# Patient Record
Sex: Female | Born: 1935 | Race: White | Hispanic: No | Smoking: Never smoker
Health system: Southern US, Community
[De-identification: ages and names within clinical notes are randomized; demographics above are authoritative.]

## PROBLEM LIST (undated history)

## (undated) DIAGNOSIS — M810 Age-related osteoporosis without current pathological fracture: Secondary | ICD-10-CM

## (undated) DIAGNOSIS — J45909 Unspecified asthma, uncomplicated: Secondary | ICD-10-CM

## (undated) DIAGNOSIS — D509 Iron deficiency anemia, unspecified: Secondary | ICD-10-CM

## (undated) DIAGNOSIS — I10 Essential (primary) hypertension: Secondary | ICD-10-CM

## (undated) DIAGNOSIS — I480 Paroxysmal atrial fibrillation: Secondary | ICD-10-CM

## (undated) DIAGNOSIS — K219 Gastro-esophageal reflux disease without esophagitis: Secondary | ICD-10-CM

## (undated) HISTORY — DX: Gastro-esophageal reflux disease without esophagitis: K21.9

## (undated) HISTORY — PX: OTHER SURGICAL HISTORY: SHX169

## (undated) HISTORY — DX: Iron deficiency anemia, unspecified: D50.9

## (undated) HISTORY — DX: Paroxysmal atrial fibrillation: I48.0

## (undated) HISTORY — DX: Essential (primary) hypertension: I10

## (undated) HISTORY — DX: Age-related osteoporosis without current pathological fracture: M81.0

---

## 2005-04-24 ENCOUNTER — Ambulatory Visit: Payer: Self-pay | Admitting: Internal Medicine

## 2005-05-03 ENCOUNTER — Ambulatory Visit: Payer: Self-pay | Admitting: Internal Medicine

## 2005-05-05 ENCOUNTER — Ambulatory Visit: Payer: Self-pay | Admitting: Unknown Physician Specialty

## 2005-06-26 ENCOUNTER — Ambulatory Visit: Payer: Self-pay | Admitting: Internal Medicine

## 2005-08-21 ENCOUNTER — Ambulatory Visit: Payer: Self-pay | Admitting: Neurosurgery

## 2005-11-23 ENCOUNTER — Ambulatory Visit: Payer: Self-pay | Admitting: Cardiology

## 2006-02-26 ENCOUNTER — Ambulatory Visit: Payer: Self-pay | Admitting: Ophthalmology

## 2006-03-05 ENCOUNTER — Ambulatory Visit: Payer: Self-pay | Admitting: Ophthalmology

## 2006-09-05 ENCOUNTER — Ambulatory Visit: Payer: Self-pay | Admitting: Internal Medicine

## 2007-11-27 ENCOUNTER — Ambulatory Visit: Payer: Self-pay | Admitting: Internal Medicine

## 2008-05-04 ENCOUNTER — Ambulatory Visit: Payer: Self-pay | Admitting: Internal Medicine

## 2008-12-25 ENCOUNTER — Ambulatory Visit: Payer: Self-pay | Admitting: Ophthalmology

## 2009-01-04 ENCOUNTER — Ambulatory Visit: Payer: Self-pay | Admitting: Ophthalmology

## 2009-03-09 ENCOUNTER — Ambulatory Visit: Payer: Self-pay | Admitting: Internal Medicine

## 2009-05-20 ENCOUNTER — Ambulatory Visit: Payer: Self-pay | Admitting: Unknown Physician Specialty

## 2010-03-01 ENCOUNTER — Encounter: Payer: Self-pay | Admitting: Internal Medicine

## 2010-03-18 ENCOUNTER — Encounter: Payer: Self-pay | Admitting: Internal Medicine

## 2010-04-07 ENCOUNTER — Ambulatory Visit: Payer: Self-pay | Admitting: Internal Medicine

## 2010-04-18 ENCOUNTER — Encounter: Payer: Self-pay | Admitting: Internal Medicine

## 2010-05-19 ENCOUNTER — Encounter: Payer: Self-pay | Admitting: Internal Medicine

## 2010-05-30 ENCOUNTER — Ambulatory Visit: Payer: Self-pay | Admitting: Internal Medicine

## 2010-06-18 ENCOUNTER — Encounter: Payer: Self-pay | Admitting: Internal Medicine

## 2010-07-19 ENCOUNTER — Encounter: Payer: Self-pay | Admitting: Internal Medicine

## 2010-08-18 ENCOUNTER — Encounter: Payer: Self-pay | Admitting: Internal Medicine

## 2010-09-18 ENCOUNTER — Encounter: Payer: Self-pay | Admitting: Internal Medicine

## 2010-10-19 ENCOUNTER — Encounter: Payer: Self-pay | Admitting: Internal Medicine

## 2011-07-19 ENCOUNTER — Ambulatory Visit: Payer: Self-pay | Admitting: Internal Medicine

## 2012-07-24 ENCOUNTER — Ambulatory Visit: Payer: Self-pay | Admitting: Internal Medicine

## 2013-07-30 ENCOUNTER — Ambulatory Visit: Payer: Self-pay | Admitting: Internal Medicine

## 2013-11-16 ENCOUNTER — Inpatient Hospital Stay: Payer: Self-pay | Admitting: Orthopedic Surgery

## 2013-11-16 LAB — URINALYSIS, COMPLETE
BILIRUBIN, UR: NEGATIVE
BLOOD: NEGATIVE
Glucose,UR: NEGATIVE mg/dL (ref 0–75)
Ketone: NEGATIVE
Nitrite: NEGATIVE
PH: 6 (ref 4.5–8.0)
PROTEIN: NEGATIVE
RBC,UR: 1 /HPF (ref 0–5)
Specific Gravity: 1.012 (ref 1.003–1.030)

## 2013-11-16 LAB — BASIC METABOLIC PANEL
ANION GAP: 4 — AB (ref 7–16)
BUN: 16 mg/dL (ref 7–18)
CHLORIDE: 104 mmol/L (ref 98–107)
CO2: 27 mmol/L (ref 21–32)
CREATININE: 0.7 mg/dL (ref 0.60–1.30)
Calcium, Total: 9.2 mg/dL (ref 8.5–10.1)
Glucose: 96 mg/dL (ref 65–99)
Osmolality: 271 (ref 275–301)
POTASSIUM: 4.2 mmol/L (ref 3.5–5.1)
Sodium: 135 mmol/L — ABNORMAL LOW (ref 136–145)

## 2013-11-16 LAB — PROTIME-INR
INR: 1.1
Prothrombin Time: 13.9 secs (ref 11.5–14.7)

## 2013-11-16 LAB — CBC
HCT: 31.9 % — AB (ref 35.0–47.0)
HGB: 10.9 g/dL — AB (ref 12.0–16.0)
MCH: 32.1 pg (ref 26.0–34.0)
MCHC: 34.1 g/dL (ref 32.0–36.0)
MCV: 94 fL (ref 80–100)
Platelet: 254 10*3/uL (ref 150–440)
RBC: 3.39 10*6/uL — ABNORMAL LOW (ref 3.80–5.20)
RDW: 14.4 % (ref 11.5–14.5)
WBC: 7.9 10*3/uL (ref 3.6–11.0)

## 2013-11-16 LAB — IRON AND TIBC
IRON BIND. CAP.(TOTAL): 401 ug/dL (ref 250–450)
Iron Saturation: 29 %
Iron: 115 ug/dL (ref 50–170)
UNBOUND IRON-BIND. CAP.: 286 ug/dL

## 2013-11-16 LAB — APTT: Activated PTT: 27 secs (ref 23.6–35.9)

## 2013-11-17 LAB — CBC WITH DIFFERENTIAL/PLATELET
Basophil #: 0 10*3/uL (ref 0.0–0.1)
Basophil %: 0.2 %
Eosinophil #: 0 10*3/uL (ref 0.0–0.7)
Eosinophil %: 0 %
HCT: 28.3 % — ABNORMAL LOW (ref 35.0–47.0)
HGB: 9.7 g/dL — ABNORMAL LOW (ref 12.0–16.0)
Lymphocyte #: 0.6 10*3/uL — ABNORMAL LOW (ref 1.0–3.6)
Lymphocyte %: 7.7 %
MCH: 32.3 pg (ref 26.0–34.0)
MCHC: 34.4 g/dL (ref 32.0–36.0)
MCV: 94 fL (ref 80–100)
Monocyte #: 0.7 x10 3/mm (ref 0.2–0.9)
Monocyte %: 8.6 %
NEUTROS ABS: 6.5 10*3/uL (ref 1.4–6.5)
Neutrophil %: 83.5 %
PLATELETS: 231 10*3/uL (ref 150–440)
RBC: 3.01 10*6/uL — ABNORMAL LOW (ref 3.80–5.20)
RDW: 14.8 % — AB (ref 11.5–14.5)
WBC: 7.8 10*3/uL (ref 3.6–11.0)

## 2013-11-17 LAB — BASIC METABOLIC PANEL
Anion Gap: 6 — ABNORMAL LOW (ref 7–16)
BUN: 12 mg/dL (ref 7–18)
CHLORIDE: 104 mmol/L (ref 98–107)
Calcium, Total: 8.5 mg/dL (ref 8.5–10.1)
Co2: 27 mmol/L (ref 21–32)
Creatinine: 0.78 mg/dL (ref 0.60–1.30)
EGFR (African American): >60
GLUCOSE: 138 mg/dL — AB (ref 65–99)
OSMOLALITY: 276 (ref 275–301)
POTASSIUM: 3.9 mmol/L (ref 3.5–5.1)
Sodium: 137 mmol/L (ref 136–145)

## 2013-11-17 LAB — URINE CULTURE

## 2013-11-19 ENCOUNTER — Encounter: Payer: Self-pay | Admitting: Internal Medicine

## 2013-12-17 ENCOUNTER — Encounter: Payer: Self-pay | Admitting: Internal Medicine

## 2014-02-10 ENCOUNTER — Encounter: Payer: Self-pay | Admitting: Orthopedic Surgery

## 2014-02-16 ENCOUNTER — Encounter: Payer: Self-pay | Admitting: Orthopedic Surgery

## 2014-02-25 ENCOUNTER — Ambulatory Visit: Payer: Self-pay | Admitting: Orthopedic Surgery

## 2014-03-18 ENCOUNTER — Encounter: Payer: Self-pay | Admitting: Orthopedic Surgery

## 2014-04-18 ENCOUNTER — Encounter: Payer: Self-pay | Admitting: Orthopedic Surgery

## 2014-05-19 ENCOUNTER — Encounter: Payer: Self-pay | Admitting: Orthopedic Surgery

## 2014-09-30 ENCOUNTER — Ambulatory Visit: Payer: Self-pay | Admitting: Family Medicine

## 2015-01-09 NOTE — Discharge Summary (Signed)
PATIENT NAME:  Joan Ingram, Joan Ingram MR#:  [id number] DATE OF BIRTH:  [birth year]  DATE OF ADMISSION:  11/16/2013 DATE OF DISCHARGE:  11/19/2013  ADMITTING DIAGNOSIS: Left ankle fracture-dislocation.  HISTORY OF PRESENT ILLNESS: Joan Ingram is an 81 year old female who slipped and fell walking under a back deck. She slipped on the ice and sustained an injury to her left lower extremity. She had deformity and pain and swelling and was brought to the [facility] Emergency Department where she was diagnosed with a trimalleolar ankle fracture-dislocation. She was admitted to the orthopedic surgery service for further evaluation and management.   PAST MEDICAL HISTORY: Asthma.   ALLERGIES: INCLUDE ASPIRIN, SULFA, CODEINE, BENADRYL, MACROBID, DOXYCYCLINE, LEVAQUIN, PENICILLIN AND CIPRO.   HOME MEDICATIONS: Include albuterol 1 spray p.r.n. as needed for wheezing and vitamin D3 1 tablet daily.   HOSPITAL COURSE: The patient was admitted on 11/16/2013. She was seen by the hospitalist service and cleared for surgery. The patient was then brought to the operating room and underwent an uncomplicated open reduction and internal fixation of her left ankle fracture including open reduction and internal fixation of the medial and lateral malleoli.  Postoperatively, the patient was brought to the orthopedic floor. She remained on postoperative antibiotics for 24 hours. She had a Foley catheter, which was removed on postoperative day #2.  She was started on Lovenox for DVT prophylaxis. She had physical and occupational therapy called for postoperative day #1.  Postoperative labs were checked and remained stable. The patient was doing so well that the hospitalist service signed off after postoperative day #1. The patient continued to improve very well clinically. There were no acute postoperative complications. She remained in the AO splint which remained clean, dry and intact. She has baseline neuropathy, but was  neurovascularly intact on the operative leg throughout her hospitalization. She made excellent progress with physical therapy. Her pain was well controlled throughout the hospitalization. Given her clinical improvement, she was prepared for discharge on postoperative day #3.  DISCHARGE INSTRUCTIONS: The patient will be discharged to [facility] with instructions to remain nonweightbearing on the left lower extremity. She will elevate and ice her lower extremity whenever possible. The patient will remain on Lovenox for deep vein thrombosis prophylaxis. She will follow up with me in 7 to 10 days for wound check and staple removal. She will be converted from a splint to a cast at that visit.   DISCHARGE MEDICATIONS:  Include home albuterol and vitamin D3 as mentioned above. Norco 5/325 mg tablets to take 1 to 2 tabs every 4 to 6 hours p.r.n. for pain, a prescription for this was left on the chart.  Lovenox 30 mg subcutaneous b.i.d. and calcium/vitamin D 500mg / 200 international units 1 tab b.i.d. with meals.   ____________________________ Timoteo Gaul, MD klk:ce D: 11/19/2013 15:14:22 ET T: 11/19/2013 15:45:10 ET JOB#: [id number]  cc: Timoteo Gaul, MD, <Dictator> Timoteo Gaul MD ELECTRONICALLY SIGNED 11/20/2013 22:54

## 2015-01-09 NOTE — Consult Note (Signed)
Brief Consult Note: Diagnosis: ANKLE FRACTURE / ASTHMA.   Patient was seen by consultant.   Consult note dictated.   Recommend to proceed with surgery or procedure.   Orders entered.   Discussed with Attending MD.   Comments: 80 YO FEMALE CLEARED FOR SURGERY SEE NOTE.  Electronic Signatures: James Ivanoff, Roselie Awkward (MD)  (Signed 01-Mar-15 12:58)  Authored: Brief Consult Note   Last Updated: 01-Mar-15 12:58 by James Ivanoff, Roselie Awkward (MD)

## 2015-01-09 NOTE — H&P (Signed)
Subjective/Chief Complaint Left ankle fracture dislocation   History of Present Illness 81 y/o female slipped and fell coming out of her house onto the back porch.  She fell backwards and injured her left ankle and left middle finger.  She is currently alert and able to provide an accurate history.  Her daughter and son in law are in the room.  She is currently comfortable.  She denies any numbness or tingling in the left leg.  She denies any other injuries or LOC.   Past Med/Surgical Hx:  Asthma:   ALLERGIES:  Aspirin: Unknown  Sulfa: Unknown  Codeine: Unknown  Benadryl: Unknown  Macrobid: Unknown  Doxycycline: Unknown  Levaquin: Unknown  Penicillin: Unknown  Cipro: Unknown  HOME MEDICATIONS: Medication Instructions Status  albuterol 1 spray(s) inhaled prn, As Needed - for Shortness of Breath, for Wheezing  Active  Vitamin D3 1 cap(s) orally once a day Active   Family and Social History:  Family History Non-Contributory   Social History negative tobacco   Place of Living Home   Review of Systems:  Subjective/Chief Complaint Left ankle pain   Physical Exam:  GEN no acute distress   HEENT PERRL, hearing intact to voice, moist oral mucosa, Oropharynx clear, good dentition   NECK supple  No masses   RESP normal resp effort  clear BS  no use of accessory muscles   CARD regular rate   ABD denies tenderness  soft  normal BS  no Adominal Mass   LYMPH negative neck   EXTR Left ankle skin is intact.  + swelling, but leg compartments are soft and compressible.  She has intact sensation to light touch and pedal pules are palpable.  She can flex and extend toes.  There is an extension deformity at the ankle.   SKIN normal to palpation   NEURO motor/sensory function intact   PSYCH A+O to time, place, person   Lab Results: Routine Chem:  01-Mar-15 10:22   Glucose, Serum 96  BUN 16  Creatinine (comp) 0.70  Sodium, Serum  135  Potassium, Serum 4.2  Chloride, Serum  104  CO2, Serum 27  Calcium (Total), Serum 9.2  Anion Gap  4  Osmolality (calc) 271  eGFR (African American) >60  eGFR (Non-African American) >60 (eGFR values <49m/min/1.73 m2 may be an indication of chronic kidney disease (CKD). Calculated eGFR is useful in patients with stable renal function. The eGFR calculation will not be reliable in acutely ill patients when serum creatinine is changing rapidly. It is not useful in  patients on dialysis. The eGFR calculation may not be applicable to patients at the low and high extremes of body sizes, pregnant women, and vegetarians.)  Routine Coag:  01-Mar-15 10:22   Activated PTT (APTT) 27.0 (A HCT value >55% may artifactually increase the APTT. In one study, the increase was an average of 19%. Reference: "Effect on Routine and Special Coagulation Testing Values of Citrate Anticoagulant Adjustment in Patients with High HCT Values." American Journal of Clinical Pathology 2006;126:400-405.)  Prothrombin 13.9  INR 1.1 (INR reference interval applies to patients on anticoagulant therapy. A single INR therapeutic range for coumarins is not optimal for all indications; however, the suggested range for most indications is 2.0 - 3.0. Exceptions to the INR Reference Range may include: Prosthetic heart valves, acute myocardial infarction, prevention of myocardial infarction, and combinations of aspirin and anticoagulant. The need for a higher or lower target INR must be assessed individually. Reference: The Pharmacology and Management of  the Vitamin K  antagonists: the seventh ACCP Conference on Antithrombotic and Thrombolytic Therapy. OHYWV.3710 Sept:126 (3suppl): [id number]. A HCT value >55% may artifactually increase the PT.  In one study,  the increase was an average of 25%. Reference:  "Effect on Routine and Special Coagulation Testing Values of Citrate Anticoagulant Adjustment in Patients with High HCT Values." American Journal of Clinical  Pathology 2006;126:400-405.)  Routine Hem:  01-Mar-15 10:22   WBC (CBC) 7.9  RBC (CBC)  3.39  Hemoglobin (CBC)  10.9  Hematocrit (CBC)  31.9  Platelet Count (CBC) 254 (Result(s) reported on 16 Nov 2013 at 10:43AM.)  MCV 94  MCH 32.1  MCHC 34.1  RDW 14.4   Radiology Results: XRay:    01-Mar-15 10:16, Ankle Left Complete  Ankle Left Complete  REASON FOR EXAM:    pain following fall  COMMENTS:       PROCEDURE: DXR - DXR ANKLE LEFT COMPLETE  - Nov 16 2013 10:16AM     CLINICAL DATA:  Left ankle pain post fall    EXAM:  LEFT ANKLE COMPLETE - 3+ VIEW    COMPARISON:  None.    FINDINGS:  Three views of the left ankle submitted. There is ankle fracture  dislocation with anterior displacement of distal tibia and fibula.  There is displaced fracture of distal fibula lateral malleolus. Mild  displaced fracture of distal tibia medial malleolus. Mild displaced  fracture of distal tibia posterior malleolus. Small plantar spur of  calcaneus.     IMPRESSION:  Trimalleolar fracture with ankle dislocation.      Electronically Signed    By: Lahoma Crocker M.D.    On: 11/16/2013 10:30         Verified By: Ephraim Hamburger, M.D.,    01-Mar-15 10:16, Finger-Middle (3rd Digit) Left Hand  Finger-Middle (3rd Digit) Left Hand  REASON FOR EXAM:    pain following fall  COMMENTS:       PROCEDURE: DXR - DXR FINGER MID 3RD DIGIT LT HAND  - Nov 16 2013 10:16AM     CLINICAL DATA:  Post fall, now with middle finger pain    EXAM:  LEFT MIDDLE FINGER 2+V    COMPARISON:  None.    FINDINGS:  No fracture or dislocation. There is mild degenerative change of the  PIP joint with joint space loss, subchondral sclerosis minimal  osteophytosis. Regional soft tissues appear normal. No radiopaque  foreign body.     IMPRESSION:  1. No acutefindings.  2. Mild degenerative change of the PIP joint of the middle digit.      Electronically Signed    By: Sandi Mariscal M.D.    On: 11/16/2013 10:28          Verified By: Aileen Fass, M.D.,  [place]:    01-Mar-15 10:16, Ankle Left Complete  PACS Image    01-Mar-15 10:16, Finger-Middle (3rd Digit) Left Hand  PACS Image    Assessment/Admission Diagnosis Left ankle trimalleolar fracture dislocation   Plan I have explained the injury to the patient.  I am recommending ORIF for her fracture.  But more accutely she need a closed reduction and splinting while we await surgery.  I have reviewed the risks and benefits of surgery with the patient.  The risks and benefits of surgical intervention were discussed in detail with the patient. The patient expressed understanding of the risks and benefits and agreed with plans for surgery.  The risks include, but are not limited to: infection,  bleeding requiring transfusion, nerve and blood vessel injury (especially the superficial peroneal nerve leading to dorsal foot numbness), refracture, malunion, nonunion, persistent ankle pain and instability, post-traumatic arthritis, painful hardware, hardware failure and the need for more surgery including.  Medical risks include DVT, and PE, MI, pneumonia, stroke, respiratory failure and death.  Surgical site signed as per "right site surgery" protocol. Patient is NPO.  She is awaiting medical clearance for surgery.  Plan for surgery later today.  I have answered all the patient's questions regarding the surgery, its risks and the post-operative course.   Electronic Signatures: Thornton Park (MD)  (Signed 01-Mar-15 12:03)  Authored: CHIEF COMPLAINT and HISTORY, PAST MEDICAL/SURGIAL HISTORY, ALLERGIES, HOME MEDICATIONS, FAMILY AND SOCIAL HISTORY, REVIEW OF SYSTEMS, PHYSICAL EXAM, LABS, Radiology, ASSESSMENT AND PLAN   Last Updated: 01-Mar-15 12:03 by Thornton Park (MD)

## 2015-01-09 NOTE — Op Note (Signed)
PATIENT NAME:  Joan Ingram, Joan Ingram MR#:  [id number] DATE OF BIRTH:  [birth year]  DATE OF PROCEDURE:  11/16/2013  PREOPERATIVE DIAGNOSIS:  Fracture-dislocation of left trimalleolar ankle fracture.   POSTOPERATIVE DIAGNOSIS:  Fracture-dislocation of left trimalleolar ankle fracture.  OPERATION:  Open reduction and internal fixation of medial and lateral malleolar fractures with repair of the anterior and inferior tibiofibular ligament avulsion fracture from the anterior aspect of the fibula.   ANESTHESIA:  General.   SURGEON:  Thornton Park, M.D.   ESTIMATED BLOOD LOSS:  25 mL.   TOURNIQUET TIME:  128 minutes.   IMPLANTS:  Synthes seven hole one-third tubular plate, Synthes 4-0 cannulated screws x 3 for medial malleolar fixation.   INDICATION FOR THE PROCEDURE:  The patient is an 81 year old female who sustained a fall after slipping on the ice at home.  She was brought to the [facility] Emergency Department where she was diagnosed with an ankle fracture-dislocation.  I performed a closed reduction and splinting in the Emergency Room.  Given the instability of the ankle, I recommended open reduction internal fixation.  I reviewed the risks and benefits of surgery with the patient.  She understood these risks and wished to proceed.   PROCEDURE NOTE:  The patient was brought to the Operating Room after I had marked the left ankle with the word "yes" according to the hospital's right site protocol.  She was placed supine on the operative table.  All bony prominences were adequately padded.  She underwent general anesthesia.  She was then prepped and draped in a sterile fashion.  A timeout was performed to verify the patient's name, date of birth, medical record number, correct site of surgery, and correct procedure to be performed.  It was also used to verify the patient had received antibiotics and that all appropriate instruments, implants, and radiographic studies were available in the room.   Once all in attendance, were in agreement, the case began.   Initial FluoroScan images were taken which allowed for incisional planning.  The lateral portion of the ankle was addressed first.  A longitudinal incision was made in line with the fibula.  The subcutaneous tissues were dissected using Metzenbaum scissor and pick-up.  Care was taken to avoid injury to the superficial peroneal nerve.  The fracture was then identified.  The fracture edges were cleared of overlying soft tissue along the lateral cortex with a periosteal elevator.  The fracture was then manually reduced.  A Synthes seven hole one-third tubular plate was then contoured to the lateral malleolus and placed alongside the distal fibula.  Its position was confirmed on AP and lateral FluoroScan images.  Prior to plate placement, however a single lag screw was placed across the fracture.  This was a Synthes 3.5 mm bicortical screw.  The plate was used in a neutralization fashion.  Three screws proximal and three screws distal to the fracture were placed.  The proximal screws were 3.5 mm bicortical screws x 3.  The distal screws were two fully threaded screws in a single locking screw.  Once the plate was in position again FluoroScan images were taken in both the AP and lateral planes to confirm adequate fracture reduction and plate placement.  The attention was then turned to a small anterior avulsion fracture off the fibula with a portion of the anterior tibial fibular ligament attached to it.  A small drill hole was placed into the fibula.  This allowed for passage of a 0 Vicryl suture through  the avulsion fragment and through the drill hole in the fibula to allow for anatomic reduction in this avulsion fracture fragment.  0 Vicryl was tied down for reduction of the fracture fragment.   The attention was then turned to the medial side of the ankle.  A 3 cm incision was made at the center in the midline of the medial malleolus.  Soft tissues  again were carefully dissected using a Metzenbaum scissor and pick-up.  The medial malleolar fracture was found to be significantly comminuted.   The fracture was reduced and held in position with a dental pick.  Three K wires placed in parallel were advanced through the distal medial malleolar fragment across the fracture site and into the distal tibia.  These were then measured with a depth gauge and overdrilled.  Three 4-0 cannulated screws were then advanced into position by hand.  These were long threaded screws.  This allowed for a near anatomic reduction of the fracture.  The wound was copiously irrigated.  The subcutaneous tissues were closed with 2-0 Vicryl and the skin approximated with staples.  The attention was then turned to closure on the lateral side.   The wound was copiously irrigated again with GU impregnated saline.  The deep fascia was closed with 0 Vicryl, the subcutaneous tissue was closed with 2-0 Vicryl and the skin approximated with staples.  The posterior malleolar fragment was less than 20% of the articular surface of the tibia.  Therefore, the decision was made not to fix this fracture.  There was also no evidence of instability on stress testing and with ankle movement under fluoro in the Operating Room to suggest that it needed fixation.  Medial stress testing also did not show any instability of the syndesmosis.   The patient was then placed in an AO split.  She was awoken and brought to the PACU in stable condition.  I was scrubbed and present for the entire case and all sharp and instrument counts were correct at the conclusion of the case.  I spoke with the patient's daughter and her son-in-law in the postoperative waiting room to let them know the case had gone without complication.  The patient was stable in the recovery room.     ____________________________ Timoteo Gaul, MD klk:ea D: 11/21/2013 00:05:01 ET T: 11/21/2013 00:32:35 ET JOB#: [id number]  cc: Timoteo Gaul, MD, <Dictator> Timoteo Gaul MD ELECTRONICALLY SIGNED 11/24/2013 13:12
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
## 2015-08-21 IMAGING — CR DG ANKLE COMPLETE 3+V*L*
1 series · 3 of 3 positions shown · non-contrast
Comparison: Prior film same day

CLINICAL DATA: Postreduction

EXAM:
LEFT ANKLE COMPLETE - 3+ VIEW

[Series 1: ap · 0.17mm/px · 3 of 3 slices shown]
[im 1/3]
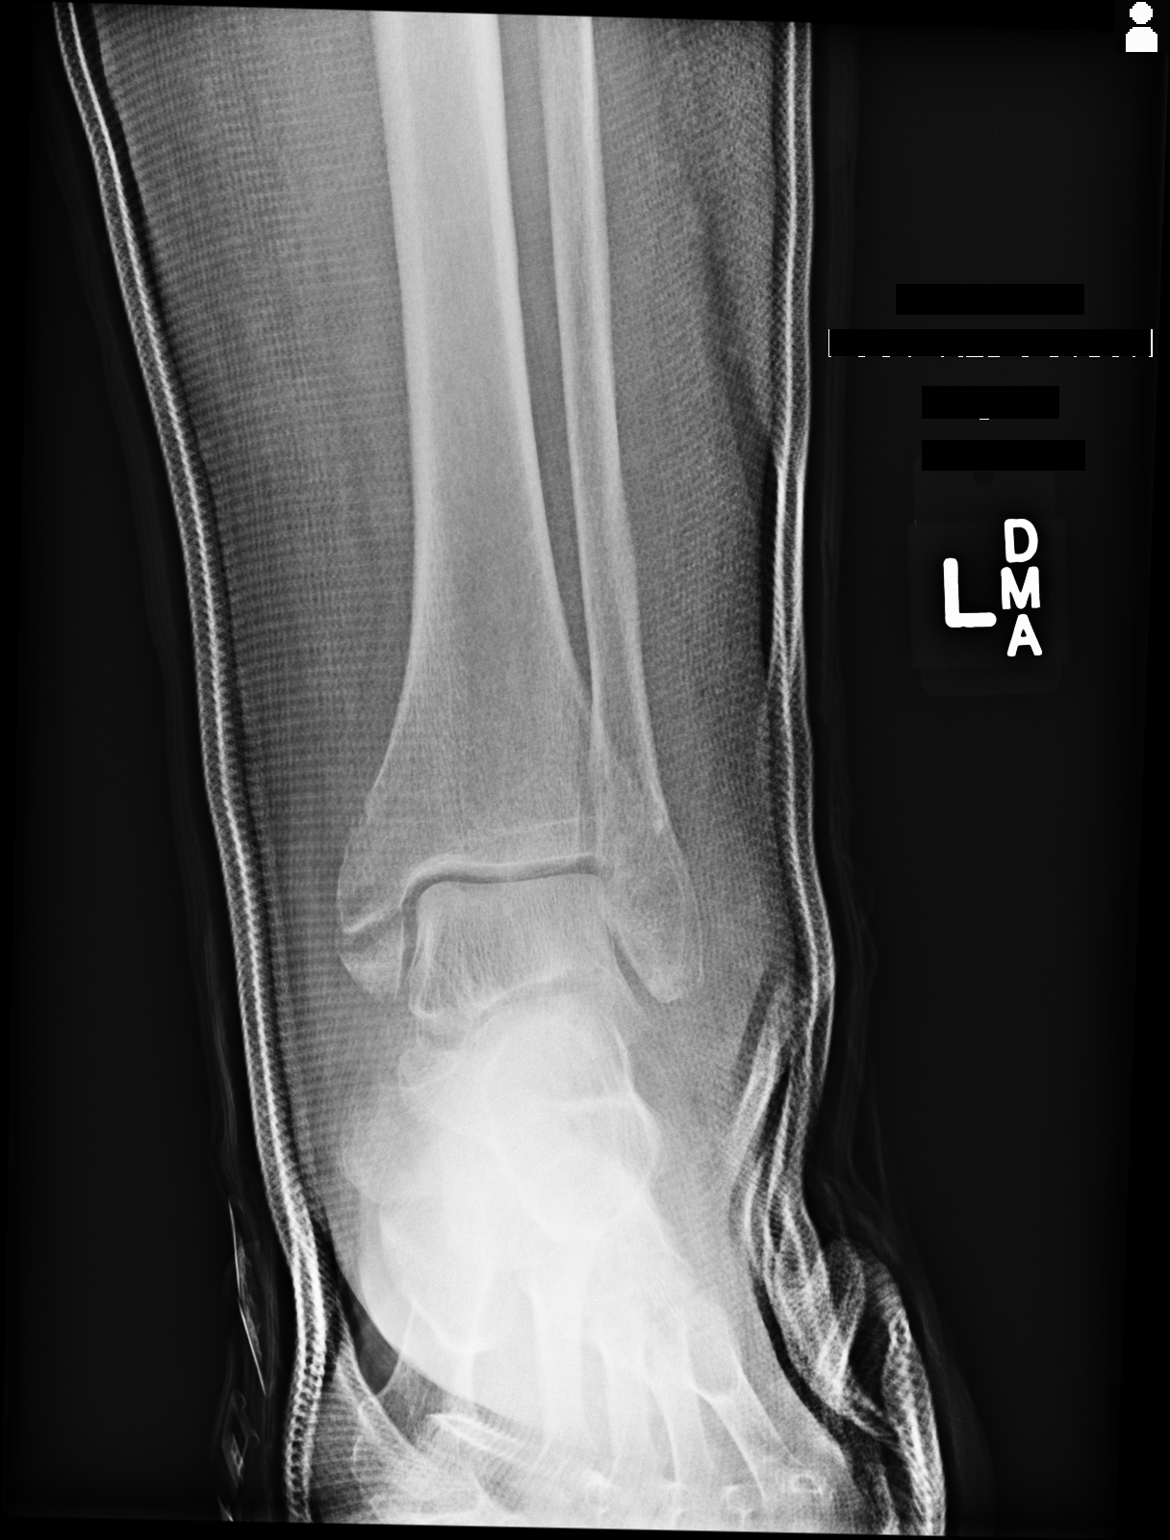
[im 2/3]
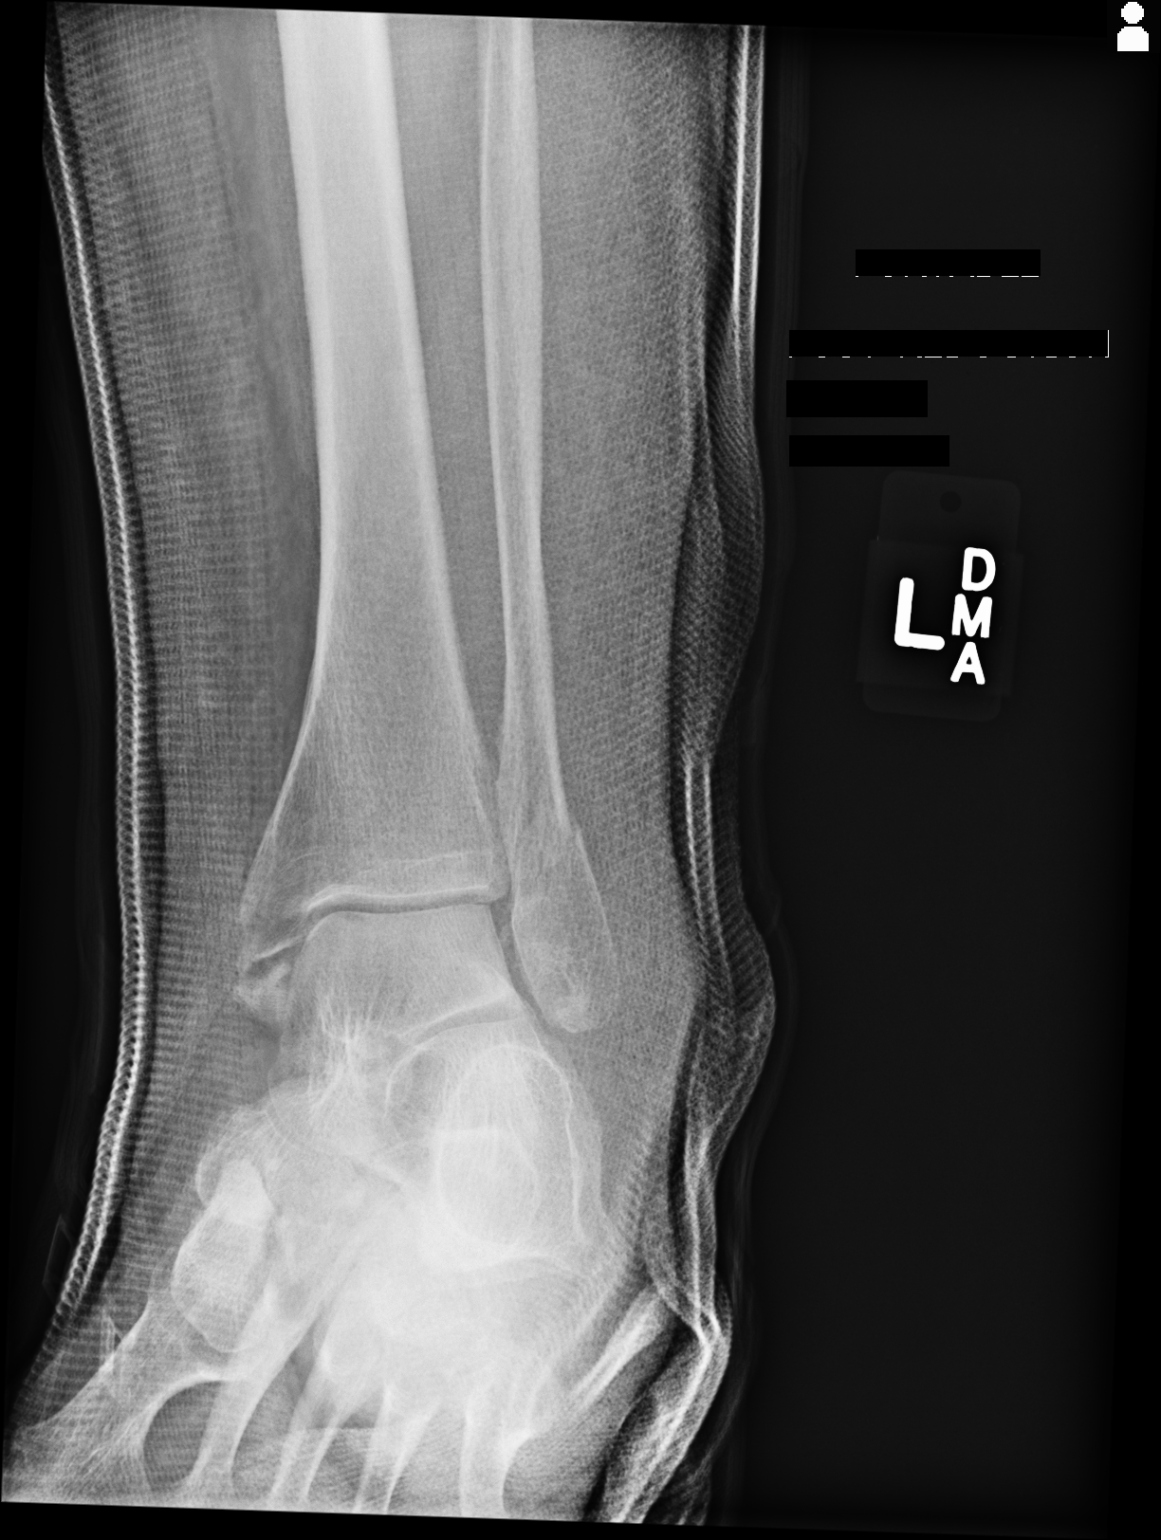
[im 3/3]
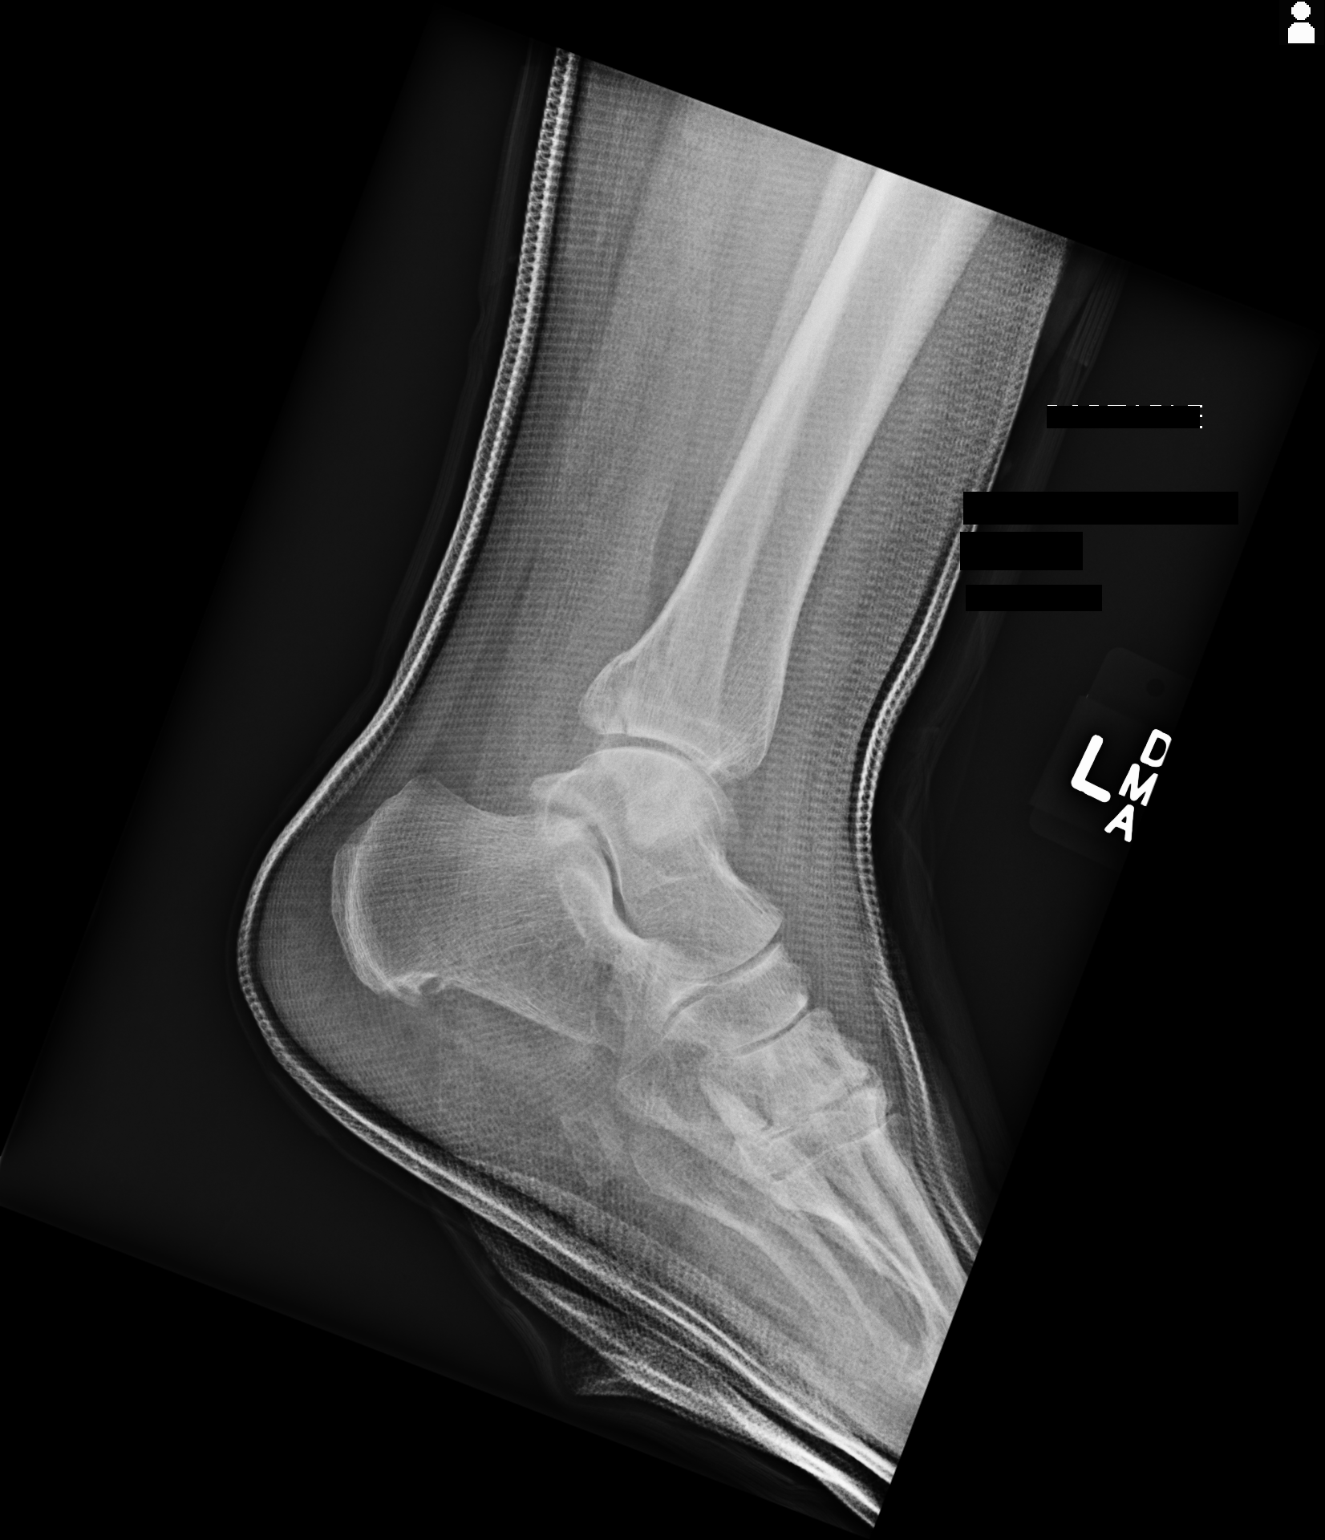

[3 of 3 positions shown; findings below may reference images not displayed]

FINDINGS: Three views of the left ankle submitted. Again noted fractures in
distal tibia and fibula. Postreduction there is significant
improvement in alignment. Ankle mortise is restored. Casting
material artifact are noted.
IMPRESSION: Again noted fractures in distal tibia and fibula. Postreduction
there is significant improvement in alignment. Ankle mortise is
restored. Casting material artifacts are noted.

## 2015-08-31 IMAGING — CR LEFT MIDDLE FINGER 2+V
1 series · 3 of 3 positions shown · non-contrast
Comparison: None.

CLINICAL DATA: Post fall, now with middle finger pain

EXAM:
LEFT MIDDLE FINGER 2+V

[Series 1: pa · 0.17mm/px · 3 of 3 slices shown]
[im 1/3]
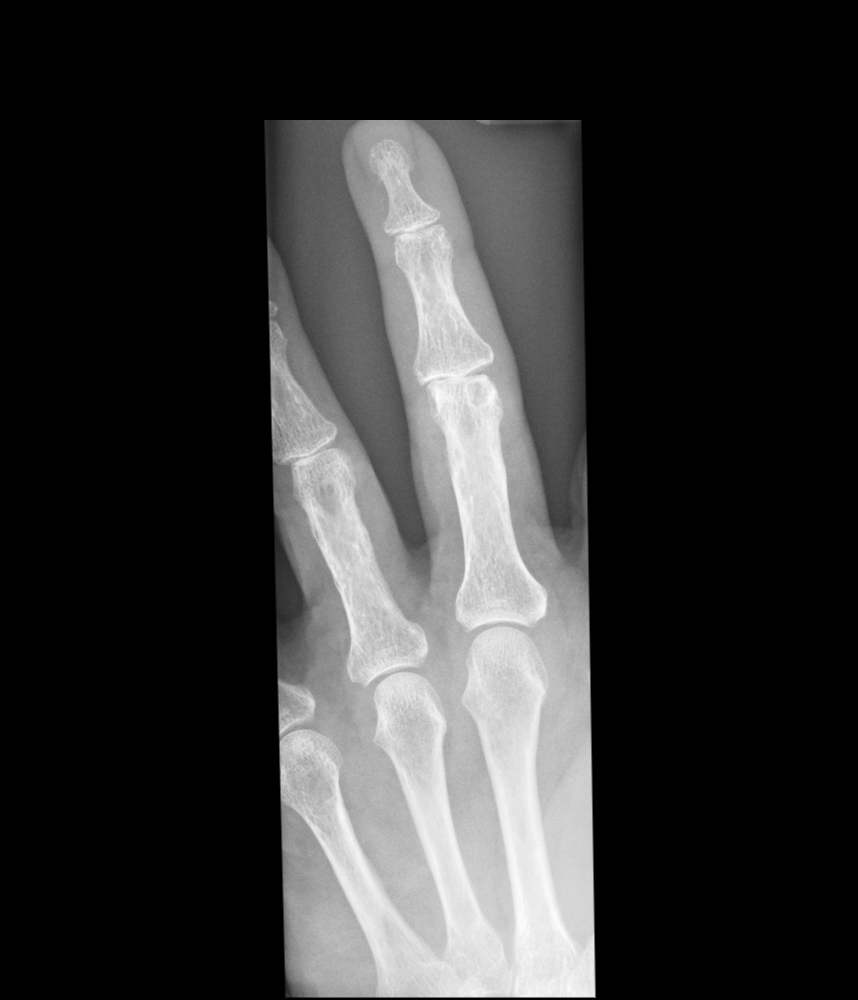
[im 2/3]
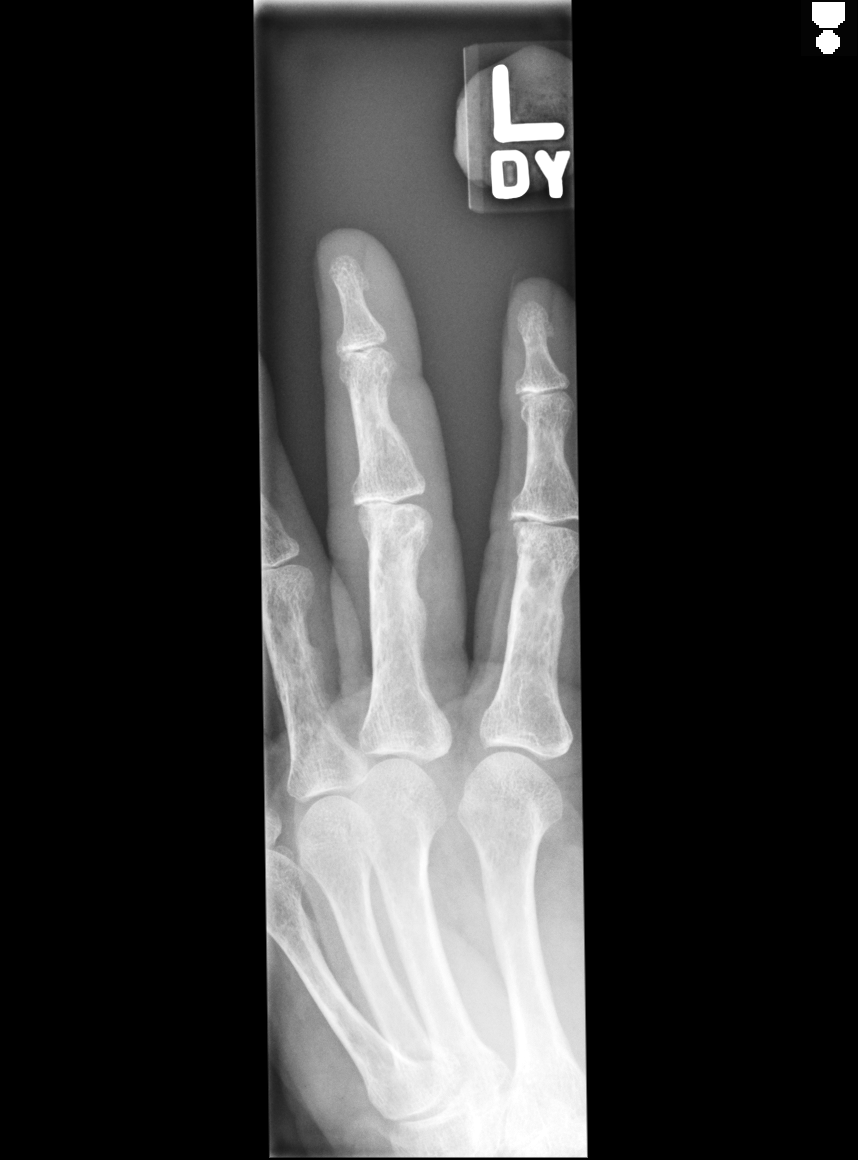
[im 3/3]
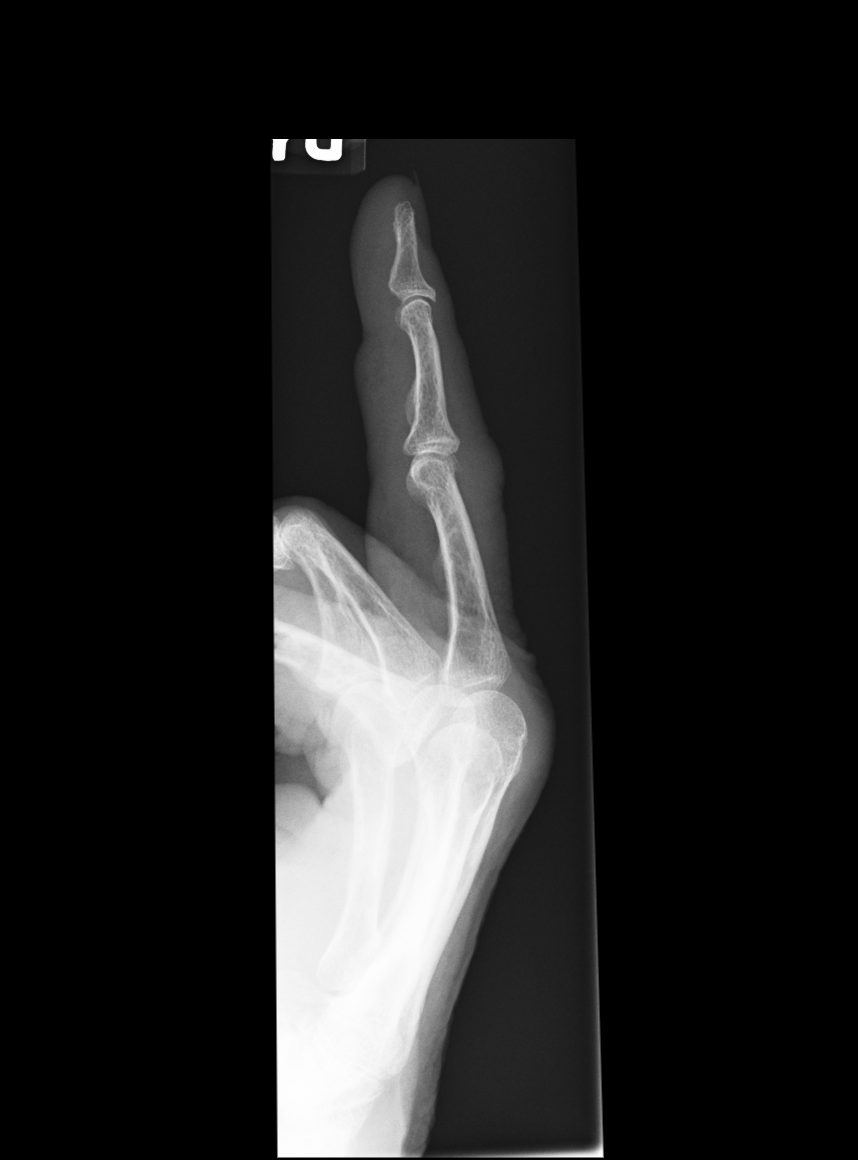

[3 of 3 positions shown; findings below may reference images not displayed]

FINDINGS: No fracture or dislocation. There is mild degenerative change of the
PIP joint with joint space loss, subchondral sclerosis minimal
osteophytosis. Regional soft tissues appear normal. No radiopaque
foreign body.
IMPRESSION: 1. No acute findings.
2. Mild degenerative change of the PIP joint of the middle digit.

## 2015-09-23 DIAGNOSIS — Z961 Presence of intraocular lens: Secondary | ICD-10-CM | POA: Diagnosis not present

## 2015-11-30 IMAGING — US US EXTREM LOW VENOUS*L*
1 series · 13 of 24 positions shown · non-contrast
Comparison: None.

CLINICAL DATA: Left lower extremity swelling and pain.



[Series 1: us extrem low venous*left* · 0.07mm/px · 13 of 31 slices shown]
[im 1/31]
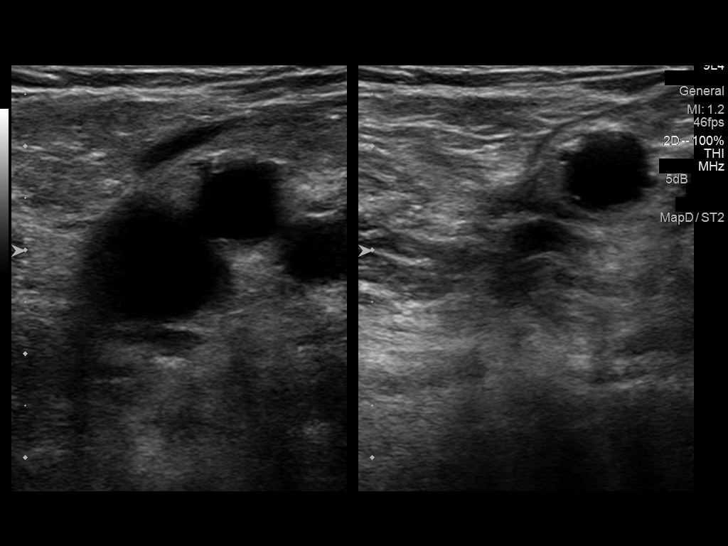
[im 3/31]
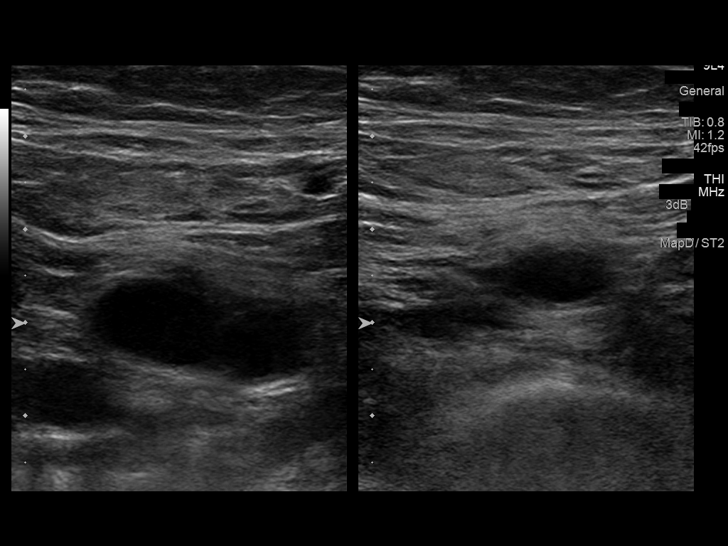
[im 6/31]
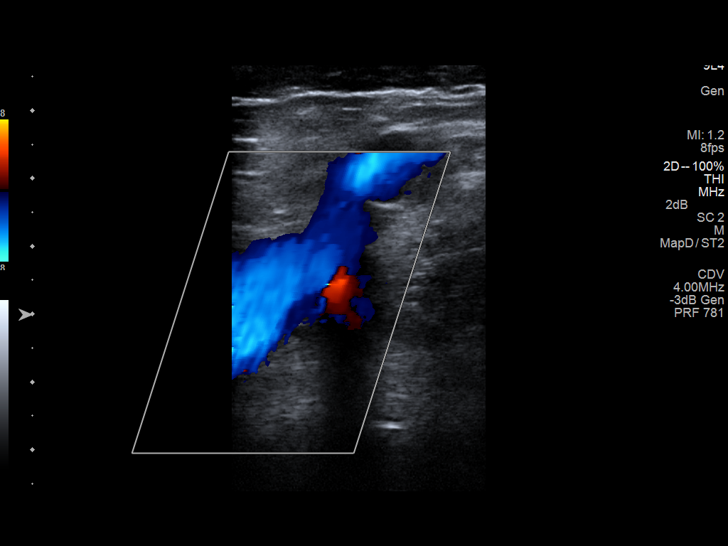
[im 8/31]
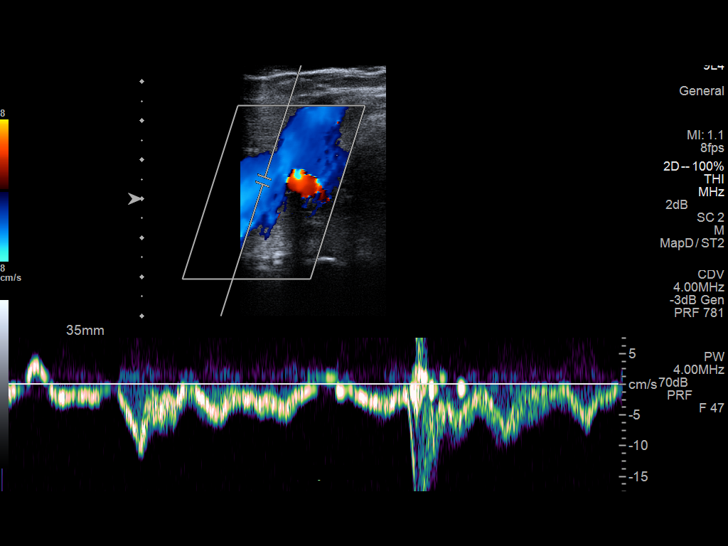
[im 11/31]
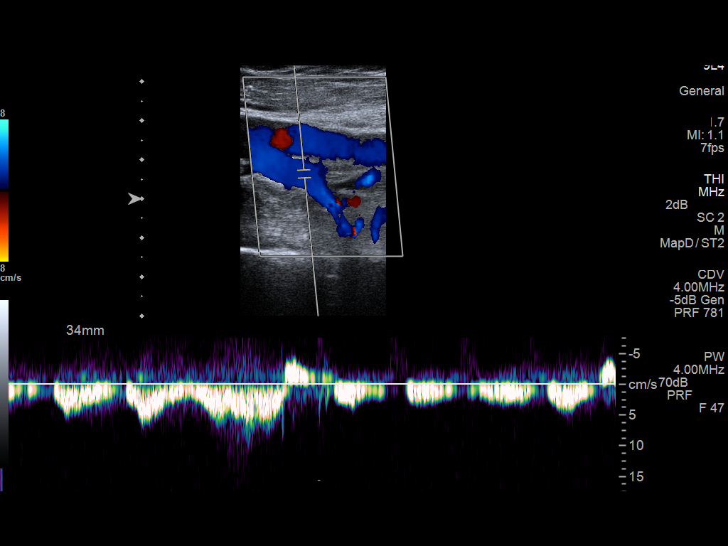
[im 14/31]
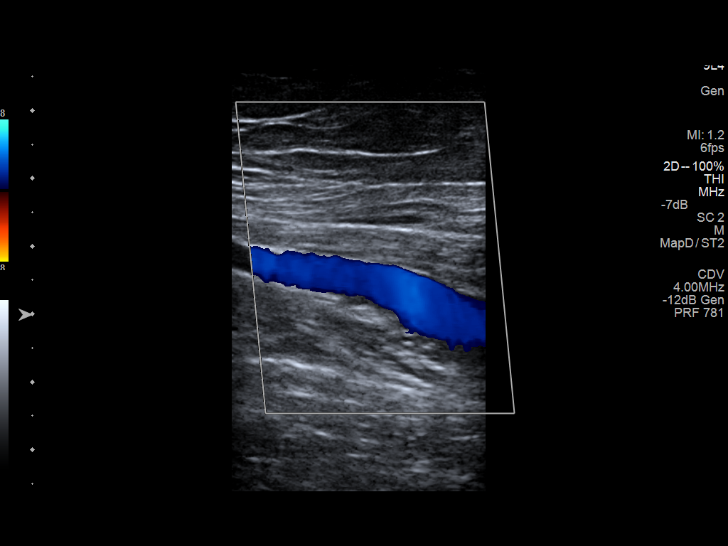
[im 16/31]
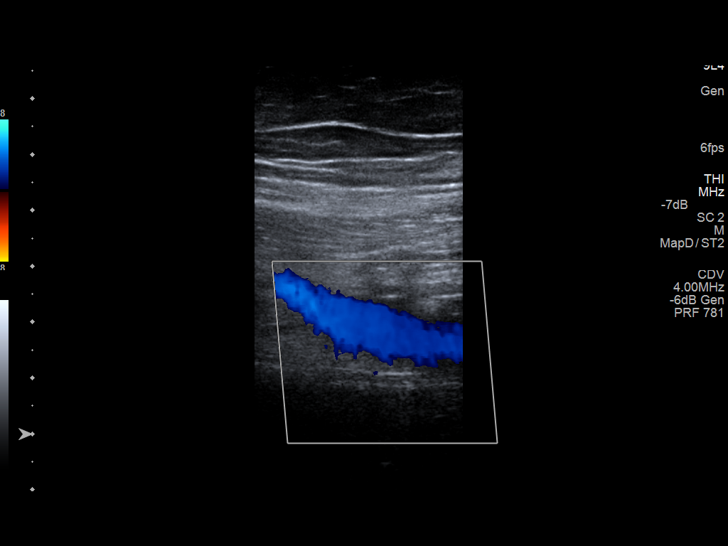
[im 17/31]
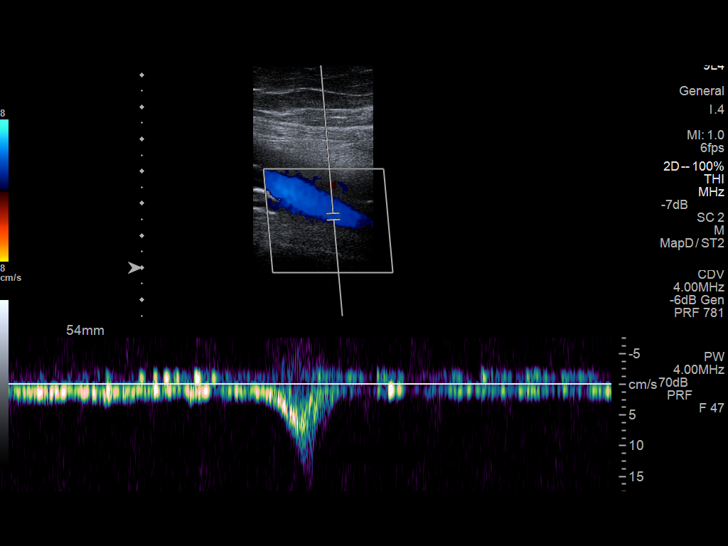
[im 20/31]
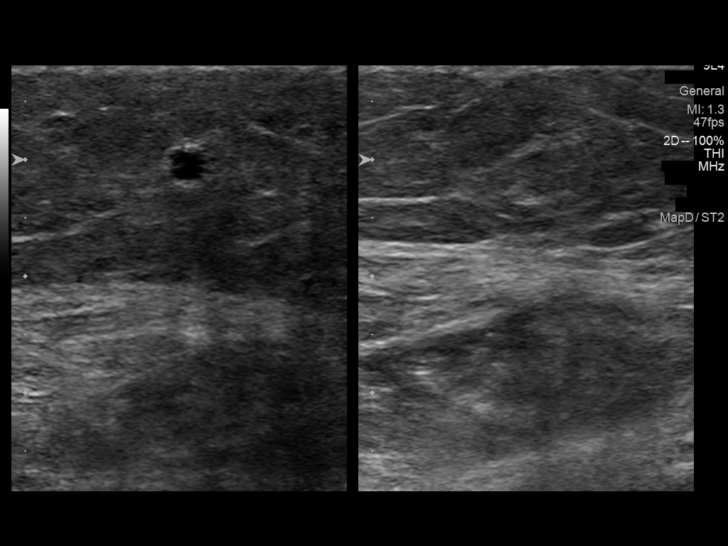
[im 23/31]
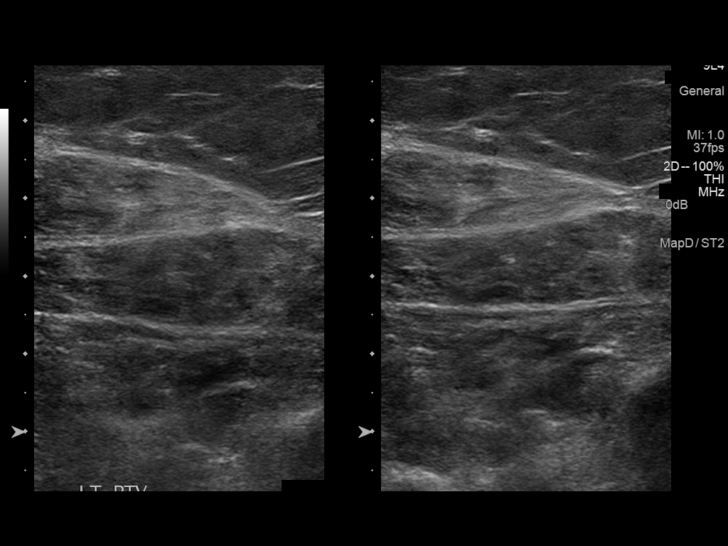
[im 25/31]
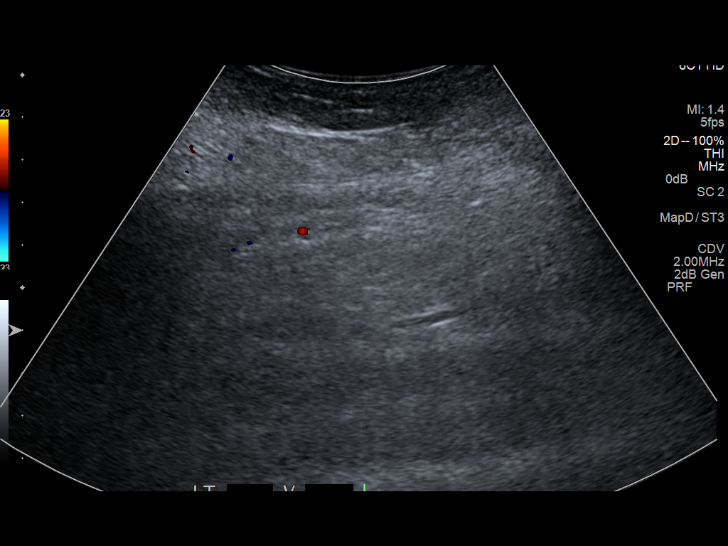
[im 28/31]
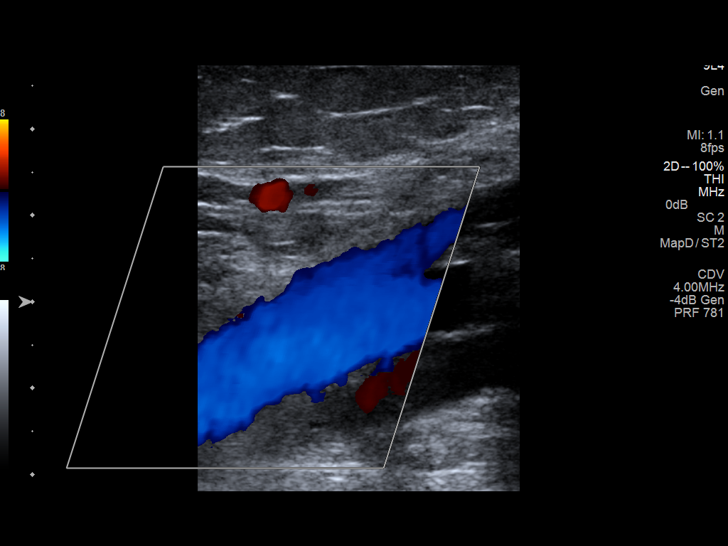
[im 31/31]
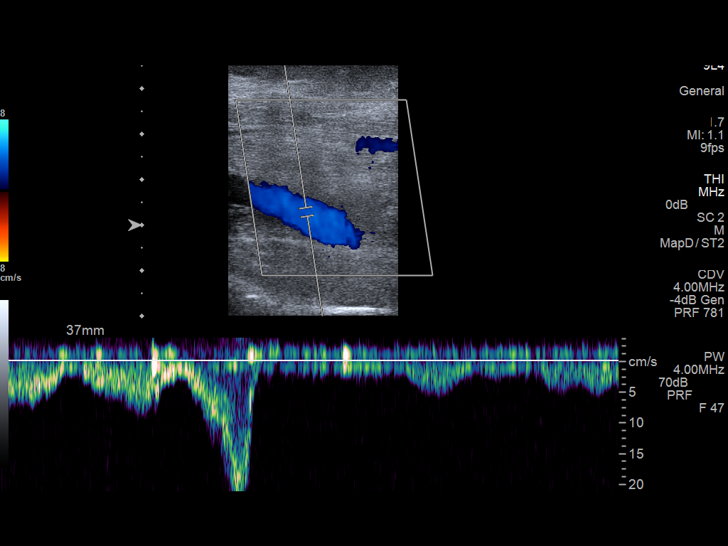

[13 of 24 positions shown; findings below may reference images not displayed]

FINDINGS: Common Femoral Vein: No evidence of thrombus. Normal
compressibility, respiratory phasicity and response to augmentation.

Saphenofemoral Junction: No evidence of thrombus. Normal
compressibility and flow on color Doppler imaging.

Profunda Femoral Vein: No evidence of thrombus. Normal
compressibility and flow on color Doppler imaging.

Femoral Vein: No evidence of thrombus. Normal compressibility,
respiratory phasicity and response to augmentation.

Popliteal Vein: No evidence of thrombus. Normal compressibility,
respiratory phasicity and response to augmentation.

Calf Veins: No evidence of thrombus. Normal compressibility and flow
on color Doppler imaging.

Superficial Great Saphenous Vein: No evidence of thrombus. Normal
compressibility and flow on color Doppler imaging.

Venous Reflux:  None.

Other Findings:  None.
IMPRESSION: No evidence of deep venous thrombosis.

## 2016-01-25 DIAGNOSIS — R000 Tachycardia, unspecified: Secondary | ICD-10-CM | POA: Diagnosis not present

## 2016-02-08 DIAGNOSIS — L821 Other seborrheic keratosis: Secondary | ICD-10-CM | POA: Diagnosis not present

## 2016-02-08 DIAGNOSIS — Z85828 Personal history of other malignant neoplasm of skin: Secondary | ICD-10-CM | POA: Diagnosis not present

## 2016-02-08 DIAGNOSIS — Z08 Encounter for follow-up examination after completed treatment for malignant neoplasm: Secondary | ICD-10-CM | POA: Diagnosis not present

## 2016-03-01 DIAGNOSIS — N390 Urinary tract infection, site not specified: Secondary | ICD-10-CM | POA: Diagnosis not present

## 2016-03-01 DIAGNOSIS — R350 Frequency of micturition: Secondary | ICD-10-CM | POA: Diagnosis not present

## 2016-03-01 DIAGNOSIS — R5383 Other fatigue: Secondary | ICD-10-CM | POA: Diagnosis not present

## 2016-05-23 DIAGNOSIS — Z13220 Encounter for screening for lipoid disorders: Secondary | ICD-10-CM | POA: Diagnosis not present

## 2016-05-23 DIAGNOSIS — N3020 Other chronic cystitis without hematuria: Secondary | ICD-10-CM | POA: Diagnosis not present

## 2016-05-23 DIAGNOSIS — Z131 Encounter for screening for diabetes mellitus: Secondary | ICD-10-CM | POA: Diagnosis not present

## 2016-06-06 DIAGNOSIS — Z0000 Encounter for general adult medical examination without abnormal findings: Secondary | ICD-10-CM | POA: Diagnosis not present

## 2016-06-06 DIAGNOSIS — Z23 Encounter for immunization: Secondary | ICD-10-CM | POA: Diagnosis not present

## 2016-09-28 DIAGNOSIS — Z961 Presence of intraocular lens: Secondary | ICD-10-CM | POA: Diagnosis not present

## 2016-12-05 DIAGNOSIS — D649 Anemia, unspecified: Secondary | ICD-10-CM | POA: Diagnosis not present

## 2016-12-05 DIAGNOSIS — R350 Frequency of micturition: Secondary | ICD-10-CM | POA: Diagnosis not present

## 2016-12-05 DIAGNOSIS — E7800 Pure hypercholesterolemia, unspecified: Secondary | ICD-10-CM | POA: Diagnosis not present

## 2016-12-05 DIAGNOSIS — R8290 Unspecified abnormal findings in urine: Secondary | ICD-10-CM | POA: Diagnosis not present

## 2016-12-05 DIAGNOSIS — Z131 Encounter for screening for diabetes mellitus: Secondary | ICD-10-CM | POA: Diagnosis not present

## 2016-12-12 DIAGNOSIS — I10 Essential (primary) hypertension: Secondary | ICD-10-CM | POA: Diagnosis not present

## 2016-12-12 DIAGNOSIS — Z131 Encounter for screening for diabetes mellitus: Secondary | ICD-10-CM | POA: Diagnosis not present

## 2016-12-12 DIAGNOSIS — J4540 Moderate persistent asthma, uncomplicated: Secondary | ICD-10-CM | POA: Diagnosis not present

## 2016-12-12 DIAGNOSIS — D649 Anemia, unspecified: Secondary | ICD-10-CM | POA: Diagnosis not present

## 2017-01-24 ENCOUNTER — Emergency Department
Admission: EM | Admit: 2017-01-24 | Discharge: 2017-01-24 | Disposition: A | Discharge status: Home / Self Care | Payer: PPO | Attending: Emergency Medicine | Admitting: Emergency Medicine

## 2017-01-24 ENCOUNTER — Encounter: Payer: Self-pay | Admitting: *Deleted

## 2017-01-24 DIAGNOSIS — R221 Localized swelling, mass and lump, neck: Secondary | ICD-10-CM | POA: Diagnosis not present

## 2017-01-24 DIAGNOSIS — J45909 Unspecified asthma, uncomplicated: Secondary | ICD-10-CM | POA: Diagnosis not present

## 2017-01-24 DIAGNOSIS — T783XXA Angioneurotic edema, initial encounter: Secondary | ICD-10-CM | POA: Diagnosis not present

## 2017-01-24 HISTORY — DX: Unspecified asthma, uncomplicated: J45.909

## 2017-01-24 MED ORDER — DIPHENHYDRAMINE HCL 25 MG PO CAPS: 25.0000 mg | ORAL_CAPSULE | ORAL | 2 refills | Status: DC | PRN

## 2017-01-24 MED ORDER — METHYLPREDNISOLONE 4 MG PO TBPK: ORAL_TABLET | ORAL | 0 refills | Status: DC

## 2017-01-24 MED ORDER — SODIUM CHLORIDE 0.9 % IV SOLN
Freq: Once | INTRAVENOUS | Status: AC
  Administered 2017-01-24: 11:00:00 via INTRAVENOUS

## 2017-01-24 MED ORDER — ONDANSETRON HCL 4 MG/2ML IJ SOLN
4.0000 mg | Freq: Once | INTRAMUSCULAR | Status: AC
  Administered 2017-01-24: 4 mg via INTRAVENOUS
  Filled 2017-01-24: qty 2

## 2017-01-24 MED ORDER — FAMOTIDINE IN NACL 20-0.9 MG/50ML-% IV SOLN
20.0000 mg | Freq: Once | INTRAVENOUS | Status: AC
  Administered 2017-01-24: 20 mg via INTRAVENOUS
  Filled 2017-01-24: qty 50

## 2017-01-24 MED ORDER — DIPHENHYDRAMINE HCL 50 MG/ML IJ SOLN
25.0000 mg | Freq: Once | INTRAMUSCULAR | Status: AC
  Administered 2017-01-24: 25 mg via INTRAVENOUS
  Filled 2017-01-24: qty 1

## 2017-01-24 MED ORDER — ONDANSETRON 4 MG PO TBDP: 4.0000 mg | ORAL_TABLET | Freq: Three times a day (TID) | ORAL | 0 refills | Status: DC | PRN

## 2017-01-24 MED ORDER — METHYLPREDNISOLONE SODIUM SUCC 125 MG IJ SOLR
125.0000 mg | Freq: Once | INTRAMUSCULAR | Status: AC
  Administered 2017-01-24: 125 mg via INTRAVENOUS
  Filled 2017-01-24: qty 2

## 2017-01-24 NOTE — ED Provider Notes (Signed)
[facility] Emergency Department Provider Note       Time seen: ----------------------------------------- 11:09 AM on 01/24/2017 -----------------------------------------     I have reviewed the triage vital signs and the nursing notes.   HISTORY   Chief Complaint Allergic Reaction    HPI Joan Ingram is a 83 y.o. female who presents to the ED for neck swelling. Patient states at 3 AM she noticed some swelling of her tongue which has subsequently resolved but then she noted swelling of her neck anteriorly. She has not had swelling like that in her neck for. She does have history of tongue and lip swelling and has multiple allergies in the past she states she has been diagnosed with angioedema in the past. She has not taken anything prior to arrival, states swelling has decreased. She can swallow and breathe normally. She denies recent illness or other complaints.   Past Medical History:  Diagnosis Date  . Asthma     There are no active problems to display for this patient.   History reviewed. No pertinent surgical history.  Allergies Aspirin; Benadryl [diphenhydramine hcl]; Ciprofloxacin; Codeine; Doxycycline; Levaquin [levofloxacin]; Macrobid [nitrofurantoin macrocrystal]; Morphine and related; and Penicillins  Social History Social History  Substance Use Topics  . Smoking status: Never Smoker  . Smokeless tobacco: Never Used  . Alcohol use No    Review of Systems Constitutional: Negative for fever. Eyes: Negative for vision changes ENT:  Negative for congestion, sore throat. Positive for neck swelling Cardiovascular: Negative for chest pain. Respiratory: Negative for shortness of breath. Gastrointestinal: Negative for abdominal pain, vomiting and diarrhea. Genitourinary: Negative for dysuria. Musculoskeletal: Negative for back pain. Skin: Positive for edema and erythema in the neck anteriorly Neurological: Negative for headaches,  focal weakness or numbness.  All systems negative/normal/unremarkable except as stated in the HPI  ____________________________________________   PHYSICAL EXAM:  VITAL SIGNS: ED Triage Vitals [01/24/17 1028]  Enc Vitals Group     BP (!) 161/72     Pulse Rate 90     Resp 15     Temp 98.2 F (36.8 C)     Temp Source Oral     SpO2 99 %     Weight 160 lb (72.6 kg)     Height 5\' 3"  (1.6 m)     Head Circumference      Peak Flow      Pain Score      Pain Loc      Pain Edu?      Excl. in [place]?     Constitutional: Alert and oriented. Well appearing and in no distress. Eyes: Conjunctivae are normal. PERRL. Normal extraocular movements. ENT   Head: Normocephalic and atraumatic.   Nose: No congestion/rhinnorhea.   Mouth/Throat: Mucous membranes are moist.   Neck: No stridor. Swelling from the submental area to above the thyroid cartilage. Boggy edema is noted Cardiovascular: Normal rate, regular rhythm. No murmurs, rubs, or gallops. Respiratory: Normal respiratory effort without tachypnea nor retractions. Breath sounds are clear and equal bilaterally. No wheezes/rales/rhonchi. Gastrointestinal: Soft and nontender. Normal bowel sounds Musculoskeletal: Nontender with normal range of motion in extremities. No lower extremity tenderness nor edema. Neurologic:  Normal speech and language. No gross focal neurologic deficits are appreciated.  Skin: Edema, erythema and swelling is noted to the neck anteriorly. Psychiatric: Mood and affect are normal. Speech and behavior are normal.  ____________________________________________  ED COURSE:  Pertinent labs & imaging results that were available during my care of the  patient were reviewed by me and considered in my medical decision making (see chart for details). Patient presents for allergic reaction versus angioedema, patient receive IV steroids and antihistamines and we will observe. Clinical Course as of Jan 25 1348  Wed Jan 24, 2017  1225 Patient appears somewhat improved at this time. No significant worsening  [JW]    Clinical Course User Index [JW] Earleen Newport, MD   Procedures  ____________________________________________  FINAL ASSESSMENT AND PLAN  Angioedema  Plan: Patient's labs and imaging were dictated above. Patient had presented for Anterior neck swelling that it has improved in the ER with treatment. She'll be discharged with Benadryl and steroids and advised to have close outpatient follow-up.   Earleen Newport, MD   Note: This note was generated in part or whole with voice recognition software. Voice recognition is usually quite accurate but there are transcription errors that can and very often do occur. I apologize for any typographical errors that were not detected and corrected.     Earleen Newport, MD 01/24/17 [phone]

## 2017-01-24 NOTE — ED Triage Notes (Signed)
States at 3 am she noticed some swelling to her tongue, pts tongue appears beefy, swelling noted to neck area, pt denies any new medicine or new foods, pt did not take anything before arrival, states swelling as decreased , speaking in full clear sentences, resp even and unlabored, maintaining secretions

## 2017-02-14 DIAGNOSIS — L821 Other seborrheic keratosis: Secondary | ICD-10-CM | POA: Diagnosis not present

## 2017-02-14 DIAGNOSIS — Z85828 Personal history of other malignant neoplasm of skin: Secondary | ICD-10-CM | POA: Diagnosis not present

## 2017-02-14 DIAGNOSIS — Z08 Encounter for follow-up examination after completed treatment for malignant neoplasm: Secondary | ICD-10-CM | POA: Diagnosis not present

## 2017-06-05 DIAGNOSIS — Z131 Encounter for screening for diabetes mellitus: Secondary | ICD-10-CM | POA: Diagnosis not present

## 2017-06-05 DIAGNOSIS — D649 Anemia, unspecified: Secondary | ICD-10-CM | POA: Diagnosis not present

## 2017-06-12 DIAGNOSIS — R350 Frequency of micturition: Secondary | ICD-10-CM | POA: Diagnosis not present

## 2017-06-12 DIAGNOSIS — D649 Anemia, unspecified: Secondary | ICD-10-CM | POA: Diagnosis not present

## 2017-06-12 DIAGNOSIS — Z0000 Encounter for general adult medical examination without abnormal findings: Secondary | ICD-10-CM | POA: Diagnosis not present

## 2017-06-12 DIAGNOSIS — Z23 Encounter for immunization: Secondary | ICD-10-CM | POA: Diagnosis not present

## 2017-10-04 DIAGNOSIS — H1859 Other hereditary corneal dystrophies: Secondary | ICD-10-CM | POA: Diagnosis not present

## 2017-12-04 DIAGNOSIS — D649 Anemia, unspecified: Secondary | ICD-10-CM | POA: Diagnosis not present

## 2017-12-04 DIAGNOSIS — R350 Frequency of micturition: Secondary | ICD-10-CM | POA: Diagnosis not present

## 2017-12-11 DIAGNOSIS — I10 Essential (primary) hypertension: Secondary | ICD-10-CM | POA: Diagnosis not present

## 2017-12-11 DIAGNOSIS — J4520 Mild intermittent asthma, uncomplicated: Secondary | ICD-10-CM | POA: Diagnosis not present

## 2017-12-11 DIAGNOSIS — D649 Anemia, unspecified: Secondary | ICD-10-CM | POA: Diagnosis not present

## 2017-12-11 DIAGNOSIS — N3946 Mixed incontinence: Secondary | ICD-10-CM | POA: Diagnosis not present

## 2017-12-11 DIAGNOSIS — R350 Frequency of micturition: Secondary | ICD-10-CM | POA: Diagnosis not present

## 2018-02-13 DIAGNOSIS — L821 Other seborrheic keratosis: Secondary | ICD-10-CM | POA: Diagnosis not present

## 2018-02-13 DIAGNOSIS — Z85828 Personal history of other malignant neoplasm of skin: Secondary | ICD-10-CM | POA: Diagnosis not present

## 2018-02-13 DIAGNOSIS — Z08 Encounter for follow-up examination after completed treatment for malignant neoplasm: Secondary | ICD-10-CM | POA: Diagnosis not present

## 2018-05-10 DIAGNOSIS — J3089 Other allergic rhinitis: Secondary | ICD-10-CM | POA: Diagnosis not present

## 2018-05-10 DIAGNOSIS — J4520 Mild intermittent asthma, uncomplicated: Secondary | ICD-10-CM | POA: Diagnosis not present

## 2018-05-10 DIAGNOSIS — T783XXA Angioneurotic edema, initial encounter: Secondary | ICD-10-CM | POA: Diagnosis not present

## 2018-06-25 DIAGNOSIS — D649 Anemia, unspecified: Secondary | ICD-10-CM | POA: Diagnosis not present

## 2018-06-25 DIAGNOSIS — N3946 Mixed incontinence: Secondary | ICD-10-CM | POA: Diagnosis not present

## 2018-06-25 DIAGNOSIS — R350 Frequency of micturition: Secondary | ICD-10-CM | POA: Diagnosis not present

## 2018-07-02 DIAGNOSIS — Z0000 Encounter for general adult medical examination without abnormal findings: Secondary | ICD-10-CM | POA: Diagnosis not present

## 2018-07-02 DIAGNOSIS — Z23 Encounter for immunization: Secondary | ICD-10-CM | POA: Diagnosis not present

## 2018-10-17 DIAGNOSIS — H1859 Other hereditary corneal dystrophies: Secondary | ICD-10-CM | POA: Diagnosis not present

## 2019-01-28 DIAGNOSIS — J3089 Other allergic rhinitis: Secondary | ICD-10-CM | POA: Diagnosis not present

## 2019-01-28 DIAGNOSIS — T783XXD Angioneurotic edema, subsequent encounter: Secondary | ICD-10-CM | POA: Diagnosis not present

## 2019-01-28 DIAGNOSIS — J4520 Mild intermittent asthma, uncomplicated: Secondary | ICD-10-CM | POA: Diagnosis not present

## 2019-02-25 DIAGNOSIS — J4520 Mild intermittent asthma, uncomplicated: Secondary | ICD-10-CM | POA: Diagnosis not present

## 2019-02-25 DIAGNOSIS — T783XXD Angioneurotic edema, subsequent encounter: Secondary | ICD-10-CM | POA: Diagnosis not present

## 2019-02-25 DIAGNOSIS — J3089 Other allergic rhinitis: Secondary | ICD-10-CM | POA: Diagnosis not present

## 2019-02-27 DIAGNOSIS — T783XXA Angioneurotic edema, initial encounter: Secondary | ICD-10-CM | POA: Diagnosis not present

## 2019-06-25 DIAGNOSIS — Z961 Presence of intraocular lens: Secondary | ICD-10-CM | POA: Diagnosis not present

## 2019-07-08 DIAGNOSIS — H26491 Other secondary cataract, right eye: Secondary | ICD-10-CM | POA: Diagnosis not present

## 2019-07-23 DIAGNOSIS — R7981 Abnormal blood-gas level: Secondary | ICD-10-CM | POA: Diagnosis not present

## 2019-07-23 DIAGNOSIS — D649 Anemia, unspecified: Secondary | ICD-10-CM | POA: Diagnosis not present

## 2019-07-29 DIAGNOSIS — Z0000 Encounter for general adult medical examination without abnormal findings: Secondary | ICD-10-CM | POA: Diagnosis not present

## 2019-11-25 DIAGNOSIS — Z961 Presence of intraocular lens: Secondary | ICD-10-CM | POA: Diagnosis not present

## 2020-01-21 DIAGNOSIS — D649 Anemia, unspecified: Secondary | ICD-10-CM | POA: Diagnosis not present

## 2020-01-21 DIAGNOSIS — E871 Hypo-osmolality and hyponatremia: Secondary | ICD-10-CM | POA: Diagnosis not present

## 2020-01-26 DIAGNOSIS — E871 Hypo-osmolality and hyponatremia: Secondary | ICD-10-CM | POA: Diagnosis not present

## 2020-01-26 DIAGNOSIS — J4540 Moderate persistent asthma, uncomplicated: Secondary | ICD-10-CM | POA: Diagnosis not present

## 2020-01-26 DIAGNOSIS — D649 Anemia, unspecified: Secondary | ICD-10-CM | POA: Diagnosis not present

## 2020-01-26 DIAGNOSIS — R2681 Unsteadiness on feet: Secondary | ICD-10-CM | POA: Diagnosis not present

## 2020-01-27 DIAGNOSIS — T783XXD Angioneurotic edema, subsequent encounter: Secondary | ICD-10-CM | POA: Diagnosis not present

## 2020-01-27 DIAGNOSIS — J3089 Other allergic rhinitis: Secondary | ICD-10-CM | POA: Diagnosis not present

## 2020-01-27 DIAGNOSIS — J4520 Mild intermittent asthma, uncomplicated: Secondary | ICD-10-CM | POA: Diagnosis not present

## 2020-02-20 DIAGNOSIS — I509 Heart failure, unspecified: Secondary | ICD-10-CM | POA: Diagnosis not present

## 2020-02-24 DIAGNOSIS — I499 Cardiac arrhythmia, unspecified: Secondary | ICD-10-CM | POA: Diagnosis not present

## 2020-02-24 DIAGNOSIS — I509 Heart failure, unspecified: Secondary | ICD-10-CM | POA: Diagnosis not present

## 2020-02-24 DIAGNOSIS — Z79899 Other long term (current) drug therapy: Secondary | ICD-10-CM | POA: Diagnosis not present

## 2020-02-24 DIAGNOSIS — I4891 Unspecified atrial fibrillation: Secondary | ICD-10-CM | POA: Diagnosis not present

## 2020-02-26 DIAGNOSIS — M722 Plantar fascial fibromatosis: Secondary | ICD-10-CM | POA: Insufficient documentation

## 2020-02-26 DIAGNOSIS — R600 Localized edema: Secondary | ICD-10-CM | POA: Diagnosis not present

## 2020-02-26 DIAGNOSIS — R0602 Shortness of breath: Secondary | ICD-10-CM | POA: Diagnosis not present

## 2020-02-26 DIAGNOSIS — I10 Essential (primary) hypertension: Secondary | ICD-10-CM | POA: Diagnosis not present

## 2020-02-26 DIAGNOSIS — I4891 Unspecified atrial fibrillation: Secondary | ICD-10-CM | POA: Diagnosis not present

## 2020-02-26 DIAGNOSIS — I480 Paroxysmal atrial fibrillation: Secondary | ICD-10-CM | POA: Insufficient documentation

## 2020-02-26 DIAGNOSIS — S82843A Displaced bimalleolar fracture of unspecified lower leg, initial encounter for closed fracture: Secondary | ICD-10-CM | POA: Insufficient documentation

## 2020-03-11 DIAGNOSIS — I4891 Unspecified atrial fibrillation: Secondary | ICD-10-CM | POA: Diagnosis not present

## 2020-03-29 DIAGNOSIS — I4891 Unspecified atrial fibrillation: Secondary | ICD-10-CM | POA: Diagnosis not present

## 2020-03-29 DIAGNOSIS — R0602 Shortness of breath: Secondary | ICD-10-CM | POA: Diagnosis not present

## 2020-03-29 DIAGNOSIS — I10 Essential (primary) hypertension: Secondary | ICD-10-CM | POA: Diagnosis not present

## 2020-04-01 DIAGNOSIS — I10 Essential (primary) hypertension: Secondary | ICD-10-CM | POA: Diagnosis not present

## 2020-04-01 DIAGNOSIS — I4891 Unspecified atrial fibrillation: Secondary | ICD-10-CM | POA: Diagnosis not present

## 2020-04-01 DIAGNOSIS — R600 Localized edema: Secondary | ICD-10-CM | POA: Diagnosis not present

## 2020-04-15 DIAGNOSIS — I4891 Unspecified atrial fibrillation: Secondary | ICD-10-CM | POA: Diagnosis not present

## 2020-04-19 ENCOUNTER — Other Ambulatory Visit
Admission: RE | Admit: 2020-04-19 | Discharge: 2020-04-19 | Disposition: A | Discharge status: Home / Self Care | Payer: PPO | Source: Ambulatory Visit | Attending: Cardiology | Admitting: Cardiology

## 2020-04-19 ENCOUNTER — Other Ambulatory Visit: Payer: Self-pay

## 2020-04-19 DIAGNOSIS — Z20822 Contact with and (suspected) exposure to covid-19: Secondary | ICD-10-CM | POA: Diagnosis not present

## 2020-04-19 DIAGNOSIS — Z01812 Encounter for preprocedural laboratory examination: Secondary | ICD-10-CM | POA: Diagnosis not present

## 2020-04-20 ENCOUNTER — Other Ambulatory Visit: Payer: Self-pay | Admitting: Cardiology

## 2020-04-20 LAB — SARS CORONAVIRUS 2 (TAT 6-24 HRS): SARS Coronavirus 2: NEGATIVE

## 2020-04-21 ENCOUNTER — Encounter: Payer: Self-pay | Admitting: Cardiology

## 2020-04-21 ENCOUNTER — Other Ambulatory Visit: Payer: Self-pay

## 2020-04-21 ENCOUNTER — Ambulatory Visit: Payer: PPO | Admitting: Registered Nurse

## 2020-04-21 ENCOUNTER — Encounter
Admission: RE | Disposition: A | Discharge status: Home / Self Care | Payer: Self-pay | Source: Ambulatory Visit | Attending: Cardiology

## 2020-04-21 ENCOUNTER — Ambulatory Visit
Admission: RE | Admit: 2020-04-21 | Discharge: 2020-04-21 | Disposition: A | Discharge status: Home / Self Care | Payer: PPO | Source: Ambulatory Visit | Attending: Cardiology | Admitting: Cardiology

## 2020-04-21 DIAGNOSIS — Z7951 Long term (current) use of inhaled steroids: Secondary | ICD-10-CM | POA: Diagnosis not present

## 2020-04-21 DIAGNOSIS — J449 Chronic obstructive pulmonary disease, unspecified: Secondary | ICD-10-CM | POA: Insufficient documentation

## 2020-04-21 DIAGNOSIS — I4891 Unspecified atrial fibrillation: Secondary | ICD-10-CM | POA: Diagnosis not present

## 2020-04-21 DIAGNOSIS — I480 Paroxysmal atrial fibrillation: Secondary | ICD-10-CM | POA: Diagnosis not present

## 2020-04-21 DIAGNOSIS — M810 Age-related osteoporosis without current pathological fracture: Secondary | ICD-10-CM | POA: Diagnosis not present

## 2020-04-21 DIAGNOSIS — Z7901 Long term (current) use of anticoagulants: Secondary | ICD-10-CM | POA: Insufficient documentation

## 2020-04-21 DIAGNOSIS — K219 Gastro-esophageal reflux disease without esophagitis: Secondary | ICD-10-CM | POA: Diagnosis not present

## 2020-04-21 DIAGNOSIS — R600 Localized edema: Secondary | ICD-10-CM | POA: Insufficient documentation

## 2020-04-21 DIAGNOSIS — I10 Essential (primary) hypertension: Secondary | ICD-10-CM | POA: Insufficient documentation

## 2020-04-21 DIAGNOSIS — Z79899 Other long term (current) drug therapy: Secondary | ICD-10-CM | POA: Diagnosis not present

## 2020-04-21 HISTORY — PX: CARDIOVERSION: SHX1299

## 2020-04-21 SURGERY — CARDIOVERSION
Anesthesia: General

## 2020-04-21 MED ORDER — SODIUM CHLORIDE 0.9 % IV SOLN: INTRAVENOUS | Status: DC

## 2020-04-21 MED ORDER — PROPOFOL 10 MG/ML IV BOLUS
INTRAVENOUS | Status: DC | PRN
  Administered 2020-04-21: 40 mg via INTRAVENOUS
  Administered 2020-04-21: 10 mg via INTRAVENOUS

## 2020-04-21 MED ORDER — PHENYLEPHRINE HCL (PRESSORS) 10 MG/ML IV SOLN
INTRAVENOUS | Status: AC
  Filled 2020-04-21: qty 1

## 2020-04-21 MED ORDER — PROPOFOL 10 MG/ML IV BOLUS
INTRAVENOUS | Status: AC
Start: 2020-04-21 — End: ?
  Filled 2020-04-21: qty 20

## 2020-04-21 NOTE — Anesthesia Preprocedure Evaluation (Signed)
Anesthesia Evaluation  Patient identified by MRN, date of birth, ID band Patient awake    Reviewed: Allergy & Precautions, H&P , NPO status , Patient's Chart, lab work & pertinent test results, reviewed documented beta blocker date and time   History of Anesthesia Complications Negative for: history of anesthetic complications  Airway Mallampati: II  TM Distance: >3 FB Neck ROM: full    Dental  (+) Dental Advidsory Given   Pulmonary shortness of breath (from the a fib), asthma , neg sleep apnea, neg COPD, neg recent URI,    Pulmonary exam normal breath sounds clear to auscultation       Cardiovascular Exercise Tolerance: Good (-) hypertension(-) angina(-) Past MI and (-) Cardiac Stents + dysrhythmias Atrial Fibrillation (-) Valvular Problems/Murmurs Rhythm:irregular Rate:Abnormal     Neuro/Psych negative neurological ROS  negative psych ROS   GI/Hepatic negative GI ROS, Neg liver ROS,   Endo/Other  negative endocrine ROS  Renal/GU negative Renal ROS  negative genitourinary   Musculoskeletal   Abdominal   Peds  Hematology negative hematology ROS (+)   Anesthesia Other Findings Past Medical History: No date: Asthma   Reproductive/Obstetrics negative OB ROS                             Anesthesia Physical Anesthesia Plan  ASA: II  Anesthesia Plan: General   Post-op Pain Management:    Induction: Intravenous  PONV Risk Score and Plan: 3 and Propofol infusion and TIVA  Airway Management Planned: Natural Airway and Nasal Cannula  Additional Equipment:   Intra-op Plan:   Post-operative Plan:   Informed Consent: I have reviewed the patients History and Physical, chart, labs and discussed the procedure including the risks, benefits and alternatives for the proposed anesthesia with the patient or authorized representative who has indicated his/her understanding and acceptance.      Dental Advisory Given  Plan Discussed with: Anesthesiologist, CRNA and Surgeon  Anesthesia Plan Comments:         Anesthesia Quick Evaluation

## 2020-04-21 NOTE — Discharge Instructions (Signed)
General Anesthesia, Adult, Care After This sheet gives you information about how to care for yourself after your procedure. Your health care provider may also give you more specific instructions. If you have problems or questions, contact your health care provider. What can I expect after the procedure? After the procedure, the following side effects are common: Pain or discomfort at the IV site. Nausea. Vomiting. Sore throat. Trouble concentrating. Feeling cold or chills. Weak or tired. Sleepiness and fatigue. Soreness and body aches. These side effects can affect parts of the body that were not involved in surgery. Follow these instructions at home:  For at least 24 hours after the procedure: Have a responsible adult stay with you. It is important to have someone help care for you until you are awake and alert. Rest as needed. Do not: Participate in activities in which you could fall or become injured. Drive. Use heavy machinery. Drink alcohol. Take sleeping pills or medicines that cause drowsiness. Make important decisions or sign legal documents. Take care of children on your own. Eating and drinking Follow any instructions from your health care provider about eating or drinking restrictions. When you feel hungry, start by eating small amounts of foods that are soft and easy to digest (bland), such as toast. Gradually return to your regular diet. Drink enough fluid to keep your urine pale yellow. If you vomit, rehydrate by drinking water, juice, or clear broth. General instructions If you have sleep apnea, surgery and certain medicines can increase your risk for breathing problems. Follow instructions from your health care provider about wearing your sleep device: Anytime you are sleeping, including during daytime naps. While taking prescription pain medicines, sleeping medicines, or medicines that make you drowsy. Return to your normal activities as told by your health care  provider. Ask your health care provider what activities are safe for you. Take over-the-counter and prescription medicines only as told by your health care provider. If you smoke, do not smoke without supervision. Keep all follow-up visits as told by your health care provider. This is important. Contact a health care provider if: You have nausea or vomiting that does not get better with medicine. You cannot eat or drink without vomiting. You have pain that does not get better with medicine. You are unable to pass urine. You develop a skin rash. You have a fever. You have redness around your IV site that gets worse. Get help right away if: You have difficulty breathing. You have chest pain. You have blood in your urine or stool, or you vomit blood. Summary After the procedure, it is common to have a sore throat or nausea. It is also common to feel tired. Have a responsible adult stay with you for the first 24 hours after general anesthesia. It is important to have someone help care for you until you are awake and alert. When you feel hungry, start by eating small amounts of foods that are soft and easy to digest (bland), such as toast. Gradually return to your regular diet. Drink enough fluid to keep your urine pale yellow. Return to your normal activities as told by your health care provider. Ask your health care provider what activities are safe for you. This information is not intended to replace advice given to you by your health care provider. Make sure you discuss any questions you have with your health care provider. Document Revised: 09/07/2017 Document Reviewed: 04/20/2017 Elsevier Patient Education  Section.  Hospital doctor cardioversion is the  delivery of a jolt of electricity to restore a normal rhythm to the heart. A rhythm that is too fast or is not regular keeps the heart from pumping well. In this procedure, sticky patches or metal paddles are  placed on the chest to deliver electricity to the heart from a device. This procedure may be done in an emergency if:  There is low or no blood pressure as a result of the heart rhythm.  Normal rhythm must be restored as fast as possible to protect the brain and heart from further damage.  It may save a life. This may also be a scheduled procedure for irregular or fast heart rhythms that are not immediately life-threatening. Tell a health care provider about:  Any allergies you have.  All medicines you are taking, including vitamins, herbs, eye drops, creams, and over-the-counter medicines.  Any problems you or family members have had with anesthetic medicines.  Any blood disorders you have.  Any surgeries you have had.  Any medical conditions you have.  Whether you are pregnant or may be pregnant. What are the risks? Generally, this is a safe procedure. However, problems may occur, including:  Allergic reactions to medicines.  A blood clot that breaks free and travels to other parts of your body.  The possible return of an abnormal heart rhythm within hours or days after the procedure.  Your heart stopping (cardiac arrest). This is rare. What happens before the procedure? Medicines  Your health care provider may have you start taking: ? Blood-thinning medicines (anticoagulants) so your blood does not clot as easily. ? Medicines to help stabilize your heart rate and rhythm.  Ask your health care provider about: ? Changing or stopping your regular medicines. This is especially important if you are taking diabetes medicines or blood thinners. ? Taking medicines such as aspirin and ibuprofen. These medicines can thin your blood. Do not take these medicines unless your health care provider tells you to take them. ? Taking over-the-counter medicines, vitamins, herbs, and supplements. General instructions  Follow instructions from your health care provider about eating or  drinking restrictions.  Plan to have someone take you home from the hospital or clinic.  If you will be going home right after the procedure, plan to have someone with you for 24 hours.  Ask your health care provider what steps will be taken to help prevent infection. These may include washing your skin with a germ-killing soap. What happens during the procedure?   An IV will be inserted into one of your veins.  Sticky patches (electrodes) or metal paddles may be placed on your chest.  You will be given a medicine to help you relax (sedative).  An electrical shock will be delivered. The procedure may vary among health care providers and hospitals. What can I expect after the procedure?  Your blood pressure, heart rate, breathing rate, and blood oxygen level will be monitored until you leave the hospital or clinic.  Your heart rhythm will be watched to make sure it does not change.  You may have some redness on the skin where the shocks were given. Follow these instructions at home:  Do not drive for 24 hours if you were given a sedative during your procedure.  Take over-the-counter and prescription medicines only as told by your health care provider.  Ask your health care provider how to check your pulse. Check it often.  Rest for 48 hours after the procedure or as told by your health  care provider.  Avoid or limit your caffeine use as told by your health care provider.  Keep all follow-up visits as told by your health care provider. This is important. Contact a health care provider if:  You feel like your heart is beating too quickly or your pulse is not regular.  You have a serious muscle cramp that does not go away. Get help right away if:  You have discomfort in your chest.  You are dizzy or you feel faint.  You have trouble breathing or you are short of breath.  Your speech is slurred.  You have trouble moving an arm or leg on one side of your body.  Your  fingers or toes turn cold or blue. Summary  Electrical cardioversion is the delivery of a jolt of electricity to restore a normal rhythm to the heart.  This procedure may be done right away in an emergency or may be a scheduled procedure if the condition is not an emergency.  Generally, this is a safe procedure.  After the procedure, check your pulse often as told by your health care provider. This information is not intended to replace advice given to you by your health care provider. Make sure you discuss any questions you have with your health care provider. Document Revised: 04/07/2019 Document Reviewed: 04/07/2019 Elsevier Patient Education  [place].

## 2020-04-21 NOTE — Procedures (Signed)
After informed consent, time out protocol and adequate sedation, pt received a Biphasic, synchronized 120J shock. Pt converted to nsr. N0 immediate complications.

## 2020-04-21 NOTE — H&P (Signed)
Chief Complaint: Chief Complaint  Patient presents with  . Atrial Fibrillation  . Shortness of Breath  Date of Service: 04/01/2020 Date of Birth: [birth year] PCP: Barnabas Lister, MD  History of Present Illness: Joan Ingram is a 86 y.o.female patient with a past medical history significant for hypertension and new onset atrial fibrillation who presents to review Holter monitor and echocardiogram results. She was recently evaluated by her PCP due to shortness of breath, weight gain, and lower leg swelling. She was started on Lasix 40mg  daily with mild improvement in symptoms. She was also noted to be in new onset atrial fibrillation and was started on Eliquis. Since beginning Eliquis, she admits to intermittent episodes of dizziness as well as diarrhea. She has been compliant with Lasix 40mg  daily but continues to experience bilateral lower extremity swelling as well as mild exertional dyspnea. She denies chest pain, chest pressure, palpitations, or heart racing sensation. She denies orthopnea or PND. She denies syncopal/presyncopal episodes.   We discussed the results of the echocardiogram and Holter monitor at today's visit. Echocardiogram revealed normal RV and LV systolic function with an EF estimated greater than 55% with moderate TR and mild MR - no significant change from echo in 2010. Holter monitor revealed sustained atrial fibrillation with occurrence of RVR; average rate was 86bpm.   Past Medical and Surgical History  Past Medical History Past Medical History:  Diagnosis Date  . Allergic rhinitis  . Anemia, unspecified  . Asthma  . Cervicalgia  . Chronic airway obstruction, not elsewhere classified , unspecified (CMS-HCC)  . Dermatophytosis of nail  . Esophageal reflux  . Essential hypertension, benign  . Exostosis of unspecified site  . Hallux valgus (acquired)  . Headache(784.0)  . History of colon polyps  Hyperplastic, colonoscopy August 2000  . Impaired glucose  tolerance  . Iron deficiency anemia, unspecified  . Osteoporosis  . Senile osteoporosis   Past Surgical History She has a past surgical history that includes Cholecystectomy; Colonoscopy 2010; and Hysterectomy (1965).   Medications and Allergies  Current Medications  Current Outpatient Medications  Medication Sig Dispense Refill  . albuterol 90 mcg/actuation inhaler Inhale 2 inhalations into the lungs every 6 (six) hours as needed for Wheezing  . diltiazem (CARDIZEM CD) 120 MG XR capsule Take 1 capsule (120 mg total) by mouth once daily 30 capsule 11  . fluticasone propionate (FLOVENT HFA) 110 mcg/actuation inhaler Inhale 2 inhalations into the lungs 2 (two) times daily  . FUROsemide (LASIX) 40 MG tablet Take 1 tablet (40 mg total) by mouth once daily 30 tablet 0  . potassium chloride (K-TAB) 20 mEq TbER ER tablet Take 1 tablet (20 mEq total) by mouth 2 (two) times daily 60 tablet 0  . rivaroxaban (XARELTO) 20 mg tablet Take 1 tablet (20 mg total) by mouth once daily 30 tablet 11   No current facility-administered medications for this visit.   Allergies: Sulfa (sulfonamide antibiotics), Aciphex [rabeprazole], Allegra [fexofenadine], Aspirin, Ciprofloxacin, Claritin [loratadine], Hismanal [astemizole], Levaquin [levofloxacin], Macrobid [nitrofurantoin monohyd/m-cryst], Omeprazole, Penicillin g potassium, Prevacid [lansoprazole], Protonix [pantoprazole], and Seldane [terfenadine]  Social and Family History  Social History reports that she has never smoked. She has never used smokeless tobacco. She reports current alcohol use.  Family History Family History  Problem Relation Age of Onset  . Atrial fibrillation (Abnormal heart rhythm sometimes requiring treatment with blood thinners) Mother  . Colon polyps Mother  . Atrial fibrillation (Abnormal heart rhythm sometimes requiring treatment with blood thinners) Father  . Colon  cancer Maternal Aunt  . Colon cancer Other   Review of  Systems   Review of Systems: The patient denies chest pain, shortness of breath, orthopnea, paroxysmal nocturnal dyspnea, pedal edema, palpitations, heart racing, fatigue, dizziness, lightheadedness, presyncope, syncope, leg pain, leg cramping. Review of 12 Systems is negative except as described in HPI.   Physical Examination   Vitals:BP 122/70  Pulse 81  Ht 154.9 cm (5\' 1" )  Wt 77.4 kg (170 lb 9.6 oz)  SpO2 98%  BMI 32.23 kg/m  Ht:154.9 cm (5\' 1" ) Wt:77.4 kg (170 lb 9.6 oz) HER:DEYC surface area is 1.83 meters squared. Body mass index is 32.23 kg/m.  General: Well developed, well nourished. In no acute distress HEENT: Pupils equally reactive to light and accomodation  Neck: Supple without thyromegaly, or goiter. Carotid pulses 2+. No carotid bruits present.  Pulmonary: Clear to auscultation bilaterally; no wheezes, rales, rhonchi Cardiovascular: Irregularly irregular, rate controlled. No gallops, murmurs or rubs Gastrointestinal: Soft nontender, nondistended, with normal bowel sounds Extremities: No cyanosis or clubbing. 1-2+ pitting edema in bilateral lower extremities  Peripheral Pulses: 2+ in upper extremities, 2+ in lower extremities  Neurology: Alert and oriented X3 Pysch: Good affect. Responds appropriately  Assessment and Plan   86 y.o. female with  1. New onset atrial fibrillation (CMS-HCC)  -Holter revealed sustained atrial fibrillation with occurrence of RVR; will begin diltiazem 120mg  daily  -With side effects of dizziness and diarrhea on Eliquis, will transition to Xarelto 20mg  daily  -Discussed cardioversion, however, patient wishes to hold off at this time and readdress in 4 weeks  2. Essential hypertension, benign  -Currently controlled on Lasix; diltiazem initiated at today's visit  3. Shortness of breath  -No significant abnormalities on echo; will continue to diureses with Lasix -Diltiazem 120mg  started at today's visit; encouraged cardioversion and will  proceed.  4. Lower extremity edema  -Continue Lasix 40mg  daily  -Encouraged use of compression stockings  -Stressed the importance of a low sodium diet     No orders of the defined types were placed in this encounter.  Return in about 4 weeks (around 04/29/2020).   Joan Ingram Joan Wisler MD   Pt seen and examined. NO change from above.

## 2020-04-21 NOTE — Transfer of Care (Signed)
Immediate Anesthesia Transfer of Care Note  Patient: Forde Radon  Procedure(s) Performed: CARDIOVERSION (N/A )  Patient Location: [facility]  Anesthesia Type:General  Level of Consciousness: awake, alert  and oriented  Airway & Oxygen Therapy: Patient Spontanous Breathing and Patient connected to nasal cannula oxygen  Post-op Assessment: Report given to RN and Post -op Vital signs reviewed and stable  Post vital signs: Reviewed and stable  Last Vitals:  Vitals Value Taken Time  BP 124/66 04/21/20 0739  Temp    Pulse 79 04/21/20 0740  Resp 19 04/21/20 0740  SpO2 99 % 04/21/20 0740  Vitals shown include unvalidated device data.  Last Pain:  Vitals:   04/21/20 0649  PainSc: 0-No pain         Complications: No complications documented.

## 2020-04-22 ENCOUNTER — Encounter: Payer: Self-pay | Admitting: Cardiology

## 2020-04-22 NOTE — Anesthesia Postprocedure Evaluation (Signed)
Anesthesia Post Note  Patient: Joan Ingram  Procedure(s) Performed: CARDIOVERSION (N/A )  Patient location during evaluation: [facility] Anesthesia Type: General Level of consciousness: awake and alert Pain management: pain level controlled Vital Signs Assessment: post-procedure vital signs reviewed and stable Respiratory status: spontaneous breathing, nonlabored ventilation, respiratory function stable and patient connected to nasal cannula oxygen Cardiovascular status: blood pressure returned to baseline and stable Postop Assessment: no apparent nausea or vomiting Anesthetic complications: no   No complications documented.   Last Vitals:  Vitals:   04/21/20 0815 04/21/20 0825  BP: 119/77 126/83  Pulse: 82 85  Resp: 16   SpO2: 95% 98%    Last Pain:  Vitals:   04/21/20 0825  PainSc: 0-No pain                 Martha Clan

## 2020-04-29 DIAGNOSIS — I10 Essential (primary) hypertension: Secondary | ICD-10-CM | POA: Diagnosis not present

## 2020-04-29 DIAGNOSIS — I4891 Unspecified atrial fibrillation: Secondary | ICD-10-CM | POA: Diagnosis not present

## 2020-05-06 DIAGNOSIS — I4891 Unspecified atrial fibrillation: Secondary | ICD-10-CM | POA: Diagnosis not present

## 2020-05-06 DIAGNOSIS — K449 Diaphragmatic hernia without obstruction or gangrene: Secondary | ICD-10-CM | POA: Diagnosis not present

## 2020-05-06 DIAGNOSIS — I517 Cardiomegaly: Secondary | ICD-10-CM | POA: Diagnosis not present

## 2020-05-06 DIAGNOSIS — J90 Pleural effusion, not elsewhere classified: Secondary | ICD-10-CM | POA: Diagnosis not present

## 2020-05-06 DIAGNOSIS — R0602 Shortness of breath: Secondary | ICD-10-CM | POA: Diagnosis not present

## 2020-05-06 DIAGNOSIS — J189 Pneumonia, unspecified organism: Secondary | ICD-10-CM | POA: Diagnosis not present

## 2020-05-13 DIAGNOSIS — I10 Essential (primary) hypertension: Secondary | ICD-10-CM | POA: Diagnosis not present

## 2020-05-13 DIAGNOSIS — R600 Localized edema: Secondary | ICD-10-CM | POA: Diagnosis not present

## 2020-05-13 DIAGNOSIS — I4891 Unspecified atrial fibrillation: Secondary | ICD-10-CM | POA: Diagnosis not present

## 2020-05-21 DIAGNOSIS — L821 Other seborrheic keratosis: Secondary | ICD-10-CM | POA: Diagnosis not present

## 2020-08-26 DIAGNOSIS — R0600 Dyspnea, unspecified: Secondary | ICD-10-CM | POA: Diagnosis not present

## 2020-08-26 DIAGNOSIS — R079 Chest pain, unspecified: Secondary | ICD-10-CM | POA: Diagnosis not present

## 2020-08-26 DIAGNOSIS — I4891 Unspecified atrial fibrillation: Secondary | ICD-10-CM | POA: Diagnosis not present

## 2020-08-26 DIAGNOSIS — I10 Essential (primary) hypertension: Secondary | ICD-10-CM | POA: Diagnosis not present

## 2020-09-29 DIAGNOSIS — Z961 Presence of intraocular lens: Secondary | ICD-10-CM | POA: Diagnosis not present

## 2020-10-14 DIAGNOSIS — R079 Chest pain, unspecified: Secondary | ICD-10-CM | POA: Diagnosis not present

## 2020-10-14 DIAGNOSIS — R0600 Dyspnea, unspecified: Secondary | ICD-10-CM | POA: Diagnosis not present

## 2020-10-28 DIAGNOSIS — J4520 Mild intermittent asthma, uncomplicated: Secondary | ICD-10-CM | POA: Diagnosis not present

## 2020-10-28 DIAGNOSIS — I480 Paroxysmal atrial fibrillation: Secondary | ICD-10-CM | POA: Diagnosis not present

## 2020-10-28 DIAGNOSIS — Z23 Encounter for immunization: Secondary | ICD-10-CM | POA: Diagnosis not present

## 2020-10-28 DIAGNOSIS — R0602 Shortness of breath: Secondary | ICD-10-CM | POA: Diagnosis not present

## 2020-10-28 DIAGNOSIS — I10 Essential (primary) hypertension: Secondary | ICD-10-CM | POA: Diagnosis not present

## 2021-02-01 DIAGNOSIS — I4891 Unspecified atrial fibrillation: Secondary | ICD-10-CM | POA: Diagnosis not present

## 2021-02-01 DIAGNOSIS — R0602 Shortness of breath: Secondary | ICD-10-CM | POA: Diagnosis not present

## 2021-02-01 DIAGNOSIS — I480 Paroxysmal atrial fibrillation: Secondary | ICD-10-CM | POA: Diagnosis not present

## 2021-02-01 DIAGNOSIS — I10 Essential (primary) hypertension: Secondary | ICD-10-CM | POA: Diagnosis not present

## 2021-02-01 DIAGNOSIS — J4521 Mild intermittent asthma with (acute) exacerbation: Secondary | ICD-10-CM | POA: Diagnosis not present

## 2021-02-08 DIAGNOSIS — D649 Anemia, unspecified: Secondary | ICD-10-CM | POA: Diagnosis not present

## 2021-02-08 DIAGNOSIS — J4540 Moderate persistent asthma, uncomplicated: Secondary | ICD-10-CM | POA: Diagnosis not present

## 2021-03-04 DIAGNOSIS — D2271 Melanocytic nevi of right lower limb, including hip: Secondary | ICD-10-CM | POA: Diagnosis not present

## 2021-03-04 DIAGNOSIS — L570 Actinic keratosis: Secondary | ICD-10-CM | POA: Diagnosis not present

## 2021-03-04 DIAGNOSIS — D2261 Melanocytic nevi of right upper limb, including shoulder: Secondary | ICD-10-CM | POA: Diagnosis not present

## 2021-03-04 DIAGNOSIS — X32XXXA Exposure to sunlight, initial encounter: Secondary | ICD-10-CM | POA: Diagnosis not present

## 2021-03-04 DIAGNOSIS — D2262 Melanocytic nevi of left upper limb, including shoulder: Secondary | ICD-10-CM | POA: Diagnosis not present

## 2021-03-04 DIAGNOSIS — L853 Xerosis cutis: Secondary | ICD-10-CM | POA: Diagnosis not present

## 2021-03-04 DIAGNOSIS — Z85828 Personal history of other malignant neoplasm of skin: Secondary | ICD-10-CM | POA: Diagnosis not present

## 2021-03-04 DIAGNOSIS — D225 Melanocytic nevi of trunk: Secondary | ICD-10-CM | POA: Diagnosis not present

## 2021-05-10 DIAGNOSIS — Z20822 Contact with and (suspected) exposure to covid-19: Secondary | ICD-10-CM | POA: Diagnosis not present

## 2021-05-10 DIAGNOSIS — J4540 Moderate persistent asthma, uncomplicated: Secondary | ICD-10-CM | POA: Diagnosis not present

## 2021-05-10 DIAGNOSIS — Z01818 Encounter for other preprocedural examination: Secondary | ICD-10-CM | POA: Diagnosis not present

## 2021-05-12 DIAGNOSIS — I480 Paroxysmal atrial fibrillation: Secondary | ICD-10-CM | POA: Diagnosis not present

## 2021-05-12 DIAGNOSIS — I10 Essential (primary) hypertension: Secondary | ICD-10-CM | POA: Diagnosis not present

## 2021-08-02 DIAGNOSIS — Z0000 Encounter for general adult medical examination without abnormal findings: Secondary | ICD-10-CM | POA: Diagnosis not present

## 2021-08-02 DIAGNOSIS — I10 Essential (primary) hypertension: Secondary | ICD-10-CM | POA: Diagnosis not present

## 2021-08-02 DIAGNOSIS — I509 Heart failure, unspecified: Secondary | ICD-10-CM | POA: Diagnosis not present

## 2021-08-02 DIAGNOSIS — R739 Hyperglycemia, unspecified: Secondary | ICD-10-CM | POA: Diagnosis not present

## 2021-08-02 DIAGNOSIS — Z780 Asymptomatic menopausal state: Secondary | ICD-10-CM | POA: Diagnosis not present

## 2021-08-02 DIAGNOSIS — I480 Paroxysmal atrial fibrillation: Secondary | ICD-10-CM | POA: Diagnosis not present

## 2021-08-02 DIAGNOSIS — R601 Generalized edema: Secondary | ICD-10-CM | POA: Diagnosis not present

## 2021-08-02 DIAGNOSIS — I5032 Chronic diastolic (congestive) heart failure: Secondary | ICD-10-CM | POA: Diagnosis not present

## 2021-08-04 DIAGNOSIS — I10 Essential (primary) hypertension: Secondary | ICD-10-CM | POA: Diagnosis not present

## 2021-08-04 DIAGNOSIS — I5032 Chronic diastolic (congestive) heart failure: Secondary | ICD-10-CM | POA: Insufficient documentation

## 2021-08-04 DIAGNOSIS — I480 Paroxysmal atrial fibrillation: Secondary | ICD-10-CM | POA: Diagnosis not present

## 2021-08-18 DIAGNOSIS — I10 Essential (primary) hypertension: Secondary | ICD-10-CM | POA: Diagnosis not present

## 2021-08-18 DIAGNOSIS — I480 Paroxysmal atrial fibrillation: Secondary | ICD-10-CM | POA: Diagnosis not present

## 2021-08-23 DIAGNOSIS — I480 Paroxysmal atrial fibrillation: Secondary | ICD-10-CM | POA: Diagnosis not present

## 2021-08-23 DIAGNOSIS — D509 Iron deficiency anemia, unspecified: Secondary | ICD-10-CM | POA: Diagnosis not present

## 2021-08-30 DIAGNOSIS — D509 Iron deficiency anemia, unspecified: Secondary | ICD-10-CM | POA: Diagnosis not present

## 2021-09-15 ENCOUNTER — Encounter: Payer: Self-pay | Admitting: *Deleted

## 2021-09-15 DIAGNOSIS — D509 Iron deficiency anemia, unspecified: Secondary | ICD-10-CM | POA: Insufficient documentation

## 2021-09-15 NOTE — Progress Notes (Signed)
[place]  [phone] [phone]  ID: Joan Ingram OB: 26-Sep-1929  MR#: [id number]  XTG#:[id number]  Patient Care Team: Gladstone Lighter, MD as PCP - General (Internal Medicine) Ottie Glazier, MD as Consulting Physician (Pulmonary Disease) Ubaldo Glassing Javier Docker, MD as Consulting Physician (Cardiology) Marval Regal, NP as Nurse Practitioner (Gastroenterology) Lloyd Huger, MD as Consulting Physician (Hematology and Oncology)  CHIEF COMPLAINT: Iron deficiency anemia.  INTERVAL HISTORY: Patient is an 87 year old female with a longstanding history of iron deficiency anemia cannot tolerate oral iron supplementation.  Given her advanced age, she has refused any further colonoscopies.  She currently feels well and is asymptomatic.  She does not complain of any weakness or fatigue.  She has no neurologic complaints.  She denies any recent fevers or illnesses.  She has a good appetite and denies weight loss.  She has no chest pain, shortness of breath, cough, or hemoptysis.  She denies any nausea, vomiting, constipation, or diarrhea.  She has no melena or hematochezia.  She has no urinary complaints.  Patient offers no specific complaints today.  REVIEW OF SYSTEMS:   Review of Systems  Constitutional: Negative.  Negative for fever, malaise/fatigue and weight loss.  Respiratory: Negative.  Negative for cough, hemoptysis and shortness of breath.   Cardiovascular: Negative.  Negative for chest pain and leg swelling.  Gastrointestinal: Negative.  Negative for abdominal pain, blood in stool and melena.  Genitourinary: Negative.  Negative for hematuria.  Musculoskeletal: Negative.  Negative for back pain.  Skin: Negative.   Neurological: Negative.  Negative for dizziness, seizures, weakness and headaches.  Psychiatric/Behavioral: Negative.  The patient is not nervous/anxious.    As per HPI. Otherwise, a complete review of systems is  negative.  PAST MEDICAL HISTORY: Past Medical History:  Diagnosis Date   Asthma    Esophageal reflux    Essential hypertension    IDA (iron deficiency anemia)    Paroxysmal atrial fibrillation (HCC)    Senile osteoporosis     PAST SURGICAL HISTORY: Past Surgical History:  Procedure Laterality Date   CARDIOVERSION N/A 04/21/2020   Procedure: CARDIOVERSION;  Surgeon: Teodoro Spray, MD;  Location: [facility] ORS;  Service: Cardiovascular;  Laterality: N/A;   colonoscopy      FAMILY HISTORY: History reviewed. No pertinent family history.  ADVANCED DIRECTIVES (Y/N):  N  HEALTH MAINTENANCE: Social History   Tobacco Use   Smoking status: Never   Smokeless tobacco: Never  Substance Use Topics   Alcohol use: No     Colonoscopy:  PAP:  Bone density:  Lipid panel:  Allergies  Allergen Reactions   Other Anaphylaxis   Aspirin    Astemizole Other (See Comments)   Benadryl [Diphenhydramine Hcl]    Ciprofloxacin    Codeine    Doxycycline    Fexofenadine Other (See Comments)   Lansoprazole Diarrhea   Levofloxacin Swelling   Loratadine Other (See Comments)   Macrobid [Nitrofurantoin Macrocrystal]    Morphine And Related    Omeprazole Diarrhea   Oxycodone    Pantoprazole Diarrhea   Penicillins    Rabeprazole Diarrhea   Terfenadine Swelling    Current Outpatient Medications  Medication Sig Dispense Refill   ADVAIR HFA 115-21 MCG/ACT inhaler Inhale 2 puffs into the lungs 2 (two) times daily.     albuterol (VENTOLIN HFA) 108 (90 Base) MCG/ACT inhaler Inhale 2 puffs into the lungs every 6 (six) hours as needed for wheezing or shortness of breath.     furosemide (  LASIX) 40 MG tablet Take 40 mg by mouth.     rivaroxaban (XARELTO) 20 MG TABS tablet Take 20 mg by mouth daily with supper.     diltiazem (CARDIZEM CD) 120 MG 24 hr capsule Take 120 mg by mouth daily. (Patient not taking: Reported on 09/20/2021)     No current facility-administered medications for this visit.     OBJECTIVE: Vitals:   09/20/21 1327  BP: 134/73  Pulse: 83  Resp: 16  Temp: (!) 97.2 F (36.2 C)  SpO2: 100%     Body mass index is 28.97 kg/m.    ECOG FS:0 - Asymptomatic  General: Well-developed, well-nourished, no acute distress. Eyes: Pink conjunctiva, anicteric sclera. HEENT: Normocephalic, moist mucous membranes. Lungs: No audible wheezing or coughing. Heart: Regular rate and rhythm. Abdomen: Soft, nontender, no obvious distention. Musculoskeletal: No edema, cyanosis, or clubbing. Neuro: Alert, answering all questions appropriately. Cranial nerves grossly intact. Skin: No rashes or petechiae noted. Psych: Normal affect. Lymphatics: No cervical, calvicular, axillary or inguinal LAD.   LAB RESULTS:  Lab Results  Component Value Date   NA 137 11/17/2013   K 3.9 11/17/2013   CL 104 11/17/2013   CO2 27 11/17/2013   GLUCOSE 138 (H) 11/17/2013   BUN 12 11/17/2013   CREATININE 0.78 11/17/2013   CALCIUM 8.5 11/17/2013   GFRNONAA >60 11/17/2013   GFRAA >60 11/17/2013    Lab Results  Component Value Date   WBC 7.8 11/17/2013   NEUTROABS 6.5 11/17/2013   HGB 9.7 (L) 11/17/2013   HCT 28.3 (L) 11/17/2013   MCV 94 11/17/2013   PLT 231 11/17/2013     STUDIES: No results found.  ASSESSMENT: Iron deficiency anemia.  PLAN:    Iron deficiency anemia: Patient's most recent hemoglobin is 8.1 with a ferritin of 7, percent saturation of 6%, and a total iron of 28.  She cannot tolerate oral iron supplementation.  Patient has declined any further colonoscopies.  Proceed with 5 doses of 200 mg IV Venofer over the next 2 to 3 weeks.  Patient will then return to clinic in 3 months with repeat laboratory work, further evaluation, and continuation of treatment if needed.  I spent a total of 45 minutes reviewing chart data, face-to-face evaluation with the patient, counseling and coordination of care as detailed above.   Patient expressed understanding and was in agreement  with this plan. She also understands that She can call clinic at any time with any questions, concerns, or complaints.     Lloyd Huger, MD   09/20/2021 7:39 PM

## 2021-09-20 ENCOUNTER — Other Ambulatory Visit: Payer: Self-pay

## 2021-09-20 ENCOUNTER — Inpatient Hospital Stay: Payer: PPO

## 2021-09-20 ENCOUNTER — Inpatient Hospital Stay: Payer: PPO | Attending: Oncology | Admitting: Oncology

## 2021-09-20 ENCOUNTER — Encounter: Payer: Self-pay | Admitting: Oncology

## 2021-09-20 DIAGNOSIS — I480 Paroxysmal atrial fibrillation: Secondary | ICD-10-CM | POA: Insufficient documentation

## 2021-09-20 DIAGNOSIS — Z79899 Other long term (current) drug therapy: Secondary | ICD-10-CM | POA: Insufficient documentation

## 2021-09-20 DIAGNOSIS — D509 Iron deficiency anemia, unspecified: Secondary | ICD-10-CM | POA: Diagnosis not present

## 2021-09-20 DIAGNOSIS — M810 Age-related osteoporosis without current pathological fracture: Secondary | ICD-10-CM | POA: Diagnosis not present

## 2021-09-22 ENCOUNTER — Other Ambulatory Visit: Payer: Self-pay

## 2021-09-22 ENCOUNTER — Inpatient Hospital Stay: Payer: PPO

## 2021-09-22 VITALS — BP 135/71 | HR 90 | Temp 98.1°F | Resp 18

## 2021-09-22 DIAGNOSIS — D509 Iron deficiency anemia, unspecified: Secondary | ICD-10-CM

## 2021-09-22 MED ORDER — IRON SUCROSE 20 MG/ML IV SOLN
200.0000 mg | Freq: Once | INTRAVENOUS | Status: AC
  Administered 2021-09-22: 200 mg via INTRAVENOUS
  Filled 2021-09-22: qty 10

## 2021-09-22 MED ORDER — SODIUM CHLORIDE 0.9 % IV SOLN: 200.0000 mg | Freq: Once | INTRAVENOUS | Status: DC

## 2021-09-22 MED ORDER — SODIUM CHLORIDE 0.9 % IV SOLN
Freq: Once | INTRAVENOUS | Status: AC
  Filled 2021-09-22: qty 250

## 2021-09-22 NOTE — Patient Instructions (Signed)
[facility] CANCER CTR AT [place]  Discharge Instructions: Thank you for choosing [facility] to provide your oncology and hematology care.  If you have a lab appointment with the [facility], please go directly to the [facility] and check in at the registration area.  Wear comfortable clothing and clothing appropriate for easy access to any Portacath or PICC line.   We strive to give you quality time with your provider. You may need to reschedule your appointment if you arrive late (15 or more minutes).  Arriving late affects you and other patients whose appointments are after yours.  Also, if you miss three or more appointments without notifying the office, you may be dismissed from the clinic at the providers discretion.      For prescription refill requests, have your pharmacy contact our office and allow 72 hours for refills to be completed.    Today you received the following chemotherapy and/or immunotherapy agents venofer       To help prevent nausea and vomiting after your treatment, we encourage you to take your nausea medication as directed.  BELOW ARE SYMPTOMS THAT SHOULD BE REPORTED IMMEDIATELY: *FEVER GREATER THAN 100.4 F (38 C) OR HIGHER *CHILLS OR SWEATING *NAUSEA AND VOMITING THAT IS NOT CONTROLLED WITH YOUR NAUSEA MEDICATION *UNUSUAL SHORTNESS OF BREATH *UNUSUAL BRUISING OR BLEEDING *URINARY PROBLEMS (pain or burning when urinating, or frequent urination) *BOWEL PROBLEMS (unusual diarrhea, constipation, pain near the anus) TENDERNESS IN MOUTH AND THROAT WITH OR WITHOUT PRESENCE OF ULCERS (sore throat, sores in mouth, or a toothache) UNUSUAL RASH, SWELLING OR PAIN  UNUSUAL VAGINAL DISCHARGE OR ITCHING   Items with * indicate a potential emergency and should be followed up as soon as possible or go to the Emergency Department if any problems should occur.  Please show the CHEMOTHERAPY ALERT CARD or IMMUNOTHERAPY ALERT CARD at check-in to  the Emergency Department and triage nurse.  Should you have questions after your visit or need to cancel or reschedule your appointment, please contact [facility] CANCER [place] AT [place]  [phone] and follow the prompts.  Office hours are 8:00 a.m. to 4:30 p.m. Monday - Friday. Please note that voicemails left after 4:00 p.m. may not be returned until the following business day.  We are closed weekends and major holidays. You have access to a nurse at all times for urgent questions. Please call the main number to the clinic [phone] and follow the prompts.  For any non-urgent questions, you may also contact your provider using MyChart. We now offer e-Visits for anyone 40 and older to request care online for non-urgent symptoms. For details visit [email or web address].   Also download the MyChart app! Go to the app store, search "MyChart", open the app, select [facility], and log in with your MyChart username and password.  Due to Covid, a mask is required upon entering the hospital/clinic. If you do not have a mask, one will be given to you upon arrival. For doctor visits, patients may have 1 support person aged 100 or older with them. For treatment visits, patients cannot have anyone with them due to current Covid guidelines and our immunocompromised population.   Iron Sucrose Injection What is this medication? IRON SUCROSE (EYE ern SOO krose) treats low levels of iron (iron deficiency anemia) in people with kidney disease. Iron is a mineral that plays an important role in making red blood cells, which carry oxygen from your lungs to the rest of your body. This medicine  may be used for other purposes; ask your health care provider or pharmacist if you have questions. COMMON BRAND NAME(S): Venofer What should I tell my care team before I take this medication? They need to know if you have any of these conditions: Anemia not caused by low iron levels Heart disease High levels of  iron in the blood Kidney disease Liver disease An unusual or allergic reaction to iron, other medications, foods, dyes, or preservatives Pregnant or trying to get pregnant Breast-feeding How should I use this medication? This medication is for infusion into a vein. It is given in a hospital or clinic setting. Talk to your care team about the use of this medication in children. While this medication may be prescribed for children as young as 2 years for selected conditions, precautions do apply. Overdosage: If you think you have taken too much of this medicine contact a poison control center or emergency room at once. NOTE: This medicine is only for you. Do not share this medicine with others. What if I miss a dose? It is important not to miss your dose. Call your care team if you are unable to keep an appointment. What may interact with this medication? Do not take this medication with any of the following: Deferoxamine Dimercaprol Other iron products This medication may also interact with the following: Chloramphenicol Deferasirox This list may not describe all possible interactions. Give your health care provider a list of all the medicines, herbs, non-prescription drugs, or dietary supplements you use. Also tell them if you smoke, drink alcohol, or use illegal drugs. Some items may interact with your medicine. What should I watch for while using this medication? Visit your care team regularly. Tell your care team if your symptoms do not start to get better or if they get worse. You may need blood work done while you are taking this medication. You may need to follow a special diet. Talk to your care team. Foods that contain iron include: whole grains/cereals, dried fruits, beans, or peas, leafy green vegetables, and organ meats (liver, kidney). What side effects may I notice from receiving this medication? Side effects that you should report to your care team as soon as  possible: Allergic reactions--skin rash, itching, hives, swelling of the face, lips, tongue, or throat Low blood pressure--dizziness, feeling faint or lightheaded, blurry vision Shortness of breath Side effects that usually do not require medical attention (report to your care team if they continue or are bothersome): Flushing Headache Joint pain Muscle pain Nausea Pain, redness, or irritation at injection site This list may not describe all possible side effects. Call your doctor for medical advice about side effects. You may report side effects to FDA at 1-800-FDA-1088. Where should I keep my medication? This medication is given in a hospital or clinic and will not be stored at home. NOTE: This sheet is a summary. It may not cover all possible information. If you have questions about this medicine, talk to your doctor, pharmacist, or health care provider.  2022 Elsevier/Gold Standard (2021-01-28 00:00:00)

## 2021-09-27 ENCOUNTER — Other Ambulatory Visit: Payer: Self-pay

## 2021-09-27 ENCOUNTER — Encounter: Payer: Self-pay | Admitting: Oncology

## 2021-09-27 ENCOUNTER — Inpatient Hospital Stay: Payer: PPO

## 2021-09-27 VITALS — BP 136/81 | HR 88 | Temp 98.7°F | Resp 18

## 2021-09-27 DIAGNOSIS — D509 Iron deficiency anemia, unspecified: Secondary | ICD-10-CM

## 2021-09-27 MED ORDER — SODIUM CHLORIDE 0.9 % IV SOLN
Freq: Once | INTRAVENOUS | Status: AC
  Filled 2021-09-27: qty 250

## 2021-09-27 MED ORDER — IRON SUCROSE 20 MG/ML IV SOLN
200.0000 mg | Freq: Once | INTRAVENOUS | Status: AC
  Administered 2021-09-27: 200 mg via INTRAVENOUS
  Filled 2021-09-27: qty 10

## 2021-09-27 MED ORDER — SODIUM CHLORIDE 0.9 % IV SOLN: 200.0000 mg | Freq: Once | INTRAVENOUS | Status: DC

## 2021-09-27 NOTE — Patient Instructions (Addendum)
Apply warm compresses to right antecubital space several times a day for 15 minutes for the next couple of days and elevate as tolerated.

## 2021-09-27 NOTE — Progress Notes (Signed)
Patient received 2nd dose of IV venofer today. IV infiltrated at the end of infusion. Discoloration and bruising noted to the right antecubital space. Patient never complained of pain or discomfort during infusion. IV discontinued and warm compress applied. Patient given instructions to apply warm compresses at home. Patient denied pain when discharged.

## 2021-09-27 NOTE — Progress Notes (Signed)
After further consideration and discussion with the charge nurse, it is possible that the iv had not infiltrated but instead there may have been bleeding around the catheter under the skin, as the appearance of the discoloration was more of a bluish tint. Will try to reach patient via telephone to check on her status.

## 2021-09-28 ENCOUNTER — Encounter: Payer: Self-pay | Admitting: Oncology

## 2021-09-29 ENCOUNTER — Other Ambulatory Visit: Payer: Self-pay

## 2021-09-29 ENCOUNTER — Inpatient Hospital Stay: Payer: PPO

## 2021-09-29 VITALS — BP 142/84 | HR 99 | Temp 97.2°F | Resp 18

## 2021-09-29 DIAGNOSIS — D509 Iron deficiency anemia, unspecified: Secondary | ICD-10-CM

## 2021-09-29 MED ORDER — IRON SUCROSE 20 MG/ML IV SOLN
200.0000 mg | Freq: Once | INTRAVENOUS | Status: AC
  Administered 2021-09-29: 200 mg via INTRAVENOUS
  Filled 2021-09-29: qty 10

## 2021-09-29 MED ORDER — SODIUM CHLORIDE 0.9 % IV SOLN: 200.0000 mg | Freq: Once | INTRAVENOUS | Status: DC

## 2021-09-29 MED ORDER — SODIUM CHLORIDE 0.9 % IV SOLN
Freq: Once | INTRAVENOUS | Status: AC
  Filled 2021-09-29: qty 250

## 2021-09-29 NOTE — Patient Instructions (Signed)
[facility] AT [place]  Discharge Instructions: ?Thank you for choosing [facility] to provide your oncology and hematology care.  ?If you have a lab appointment with the Cancer Center, please go directly to the Cancer Center and check in at the registration area. ? ?Wear comfortable clothing and clothing appropriate for easy access to any Portacath or PICC line.  ? ?We strive to give you quality time with your provider. You may need to reschedule your appointment if you arrive late (15 or more minutes).  Arriving late affects you and other patients whose appointments are after yours.  Also, if you miss three or more appointments without notifying the office, you may be dismissed from the clinic at the provider?s discretion.    ?  ?For prescription refill requests, have your pharmacy contact our office and allow 72 hours for refills to be completed.   ? ?Today you received the following chemotherapy and/or immunotherapy agents VENOFER    ?  ?To help prevent nausea and vomiting after your treatment, we encourage you to take your nausea medication as directed. ? ?BELOW ARE SYMPTOMS THAT SHOULD BE REPORTED IMMEDIATELY: ?*FEVER GREATER THAN 100.4 F (38 ?C) OR HIGHER ?*CHILLS OR SWEATING ?*NAUSEA AND VOMITING THAT IS NOT CONTROLLED WITH YOUR NAUSEA MEDICATION ?*UNUSUAL SHORTNESS OF BREATH ?*UNUSUAL BRUISING OR BLEEDING ?*URINARY PROBLEMS (pain or burning when urinating, or frequent urination) ?*BOWEL PROBLEMS (unusual diarrhea, constipation, pain near the anus) ?TENDERNESS IN MOUTH AND THROAT WITH OR WITHOUT PRESENCE OF ULCERS (sore throat, sores in mouth, or a toothache) ?UNUSUAL RASH, SWELLING OR PAIN  ?UNUSUAL VAGINAL DISCHARGE OR ITCHING  ? ?Items with * indicate a potential emergency and should be followed up as soon as possible or go to the Emergency Department if any problems should occur. ? ?Please show the CHEMOTHERAPY ALERT CARD or IMMUNOTHERAPY ALERT CARD at check-in to the  Emergency Department and triage nurse. ? ?Should you have questions after your visit or need to cancel or reschedule your appointment, please contact [facility] AT [place]  [phone] and follow the prompts.  Office hours are 8:00 a.m. to 4:30 p.m. Monday - Friday. Please note that voicemails left after 4:00 p.m. may not be returned until the following business day.  We are closed weekends and major holidays. You have access to a nurse at all times for urgent questions. Please call the main number to the clinic [phone] and follow the prompts. ? ?For any non-urgent questions, you may also contact your provider using MyChart. We now offer e-Visits for anyone 18 and older to request care online for non-urgent symptoms. For details visit [email or web address]. ?  ?Also download the MyChart app! Go to the app store, search "MyChart", open the app, select , and log in with your MyChart username and password. ? ?Due to Covid, a mask is required upon entering the hospital/clinic. If you do not have a mask, one will be given to you upon arrival. For doctor visits, patients may have 1 support person aged 18 or older with them. For treatment visits, patients cannot have anyone with them due to current Covid guidelines and our immunocompromised population.  ? ?Iron Sucrose Injection ?What is this medication? ?IRON SUCROSE (EYE ern SOO krose) treats low levels of iron (iron deficiency anemia) in people with kidney disease. Iron is a mineral that plays an important role in making red blood cells, which carry oxygen from your lungs to the rest of your body. ?This medicine may   be used for other purposes; ask your health care provider or pharmacist if you have questions. ?COMMON BRAND NAME(S): Venofer ?What should I tell my care team before I take this medication? ?They need to know if you have any of these conditions: ?Anemia not caused by low iron levels ?Heart disease ?High levels of  iron in the blood ?Kidney disease ?Liver disease ?An unusual or allergic reaction to iron, other medications, foods, dyes, or preservatives ?Pregnant or trying to get pregnant ?Breast-feeding ?How should I use this medication? ?This medication is for infusion into a vein. It is given in a hospital or clinic setting. ?Talk to your care team about the use of this medication in children. While this medication may be prescribed for children as young as 2 years for selected conditions, precautions do apply. ?Overdosage: If you think you have taken too much of this medicine contact a poison control center or emergency room at once. ?NOTE: This medicine is only for you. Do not share this medicine with others. ?What if I miss a dose? ?It is important not to miss your dose. Call your care team if you are unable to keep an appointment. ?What may interact with this medication? ?Do not take this medication with any of the following: ?Deferoxamine ?Dimercaprol ?Other iron products ?This medication may also interact with the following: ?Chloramphenicol ?Deferasirox ?This list may not describe all possible interactions. Give your health care provider a list of all the medicines, herbs, non-prescription drugs, or dietary supplements you use. Also tell them if you smoke, drink alcohol, or use illegal drugs. Some items may interact with your medicine. ?What should I watch for while using this medication? ?Visit your care team regularly. Tell your care team if your symptoms do not start to get better or if they get worse. You may need blood work done while you are taking this medication. ?You may need to follow a special diet. Talk to your care team. Foods that contain iron include: whole grains/cereals, dried fruits, beans, or peas, leafy green vegetables, and organ meats (liver, kidney). ?What side effects may I notice from receiving this medication? ?Side effects that you should report to your care team as soon as  possible: ?Allergic reactions--skin rash, itching, hives, swelling of the face, lips, tongue, or throat ?Low blood pressure--dizziness, feeling faint or lightheaded, blurry vision ?Shortness of breath ?Side effects that usually do not require medical attention (report to your care team if they continue or are bothersome): ?Flushing ?Headache ?Joint pain ?Muscle pain ?Nausea ?Pain, redness, or irritation at injection site ?This list may not describe all possible side effects. Call your doctor for medical advice about side effects. You may report side effects to FDA at 1-800-FDA-1088. ?Where should I keep my medication? ?This medication is given in a hospital or clinic and will not be stored at home. ?NOTE: This sheet is a summary. It may not cover all possible information. If you have questions about this medicine, talk to your doctor, pharmacist, or health care provider. ?? 2022 Elsevier/Gold Standard (2021-01-28 00:00:00) ? ?

## 2021-09-29 NOTE — Progress Notes (Signed)
Rt antecubital IV site form 09/27/20  assessed, No bruising, redness or pain noted. Pt states that there was no problem.

## 2021-10-04 ENCOUNTER — Inpatient Hospital Stay: Payer: PPO

## 2021-10-04 ENCOUNTER — Other Ambulatory Visit: Payer: Self-pay

## 2021-10-04 VITALS — BP 119/69 | HR 72 | Temp 97.0°F | Resp 18

## 2021-10-04 DIAGNOSIS — D509 Iron deficiency anemia, unspecified: Secondary | ICD-10-CM | POA: Diagnosis not present

## 2021-10-04 MED ORDER — SODIUM CHLORIDE 0.9 % IV SOLN
Freq: Once | INTRAVENOUS | Status: AC
  Filled 2021-10-04: qty 250

## 2021-10-04 MED ORDER — SODIUM CHLORIDE 0.9 % IV SOLN: 200.0000 mg | Freq: Once | INTRAVENOUS | Status: DC

## 2021-10-04 MED ORDER — IRON SUCROSE 20 MG/ML IV SOLN
200.0000 mg | Freq: Once | INTRAVENOUS | Status: AC
  Administered 2021-10-04: 200 mg via INTRAVENOUS
  Filled 2021-10-04: qty 10

## 2021-10-06 ENCOUNTER — Inpatient Hospital Stay: Payer: PPO

## 2021-10-06 ENCOUNTER — Other Ambulatory Visit: Payer: Self-pay

## 2021-10-06 VITALS — BP 121/69 | HR 74 | Temp 97.5°F | Resp 18

## 2021-10-06 DIAGNOSIS — D509 Iron deficiency anemia, unspecified: Secondary | ICD-10-CM

## 2021-10-06 MED ORDER — SODIUM CHLORIDE 0.9 % IV SOLN: 200.0000 mg | Freq: Once | INTRAVENOUS | Status: DC

## 2021-10-06 MED ORDER — IRON SUCROSE 20 MG/ML IV SOLN
200.0000 mg | Freq: Once | INTRAVENOUS | Status: AC
  Administered 2021-10-06: 200 mg via INTRAVENOUS
  Filled 2021-10-06: qty 10

## 2021-10-06 MED ORDER — SODIUM CHLORIDE 0.9 % IV SOLN
Freq: Once | INTRAVENOUS | Status: AC
  Filled 2021-10-06: qty 250

## 2021-12-14 ENCOUNTER — Other Ambulatory Visit: Payer: Self-pay | Admitting: *Deleted

## 2021-12-14 DIAGNOSIS — D509 Iron deficiency anemia, unspecified: Secondary | ICD-10-CM

## 2021-12-18 NOTE — Progress Notes (Signed)
?[place]  ?[phone] [phone] ? ?ID: Forde Radon OB: [birth year]  MR#: [id number]  WGY#:[id number] ? ?Patient Care Team: ?Gladstone Lighter, MD as PCP - General (Internal Medicine) ?Ottie Glazier, MD as Consulting Physician (Pulmonary Disease) ?Teodoro Spray, MD as Consulting Physician (Cardiology) ?Marval Regal, NP as Nurse Practitioner (Gastroenterology) ?Lloyd Huger, MD as Consulting Physician (Hematology and Oncology) ? ?CHIEF COMPLAINT: Iron deficiency anemia. ? ?INTERVAL HISTORY: Patient returns to clinic today for repeat laboratory work and further evaluation.  She continues to feel well and remains asymptomatic.  She denies any weakness or fatigue.  She has no neurologic complaints.  She denies any recent fevers or illnesses.  She has a good appetite and denies weight loss.  She has no chest pain, shortness of breath, cough, or hemoptysis.  She denies any nausea, vomiting, constipation, or diarrhea.  She has no melena or hematochezia.  She has no urinary complaints.  Patient offers no specific complaints today. ? ?REVIEW OF SYSTEMS:   ?Review of Systems  ?Constitutional: Negative.  Negative for fever, malaise/fatigue and weight loss.  ?Respiratory: Negative.  Negative for cough, hemoptysis and shortness of breath.   ?Cardiovascular: Negative.  Negative for chest pain and leg swelling.  ?Gastrointestinal: Negative.  Negative for abdominal pain, blood in stool and melena.  ?Genitourinary: Negative.  Negative for hematuria.  ?Musculoskeletal: Negative.  Negative for back pain.  ?Skin: Negative.   ?Neurological: Negative.  Negative for dizziness, seizures, weakness and headaches.  ?Psychiatric/Behavioral: Negative.  The patient is not nervous/anxious.   ? ?As per HPI. Otherwise, a complete review of systems is negative. ? ?PAST MEDICAL HISTORY: ?Past Medical History:  ?Diagnosis Date  ? Asthma   ? Esophageal reflux   ? Essential hypertension    ? IDA (iron deficiency anemia)   ? Paroxysmal atrial fibrillation (HCC)   ? Senile osteoporosis   ? ? ?PAST SURGICAL HISTORY: ?Past Surgical History:  ?Procedure Laterality Date  ? CARDIOVERSION N/A 04/21/2020  ? Procedure: CARDIOVERSION;  Surgeon: Teodoro Spray, MD;  Location: [facility] ORS;  Service: Cardiovascular;  Laterality: N/A;  ? colonoscopy    ? ? ?FAMILY HISTORY: ?No family history on file. ? ?ADVANCED DIRECTIVES (Y/N):  N ? ?HEALTH MAINTENANCE: ?Social History  ? ?Tobacco Use  ? Smoking status: Never  ? Smokeless tobacco: Never  ?Substance Use Topics  ? Alcohol use: No  ? ? ? Colonoscopy: ? PAP: ? Bone density: ? Lipid panel: ? ?Allergies  ?Allergen Reactions  ? Other Anaphylaxis  ? Aspirin   ? Astemizole Other (See Comments)  ? Benadryl [Diphenhydramine Hcl]   ? Ciprofloxacin   ? Codeine   ? Doxycycline   ? Fexofenadine Other (See Comments)  ? Lansoprazole Diarrhea  ? Levofloxacin Swelling  ? Loratadine Other (See Comments)  ? Macrobid [Nitrofurantoin Macrocrystal]   ? Morphine And Related   ? Omeprazole Diarrhea  ? Oxycodone   ? Pantoprazole Diarrhea  ? Penicillins   ? Rabeprazole Diarrhea  ? Terfenadine Swelling  ? ? ?Current Outpatient Medications  ?Medication Sig Dispense Refill  ? ADVAIR HFA 115-21 MCG/ACT inhaler Inhale 2 puffs into the lungs 2 (two) times daily.    ? furosemide (LASIX) 40 MG tablet Take 40 mg by mouth.    ? rivaroxaban (XARELTO) 20 MG TABS tablet Take 20 mg by mouth daily with supper.    ? albuterol (VENTOLIN HFA) 108 (90 Base) MCG/ACT inhaler Inhale 2 puffs into the lungs every 6 (six) hours as  needed for wheezing or shortness of breath. (Patient not taking: Reported on 09/22/2021)    ? diltiazem (CARDIZEM CD) 120 MG 24 hr capsule Take 120 mg by mouth daily. (Patient not taking: Reported on 09/20/2021)    ? ?No current facility-administered medications for this visit.  ? ? ?OBJECTIVE: ?Vitals:  ? 12/22/21 1406  ?BP: 134/83  ?Pulse: 76  ?Resp: 16  ?Temp: 97.9 ?F (36.6 ?C)  ?SpO2: 97%   ?   Body mass index is 29.25 kg/m?Mar[place] Kitchen    ECOG FS:0 - Asymptomatic ? ?General: Well-developed, well-nourished, no acute distress. ?Eyes: Pink conjunctiva, anicteric sclera. ?HEENT: Normocephalic, moist mucous membranes. ?Lungs: No audible wheezing or coughing. ?Heart: Regular rate and rhythm. ?Abdomen: Soft, nontender, no obvious distention. ?Musculoskeletal: No edema, cyanosis, or clubbing. ?Neuro: Alert, answering all questions appropriately. Cranial nerves grossly intact. ?Skin: No rashes or petechiae noted. ?Psych: Normal affect. ? ? ?LAB RESULTS: ? ?Lab Results  ?Component Value Date  ? NA 137 11/17/2013  ? K 3.9 11/17/2013  ? CL 104 11/17/2013  ? CO2 27 11/17/2013  ? GLUCOSE 138 (H) 11/17/2013  ? BUN 12 11/17/2013  ? CREATININE 0.78 11/17/2013  ? CALCIUM 8.5 11/17/2013  ? GFRNONAA >60 11/17/2013  ? GFRAA >60 11/17/2013  ? ? ?Lab Results  ?Component Value Date  ? WBC 5.7 12/20/2021  ? NEUTROABS 3.5 12/20/2021  ? HGB 9.9 (L) 12/20/2021  ? HCT 31.0 (L) 12/20/2021  ? MCV 94.8 12/20/2021  ? PLT 253 12/20/2021  ? ?Lab Results  ?Component Value Date  ? IRON 42 12/20/2021  ? TIBC 416 12/20/2021  ? IRONPCTSAT 10 (L) 12/20/2021  ? ?Lab Results  ?Component Value Date  ? FERRITIN 32 12/20/2021  ? ? ? ?STUDIES: ?No results found. ? ?ASSESSMENT: Iron deficiency anemia. ? ?PLAN:   ? ?Iron deficiency anemia: Patient's hemoglobin remains decreased, but significantly improved to 9.9.  Her iron stores have nearly normalized only with a decreased saturation level of 10%.  Proceed with 1 additional infusion of IV Venofer today.  Patient has declined any further colonoscopies.  Return to clinic in 3 months with repeat laboratory work, further evaluation, and continuation of treatment if needed. ? ?I spent a total of 30 minutes reviewing chart data, face-to-face evaluation with the patient, counseling and coordination of care as detailed above. ? ? ?Patient expressed understanding and was in agreement with this plan. She also  understands that She can call clinic at any time with any questions, concerns, or complaints.  ? ? ? ?Lloyd Huger, MD   12/26/2021 10:14 AM ? ? ? ? ?

## 2021-12-20 ENCOUNTER — Inpatient Hospital Stay: Payer: PPO | Attending: Oncology

## 2021-12-20 DIAGNOSIS — D509 Iron deficiency anemia, unspecified: Secondary | ICD-10-CM | POA: Insufficient documentation

## 2021-12-20 LAB — CBC WITH DIFFERENTIAL/PLATELET
Abs Immature Granulocytes: 0.01 10*3/uL (ref 0.00–0.07)
Basophils Absolute: 0 10*3/uL (ref 0.0–0.1)
Basophils Relative: 1 %
Eosinophils Absolute: 0.2 10*3/uL (ref 0.0–0.5)
Eosinophils Relative: 4 %
HCT: 31 % — ABNORMAL LOW (ref 36.0–46.0)
Hemoglobin: 9.9 g/dL — ABNORMAL LOW (ref 12.0–15.0)
Immature Granulocytes: 0 %
Lymphocytes Relative: 22 %
Lymphs Abs: 1.2 10*3/uL (ref 0.7–4.0)
MCH: 30.3 pg (ref 26.0–34.0)
MCHC: 31.9 g/dL (ref 30.0–36.0)
MCV: 94.8 fL (ref 80.0–100.0)
Monocytes Absolute: 0.7 10*3/uL (ref 0.1–1.0)
Monocytes Relative: 12 %
Neutro Abs: 3.5 10*3/uL (ref 1.7–7.7)
Neutrophils Relative %: 61 %
Platelets: 253 10*3/uL (ref 150–400)
RBC: 3.27 MIL/uL — ABNORMAL LOW (ref 3.87–5.11)
RDW: 16.7 % — ABNORMAL HIGH (ref 11.5–15.5)
WBC: 5.7 10*3/uL (ref 4.0–10.5)
nRBC: 0 % (ref 0.0–0.2)

## 2021-12-20 LAB — FERRITIN: Ferritin: 32 ng/mL (ref 11–307)

## 2021-12-20 LAB — IRON AND TIBC
Iron: 42 ug/dL (ref 28–170)
Saturation Ratios: 10 % — ABNORMAL LOW (ref 10.4–31.8)
TIBC: 416 ug/dL (ref 250–450)
UIBC: 374 ug/dL

## 2021-12-22 ENCOUNTER — Inpatient Hospital Stay (HOSPITAL_BASED_OUTPATIENT_CLINIC_OR_DEPARTMENT_OTHER): Payer: PPO | Admitting: Oncology

## 2021-12-22 ENCOUNTER — Inpatient Hospital Stay: Payer: PPO

## 2021-12-22 VITALS — BP 134/83 | HR 76 | Temp 97.9°F | Resp 16 | Ht 61.0 in | Wt 154.8 lb

## 2021-12-22 DIAGNOSIS — D509 Iron deficiency anemia, unspecified: Secondary | ICD-10-CM

## 2021-12-22 MED ORDER — IRON SUCROSE 20 MG/ML IV SOLN
200.0000 mg | Freq: Once | INTRAVENOUS | Status: AC
  Administered 2021-12-22: 200 mg via INTRAVENOUS
  Filled 2021-12-22: qty 10

## 2021-12-22 MED ORDER — SODIUM CHLORIDE 0.9 % IV SOLN: 200.0000 mg | Freq: Once | INTRAVENOUS | Status: DC

## 2021-12-22 MED ORDER — SODIUM CHLORIDE 0.9 % IV SOLN
INTRAVENOUS | Status: DC
  Filled 2021-12-22: qty 250

## 2021-12-22 NOTE — Patient Instructions (Signed)
[facility]CER CTR AT [place]  Discharge Instructions: ?Thank you for choosing [facility] to provide your oncology and hematology care.  ?If you have a lab appointment with the [facility], please go directly to the [facility] and check in at the registration area. ? ?Wear comfortable clothing and clothing appropriate for easy access to any Portacath or PICC line.  ? ?We strive to give you quality time with your provider. You may need to reschedule your appointment if you arrive late (15 or more minutes).  Arriving late affects you and other patients whose appointments are after yours.  Also, if you miss three or more appointments without notifying the office, you may be dismissed from the clinic at the provider?s discretion.    ?  ?For prescription refill requests, have your pharmacy contact our office and allow 72 hours for refills to be completed.   ? ?Today you received the following : Venofer ?  ?To help prevent nausea and vomiting after your treatment, we encourage you to take your nausea medication as directed. ? ?BELOW ARE SYMPTOMS THAT SHOULD BE REPORTED IMMEDIATELY: ?*FEVER GREATER THAN 100.4 F (38 ?C) OR HIGHER ?*CHILLS OR SWEATING ?*NAUSEA AND VOMITING THAT IS NOT CONTROLLED WITH YOUR NAUSEA MEDICATION ?*UNUSUAL SHORTNESS OF BREATH ?*UNUSUAL BRUISING OR BLEEDING ?*URINARY PROBLEMS (pain or burning when urinating, or frequent urination) ?*BOWEL PROBLEMS (unusual diarrhea, constipation, pain near the anus) ?TENDERNESS IN MOUTH AND THROAT WITH OR WITHOUT PRESENCE OF ULCERS (sore throat, sores in mouth, or a toothache) ?UNUSUAL RASH, SWELLING OR PAIN  ?UNUSUAL VAGINAL DISCHARGE OR ITCHING  ? ?Items with * indicate a potential emergency and should be followed up as soon as possible or go to the Emergency Department if any problems should occur. ? ?Please show the CHEMOTHERAPY ALERT CARD or IMMUNOTHERAPY ALERT CARD at check-in to the Emergency Department and triage  nurse. ? ?Should you have questions after your visit or need to cancel or reschedule your appointment, please contact [facility]CER [place] AT [place]  [phone] and follow the prompts.  Office hours are 8:00 a.m. to 4:30 p.m. Monday - Friday. Please note that voicemails left after 4:00 p.m. may not be returned until the following business day.  We are closed weekends and major holidays. You have access to a nurse at all times for urgent questions. Please call the main number to the clinic [phone] and follow the prompts. ? ?For any non-urgent questions, you may also contact your provider using MyChart. We now offer e-Visits for anyone 55 and older to request care online for non-urgent symptoms. For details visit [email or web address]. ?  ?Also download the MyChart app! Go to the app store, search "MyChart", open the app, select [facility], and log in with your MyChart username and password. ? ?Due to Covid, a mask is required upon entering the hospital/clinic. If you do not have a mask, one will be given to you upon arrival. For doctor visits, patients may have 1 support person aged 36 or older with them. For treatment visits, patients cannot have anyone with them due to current Covid guidelines and our immunocompromised population.  ?

## 2021-12-26 ENCOUNTER — Encounter: Payer: Self-pay | Admitting: Oncology

## 2022-01-12 DIAGNOSIS — I480 Paroxysmal atrial fibrillation: Secondary | ICD-10-CM | POA: Diagnosis not present

## 2022-01-12 DIAGNOSIS — I10 Essential (primary) hypertension: Secondary | ICD-10-CM | POA: Diagnosis not present

## 2022-01-12 DIAGNOSIS — I5032 Chronic diastolic (congestive) heart failure: Secondary | ICD-10-CM | POA: Diagnosis not present

## 2022-01-12 DIAGNOSIS — I272 Pulmonary hypertension, unspecified: Secondary | ICD-10-CM | POA: Insufficient documentation

## 2022-02-09 DIAGNOSIS — L821 Other seborrheic keratosis: Secondary | ICD-10-CM | POA: Diagnosis not present

## 2022-02-09 DIAGNOSIS — L570 Actinic keratosis: Secondary | ICD-10-CM | POA: Diagnosis not present

## 2022-03-14 ENCOUNTER — Inpatient Hospital Stay: Payer: PPO | Attending: Oncology

## 2022-03-14 DIAGNOSIS — D509 Iron deficiency anemia, unspecified: Secondary | ICD-10-CM | POA: Diagnosis not present

## 2022-03-14 LAB — IRON AND TIBC
Iron: 88 ug/dL (ref 28–170)
Saturation Ratios: 21 % (ref 10.4–31.8)
TIBC: 416 ug/dL (ref 250–450)
UIBC: 328 ug/dL

## 2022-03-14 LAB — CBC WITH DIFFERENTIAL/PLATELET
Abs Immature Granulocytes: 0.01 10*3/uL (ref 0.00–0.07)
Basophils Absolute: 0 10*3/uL (ref 0.0–0.1)
Basophils Relative: 1 %
Eosinophils Absolute: 0.2 10*3/uL (ref 0.0–0.5)
Eosinophils Relative: 4 %
HCT: 30.2 % — ABNORMAL LOW (ref 36.0–46.0)
Hemoglobin: 10.1 g/dL — ABNORMAL LOW (ref 12.0–15.0)
Immature Granulocytes: 0 %
Lymphocytes Relative: 26 %
Lymphs Abs: 1.2 10*3/uL (ref 0.7–4.0)
MCH: 31.5 pg (ref 26.0–34.0)
MCHC: 33.4 g/dL (ref 30.0–36.0)
MCV: 94.1 fL (ref 80.0–100.0)
Monocytes Absolute: 0.7 10*3/uL (ref 0.1–1.0)
Monocytes Relative: 14 %
Neutro Abs: 2.6 10*3/uL (ref 1.7–7.7)
Neutrophils Relative %: 55 %
Platelets: 208 10*3/uL (ref 150–400)
RBC: 3.21 MIL/uL — ABNORMAL LOW (ref 3.87–5.11)
RDW: 14.8 % (ref 11.5–15.5)
WBC: 4.7 10*3/uL (ref 4.0–10.5)
nRBC: 0 % (ref 0.0–0.2)

## 2022-03-14 LAB — FERRITIN: Ferritin: 22 ng/mL (ref 11–307)

## 2022-03-16 ENCOUNTER — Inpatient Hospital Stay: Payer: PPO | Admitting: Medical Oncology

## 2022-03-16 ENCOUNTER — Inpatient Hospital Stay: Payer: PPO

## 2022-03-16 VITALS — BP 115/64 | HR 65 | Temp 97.4°F | Resp 16

## 2022-03-16 DIAGNOSIS — K219 Gastro-esophageal reflux disease without esophagitis: Secondary | ICD-10-CM | POA: Insufficient documentation

## 2022-03-16 DIAGNOSIS — D509 Iron deficiency anemia, unspecified: Secondary | ICD-10-CM | POA: Diagnosis not present

## 2022-03-16 DIAGNOSIS — I10 Essential (primary) hypertension: Secondary | ICD-10-CM | POA: Insufficient documentation

## 2022-03-16 MED ORDER — SODIUM CHLORIDE 0.9 % IV SOLN: 200.0000 mg | Freq: Once | INTRAVENOUS | Status: DC

## 2022-03-16 MED ORDER — SODIUM CHLORIDE 0.9 % IV SOLN
Freq: Once | INTRAVENOUS | Status: AC
  Filled 2022-03-16: qty 250

## 2022-03-16 MED ORDER — IRON SUCROSE 20 MG/ML IV SOLN
200.0000 mg | Freq: Once | INTRAVENOUS | Status: AC
  Administered 2022-03-16: 200 mg via INTRAVENOUS
  Filled 2022-03-16: qty 10

## 2022-03-16 NOTE — Patient Instructions (Signed)

## 2022-03-16 NOTE — Progress Notes (Signed)
Pt returns for follow-up for iron deficiency anemia. Denies any new concerns.

## 2022-03-23 DIAGNOSIS — I480 Paroxysmal atrial fibrillation: Secondary | ICD-10-CM | POA: Diagnosis not present

## 2022-03-23 DIAGNOSIS — I5032 Chronic diastolic (congestive) heart failure: Secondary | ICD-10-CM | POA: Diagnosis not present

## 2022-03-23 DIAGNOSIS — R600 Localized edema: Secondary | ICD-10-CM | POA: Diagnosis not present

## 2022-03-23 DIAGNOSIS — I10 Essential (primary) hypertension: Secondary | ICD-10-CM | POA: Diagnosis not present

## 2022-03-24 ENCOUNTER — Ambulatory Visit: Payer: PPO

## 2022-03-30 ENCOUNTER — Inpatient Hospital Stay: Payer: PPO | Attending: Oncology

## 2022-03-30 VITALS — BP 124/66 | HR 65 | Temp 97.7°F | Resp 18

## 2022-03-30 DIAGNOSIS — D509 Iron deficiency anemia, unspecified: Secondary | ICD-10-CM | POA: Insufficient documentation

## 2022-03-30 MED ORDER — SODIUM CHLORIDE 0.9 % IV SOLN
200.0000 mg | Freq: Once | INTRAVENOUS | Status: AC
  Administered 2022-03-30: 200 mg via INTRAVENOUS
  Filled 2022-03-30: qty 10

## 2022-03-30 MED ORDER — SODIUM CHLORIDE 0.9 % IV SOLN
INTRAVENOUS | Status: DC
  Filled 2022-03-30: qty 250

## 2022-06-01 ENCOUNTER — Encounter: Payer: Self-pay | Admitting: Oncology

## 2022-06-01 NOTE — Progress Notes (Signed)
Erroneous encounter

## 2022-06-09 NOTE — Progress Notes (Unsigned)
[place]  [phone] [phone]) [phone]  ID: Joan Ingram OB: 03-28-1930  MR#: [id number]  KGY#:[id number]  Patient Care Team: Gladstone Lighter, MD as PCP - General (Internal Medicine) Ottie Glazier, MD as Consulting Physician (Pulmonary Disease) Ubaldo Glassing Javier Docker, MD as Consulting Physician (Cardiology) Marval Regal, NP as Nurse Practitioner (Gastroenterology) Lloyd Huger, MD as Consulting Physician (Hematology and Oncology)  CHIEF COMPLAINT: Iron deficiency anemia.  INTERVAL HISTORY: Patient returns to clinic today for repeat laboratory work and further evaluation.  She continues to feel well and remains asymptomatic.  She denies any weakness or fatigue.  She has no neurologic complaints.  She denies any recent fevers or illnesses.  She has a good appetite and denies weight loss.  She has no chest pain, shortness of breath, cough, or hemoptysis.  She denies any nausea, vomiting, constipation, or diarrhea.  She has no melena or hematochezia.  She has no urinary complaints.  Patient offers no specific complaints today.  REVIEW OF SYSTEMS:   Review of Systems  Constitutional: Negative.  Negative for fever, malaise/fatigue and weight loss.  Respiratory: Negative.  Negative for cough, hemoptysis and shortness of breath.   Cardiovascular: Negative.  Negative for chest pain and leg swelling.  Gastrointestinal: Negative.  Negative for abdominal pain, blood in stool and melena.  Genitourinary: Negative.  Negative for hematuria.  Musculoskeletal: Negative.  Negative for back pain.  Skin: Negative.   Neurological: Negative.  Negative for dizziness, seizures, weakness and headaches.  Psychiatric/Behavioral: Negative.  The patient is not nervous/anxious.     As per HPI. Otherwise, a complete review of systems is negative.  PAST MEDICAL HISTORY: Past Medical History:  Diagnosis Date   Asthma    Esophageal reflux    Essential hypertension     IDA (iron deficiency anemia)    Paroxysmal atrial fibrillation (HCC)    Senile osteoporosis     PAST SURGICAL HISTORY: Past Surgical History:  Procedure Laterality Date   CARDIOVERSION N/A 04/21/2020   Procedure: CARDIOVERSION;  Surgeon: Teodoro Spray, MD;  Location: [facility] ORS;  Service: Cardiovascular;  Laterality: N/A;   colonoscopy      FAMILY HISTORY: No family history on file.  ADVANCED DIRECTIVES (Y/N):  N  HEALTH MAINTENANCE: Social History   Tobacco Use   Smoking status: Never   Smokeless tobacco: Never  Substance Use Topics   Alcohol use: No     Colonoscopy:  PAP:  Bone density:  Lipid panel:  Allergies  Allergen Reactions   Other Anaphylaxis   Aspirin    Astemizole Other (See Comments)   Benadryl [Diphenhydramine Hcl]    Ciprofloxacin    Codeine    Doxycycline    Fexofenadine Other (See Comments)   Lansoprazole Diarrhea   Levofloxacin Swelling   Loratadine Other (See Comments)   Macrobid [Nitrofurantoin Macrocrystal]    Morphine And Related    Omeprazole Diarrhea   Oxycodone    Pantoprazole Diarrhea   Penicillins    Rabeprazole Diarrhea   Terfenadine Swelling    Current Outpatient Medications  Medication Sig Dispense Refill   ADVAIR HFA 115-21 MCG/ACT inhaler Inhale 2 puffs into the lungs 2 (two) times daily.     albuterol (VENTOLIN HFA) 108 (90 Base) MCG/ACT inhaler Inhale 2 puffs into the lungs every 6 (six) hours as needed for wheezing or shortness of breath. (Patient not taking: Reported on 09/22/2021)     diltiazem (CARDIZEM CD) 120 MG 24 hr capsule Take 120 mg by mouth daily. (  Patient not taking: Reported on 09/20/2021)     furosemide (LASIX) 40 MG tablet Take 40 mg by mouth.     rivaroxaban (XARELTO) 20 MG TABS tablet Take 20 mg by mouth daily with supper.     No current facility-administered medications for this visit.    OBJECTIVE: There were no vitals filed for this visit.    There is no height or weight on file to calculate  BMI.    ECOG FS:0 - Asymptomatic  General: Well-developed, well-nourished, no acute distress. Eyes: Pink conjunctiva, anicteric sclera. HEENT: Normocephalic, moist mucous membranes. Lungs: No audible wheezing or coughing. Heart: Regular rate and rhythm. Abdomen: Soft, nontender, no obvious distention. Musculoskeletal: No edema, cyanosis, or clubbing. Neuro: Alert, answering all questions appropriately. Cranial nerves grossly intact. Skin: No rashes or petechiae noted. Psych: Normal affect.   LAB RESULTS:  Lab Results  Component Value Date   NA 137 11/17/2013   K 3.9 11/17/2013   CL 104 11/17/2013   CO2 27 11/17/2013   GLUCOSE 138 (H) 11/17/2013   BUN 12 11/17/2013   CREATININE 0.78 11/17/2013   CALCIUM 8.5 11/17/2013   GFRNONAA >60 11/17/2013   GFRAA >60 11/17/2013    Lab Results  Component Value Date   WBC 4.7 03/14/2022   NEUTROABS 2.6 03/14/2022   HGB 10.1 (L) 03/14/2022   HCT 30.2 (L) 03/14/2022   MCV 94.1 03/14/2022   PLT 208 03/14/2022   Lab Results  Component Value Date   IRON 88 03/14/2022   TIBC 416 03/14/2022   IRONPCTSAT 21 03/14/2022   Lab Results  Component Value Date   FERRITIN 22 03/14/2022     STUDIES: No results found.  ASSESSMENT: Iron deficiency anemia.  PLAN:    Iron deficiency anemia: Patient's hemoglobin remains decreased, but significantly improved to 9.9.  Her iron stores have nearly normalized only with a decreased saturation level of 10%.  Proceed with 1 additional infusion of IV Venofer today.  Patient has declined any further colonoscopies.  Return to clinic in 3 months with repeat laboratory work, further evaluation, and continuation of treatment if needed.  I spent a total of 30 minutes reviewing chart data, face-to-face evaluation with the patient, counseling and coordination of care as detailed above.   Patient expressed understanding and was in agreement with this plan. She also understands that She can call clinic at any  time with any questions, concerns, or complaints.     Lloyd Huger, MD   06/09/2022 9:05 AM

## 2022-06-12 ENCOUNTER — Other Ambulatory Visit: Payer: Self-pay

## 2022-06-12 DIAGNOSIS — D509 Iron deficiency anemia, unspecified: Secondary | ICD-10-CM

## 2022-06-13 ENCOUNTER — Inpatient Hospital Stay: Payer: PPO | Attending: Oncology

## 2022-06-13 DIAGNOSIS — I10 Essential (primary) hypertension: Secondary | ICD-10-CM | POA: Insufficient documentation

## 2022-06-13 DIAGNOSIS — I480 Paroxysmal atrial fibrillation: Secondary | ICD-10-CM | POA: Insufficient documentation

## 2022-06-13 DIAGNOSIS — D509 Iron deficiency anemia, unspecified: Secondary | ICD-10-CM | POA: Insufficient documentation

## 2022-06-13 LAB — CBC WITH DIFFERENTIAL/PLATELET
Abs Immature Granulocytes: 0.01 10*3/uL (ref 0.00–0.07)
Basophils Absolute: 0 10*3/uL (ref 0.0–0.1)
Basophils Relative: 1 %
Eosinophils Absolute: 0.2 10*3/uL (ref 0.0–0.5)
Eosinophils Relative: 5 %
HCT: 31.2 % — ABNORMAL LOW (ref 36.0–46.0)
Hemoglobin: 10.2 g/dL — ABNORMAL LOW (ref 12.0–15.0)
Immature Granulocytes: 0 %
Lymphocytes Relative: 27 %
Lymphs Abs: 1.4 10*3/uL (ref 0.7–4.0)
MCH: 31.8 pg (ref 26.0–34.0)
MCHC: 32.7 g/dL (ref 30.0–36.0)
MCV: 97.2 fL (ref 80.0–100.0)
Monocytes Absolute: 0.6 10*3/uL (ref 0.1–1.0)
Monocytes Relative: 12 %
Neutro Abs: 2.8 10*3/uL (ref 1.7–7.7)
Neutrophils Relative %: 55 %
Platelets: 225 10*3/uL (ref 150–400)
RBC: 3.21 MIL/uL — ABNORMAL LOW (ref 3.87–5.11)
RDW: 14.7 % (ref 11.5–15.5)
WBC: 5.1 10*3/uL (ref 4.0–10.5)
nRBC: 0 % (ref 0.0–0.2)

## 2022-06-13 LAB — FERRITIN: Ferritin: 27 ng/mL (ref 11–307)

## 2022-06-13 LAB — IRON AND TIBC
Iron: 58 ug/dL (ref 28–170)
Saturation Ratios: 15 % (ref 10.4–31.8)
TIBC: 379 ug/dL (ref 250–450)
UIBC: 321 ug/dL

## 2022-06-14 ENCOUNTER — Other Ambulatory Visit: Payer: PPO

## 2022-06-15 ENCOUNTER — Inpatient Hospital Stay: Payer: PPO

## 2022-06-15 ENCOUNTER — Ambulatory Visit: Payer: PPO | Admitting: Oncology

## 2022-06-15 ENCOUNTER — Encounter: Payer: Self-pay | Admitting: Oncology

## 2022-06-15 ENCOUNTER — Inpatient Hospital Stay (HOSPITAL_BASED_OUTPATIENT_CLINIC_OR_DEPARTMENT_OTHER): Payer: PPO | Admitting: Nurse Practitioner

## 2022-06-15 ENCOUNTER — Ambulatory Visit: Payer: PPO

## 2022-06-15 VITALS — BP 137/90 | HR 88 | Temp 97.0°F | Wt 155.0 lb

## 2022-06-15 DIAGNOSIS — D509 Iron deficiency anemia, unspecified: Secondary | ICD-10-CM

## 2022-06-15 MED ORDER — SODIUM CHLORIDE 0.9 % IV SOLN
200.0000 mg | Freq: Once | INTRAVENOUS | Status: AC
  Administered 2022-06-15: 200 mg via INTRAVENOUS
  Filled 2022-06-15: qty 200

## 2022-06-15 MED ORDER — SODIUM CHLORIDE 0.9 % IV SOLN
INTRAVENOUS | Status: DC
  Filled 2022-06-15: qty 250

## 2022-06-15 NOTE — Patient Instructions (Signed)
[facility]  Discharge Instructions: Thank you for choosing [facility] to provide your oncology and hematology care.  If you have a lab appointment with the Cancer Center, please go directly to the Cancer Center and check in at the registration area.  Wear comfortable clothing and clothing appropriate for easy access to any Portacath or PICC line.   We strive to give you quality time with your provider. You may need to reschedule your appointment if you arrive late (15 or more minutes).  Arriving late affects you and other patients whose appointments are after yours.  Also, if you miss three or more appointments without notifying the office, you may be dismissed from the clinic at the provider's discretion.      For prescription refill requests, have your pharmacy contact our office and allow 72 hours for refills to be completed.    Today you received the following chemotherapy and/or immunotherapy agents Venofer.      To help prevent nausea and vomiting after your treatment, we encourage you to take your nausea medication as directed.  BELOW ARE SYMPTOMS THAT SHOULD BE REPORTED IMMEDIATELY: *FEVER GREATER THAN 100.4 F (38 C) OR HIGHER *CHILLS OR SWEATING *NAUSEA AND VOMITING THAT IS NOT CONTROLLED WITH YOUR NAUSEA MEDICATION *UNUSUAL SHORTNESS OF BREATH *UNUSUAL BRUISING OR BLEEDING *URINARY PROBLEMS (pain or burning when urinating, or frequent urination) *BOWEL PROBLEMS (unusual diarrhea, constipation, pain near the anus) TENDERNESS IN MOUTH AND THROAT WITH OR WITHOUT PRESENCE OF ULCERS (sore throat, sores in mouth, or a toothache) UNUSUAL RASH, SWELLING OR PAIN  UNUSUAL VAGINAL DISCHARGE OR ITCHING   Items with * indicate a potential emergency and should be followed up as soon as possible or go to the Emergency Department if any problems should occur.  Please show the CHEMOTHERAPY ALERT CARD or IMMUNOTHERAPY ALERT CARD at check-in to  the Emergency Department and triage nurse.  Should you have questions after your visit or need to cancel or reschedule your appointment, please contact [facility]  [phone] and follow the prompts.  Office hours are 8:00 a.m. to 4:30 p.m. Monday - Friday. Please note that voicemails left after 4:00 p.m. may not be returned until the following business day.  We are closed weekends and major holidays. You have access to a nurse at all times for urgent questions. Please call the main number to the clinic [phone] and follow the prompts.  For any non-urgent questions, you may also contact your provider using MyChart. We now offer e-Visits for anyone 18 and older to request care online for non-urgent symptoms. For details visit [email or web address].   Also download the MyChart app! Go to the app store, search "MyChart", open the app, select [facility], and log in with your MyChart username and password.  Masks are optional in the cancer centers. If you would like for your care team to wear a mask while they are taking care of you, please let them know. For doctor visits, patients may have with them one support person who is at least 86 years old. At this time, visitors are not allowed in the infusion area.   

## 2022-07-18 DIAGNOSIS — Z23 Encounter for immunization: Secondary | ICD-10-CM | POA: Diagnosis not present

## 2022-07-18 DIAGNOSIS — I10 Essential (primary) hypertension: Secondary | ICD-10-CM | POA: Diagnosis not present

## 2022-07-18 DIAGNOSIS — I5032 Chronic diastolic (congestive) heart failure: Secondary | ICD-10-CM | POA: Diagnosis not present

## 2022-07-18 DIAGNOSIS — I4819 Other persistent atrial fibrillation: Secondary | ICD-10-CM | POA: Diagnosis not present

## 2022-07-24 ENCOUNTER — Emergency Department
Admission: EM | Admit: 2022-07-24 | Discharge: 2022-07-24 | Disposition: A | Discharge status: Home / Self Care | Payer: PPO | Attending: Emergency Medicine | Admitting: Emergency Medicine

## 2022-07-24 ENCOUNTER — Emergency Department: Payer: PPO

## 2022-07-24 ENCOUNTER — Other Ambulatory Visit: Payer: Self-pay

## 2022-07-24 DIAGNOSIS — R000 Tachycardia, unspecified: Secondary | ICD-10-CM | POA: Diagnosis not present

## 2022-07-24 DIAGNOSIS — S2232XA Fracture of one rib, left side, initial encounter for closed fracture: Secondary | ICD-10-CM | POA: Insufficient documentation

## 2022-07-24 DIAGNOSIS — I10 Essential (primary) hypertension: Secondary | ICD-10-CM | POA: Diagnosis not present

## 2022-07-24 DIAGNOSIS — K449 Diaphragmatic hernia without obstruction or gangrene: Secondary | ICD-10-CM | POA: Diagnosis not present

## 2022-07-24 DIAGNOSIS — W1830XA Fall on same level, unspecified, initial encounter: Secondary | ICD-10-CM | POA: Insufficient documentation

## 2022-07-24 DIAGNOSIS — R609 Edema, unspecified: Secondary | ICD-10-CM | POA: Diagnosis not present

## 2022-07-24 DIAGNOSIS — R079 Chest pain, unspecified: Secondary | ICD-10-CM

## 2022-07-24 DIAGNOSIS — I509 Heart failure, unspecified: Secondary | ICD-10-CM | POA: Insufficient documentation

## 2022-07-24 DIAGNOSIS — S299XXA Unspecified injury of thorax, initial encounter: Secondary | ICD-10-CM | POA: Diagnosis present

## 2022-07-24 DIAGNOSIS — R0789 Other chest pain: Secondary | ICD-10-CM | POA: Diagnosis not present

## 2022-07-24 LAB — COMPREHENSIVE METABOLIC PANEL
ALT: 15 U/L (ref 0–44)
AST: 28 U/L (ref 15–41)
Albumin: 3.4 g/dL — ABNORMAL LOW (ref 3.5–5.0)
Alkaline Phosphatase: 41 U/L (ref 38–126)
Anion gap: 4 — ABNORMAL LOW (ref 5–15)
BUN: 17 mg/dL (ref 8–23)
CO2: 22 mmol/L (ref 22–32)
Calcium: 7.9 mg/dL — ABNORMAL LOW (ref 8.9–10.3)
Chloride: 111 mmol/L (ref 98–111)
Creatinine, Ser: 0.72 mg/dL (ref 0.44–1.00)
GFR, Estimated: >60 mL/min (ref >60)
Glucose, Bld: 96 mg/dL (ref 70–99)
Potassium: 3.4 mmol/L — ABNORMAL LOW (ref 3.5–5.1)
Sodium: 137 mmol/L (ref 135–145)
Total Bilirubin: 1 mg/dL (ref 0.3–1.2)
Total Protein: 6.1 g/dL — ABNORMAL LOW (ref 6.5–8.1)

## 2022-07-24 LAB — CBC WITH DIFFERENTIAL/PLATELET
Abs Immature Granulocytes: 0.02 10*3/uL (ref 0.00–0.07)
Basophils Absolute: 0 10*3/uL (ref 0.0–0.1)
Basophils Relative: 1 %
Eosinophils Absolute: 0.3 10*3/uL (ref 0.0–0.5)
Eosinophils Relative: 5 %
HCT: 28.3 % — ABNORMAL LOW (ref 36.0–46.0)
Hemoglobin: 9.3 g/dL — ABNORMAL LOW (ref 12.0–15.0)
Immature Granulocytes: 0 %
Lymphocytes Relative: 29 %
Lymphs Abs: 1.4 10*3/uL (ref 0.7–4.0)
MCH: 31.3 pg (ref 26.0–34.0)
MCHC: 32.9 g/dL (ref 30.0–36.0)
MCV: 95.3 fL (ref 80.0–100.0)
Monocytes Absolute: 0.6 10*3/uL (ref 0.1–1.0)
Monocytes Relative: 12 %
Neutro Abs: 2.6 10*3/uL (ref 1.7–7.7)
Neutrophils Relative %: 53 %
Platelets: 212 10*3/uL (ref 150–400)
RBC: 2.97 MIL/uL — ABNORMAL LOW (ref 3.87–5.11)
RDW: 14.6 % (ref 11.5–15.5)
WBC: 5 10*3/uL (ref 4.0–10.5)
nRBC: 0 % (ref 0.0–0.2)

## 2022-07-24 LAB — TROPONIN I (HIGH SENSITIVITY)
Troponin I (High Sensitivity): 18 ng/L — ABNORMAL HIGH (ref <18)
Troponin I (High Sensitivity): 24 ng/L — ABNORMAL HIGH (ref <18)

## 2022-07-24 LAB — BRAIN NATRIURETIC PEPTIDE: B Natriuretic Peptide: 176.4 pg/mL — ABNORMAL HIGH (ref 0.0–100.0)

## 2022-07-24 MED ORDER — ACETAMINOPHEN 500 MG PO TABS
1000.0000 mg | ORAL_TABLET | Freq: Once | ORAL | Status: AC
  Administered 2022-07-24: 1000 mg via ORAL
  Filled 2022-07-24: qty 2

## 2022-07-24 MED ORDER — HYDROCODONE-ACETAMINOPHEN 5-325 MG PO TABS: 1.0000 | ORAL_TABLET | ORAL | 0 refills | Status: AC | PRN

## 2022-07-24 MED ORDER — OXYCODONE-ACETAMINOPHEN 5-325 MG PO TABS: 1.0000 | ORAL_TABLET | ORAL | 0 refills | Status: DC | PRN

## 2022-07-24 MED ORDER — IOHEXOL 350 MG/ML SOLN
75.0000 mL | Freq: Once | INTRAVENOUS | Status: AC | PRN
  Administered 2022-07-24: 75 mL via INTRAVENOUS

## 2022-07-24 MED ORDER — ONDANSETRON 4 MG PO TBDP: 4.0000 mg | ORAL_TABLET | Freq: Three times a day (TID) | ORAL | 0 refills | Status: DC | PRN

## 2022-07-24 NOTE — ED Provider Notes (Signed)
[facility] Provider Note   Event Date/Time   First MD Initiated Contact with Patient 07/24/22 0515     (approximate) History  Chest Pain  HPI Joan Ingram is a 88 y.o. female with a stated past medical history of atrial fibrillation and CHF who presents via EMS after being awoken by left anterior chest wall pain.  Patient describes his pain is 6/10, nonradiating, and had been intermittent over the last 4 days after a fall from standing.  Patient states that she has been having intermittent pain over the left anterior chest that she attributed to a broken rib however never sought medical care.  Patient states that when she woke this morning the pain was constant.  Patient has not tried taking any medications for this pain.  Patient also complains of bilateral lower extremity edema that she states is normal for her however they are slightly increased from baseline. ROS: Patient currently denies any vision changes, tinnitus, difficulty speaking, facial droop, sore throat, shortness of breath, abdominal pain, nausea/vomiting/diarrhea, dysuria, or weakness/numbness/paresthesias in any extremity   Physical Exam  Triage Vital Signs: ED Triage Vitals  Enc Vitals Group     BP 07/24/22 0514 (!) 145/86     Pulse Rate 07/24/22 0514 (!) 118     Resp 07/24/22 0514 (!) 28     Temp 07/24/22 0514 97.8 F (36.6 C)     Temp Source 07/24/22 0514 Oral     SpO2 07/24/22 0514 99 %     Weight --      Height --      Head Circumference --      Peak Flow --      Pain Score 07/24/22 0515 6     Pain Loc --      Pain Edu? --      Excl. in [place]? --    Most recent vital signs: Vitals:   07/24/22 1245 07/24/22 1300  BP:  128/86  Pulse: 84 (!) 102  Resp:  18  Temp:    SpO2: 98% 99%   General: Awake, oriented x4. CV:  Good peripheral perfusion.  Resp:  Normal effort.  Abd:  No distention.  Other:  Elderly overweight Caucasian female laying in bed in no acute distress.  There  is 2+ pitting edema to bilateral lower extremities to the thigh. ED Results / Procedures / Treatments  Labs (all labs ordered are listed, but only abnormal results are displayed) Labs Reviewed  BRAIN NATRIURETIC PEPTIDE - Abnormal; Notable for the following components:      Result Value   B Natriuretic Peptide 176.4 (*)    All other components within normal limits  COMPREHENSIVE METABOLIC PANEL - Abnormal; Notable for the following components:   Potassium 3.4 (*)    Calcium 7.9 (*)    Total Protein 6.1 (*)    Albumin 3.4 (*)    Anion gap 4 (*)    All other components within normal limits  CBC WITH DIFFERENTIAL/PLATELET - Abnormal; Notable for the following components:   RBC 2.97 (*)    Hemoglobin 9.3 (*)    HCT 28.3 (*)    All other components within normal limits  TROPONIN I (HIGH SENSITIVITY) - Abnormal; Notable for the following components:   Troponin I (High Sensitivity) 18 (*)    All other components within normal limits  TROPONIN I (HIGH SENSITIVITY) - Abnormal; Notable for the following components:   Troponin I (High Sensitivity) 24 (*)  All other components within normal limits   PROCEDURES: Critical Care performed: No Procedures MEDICATIONS ORDERED IN ED: Medications  iohexol (OMNIPAQUE) 350 MG/ML injection 75 mL (75 mLs Intravenous Contrast Given 07/24/22 0700)  acetaminophen (TYLENOL) tablet 1,000 mg (1,000 mg Oral Given 07/24/22 1009)   IMPRESSION / MDM / ASSESSMENT AND PLAN / ED COURSE  I reviewed the triage vital signs and the nursing notes.                             The patient is on the cardiac monitor to evaluate for evidence of arrhythmia and/or significant heart rate changes. Patient's presentation is most consistent with acute presentation with potential threat to life or bodily function. Workup: ECG, CXR, CBC, BMP, Troponin Findings: ECG: No overt evidence of STEMI. No evidence of Brugadas sign, delta wave, epsilon wave, significantly prolonged QTc,  or malignant arrhythmia HS Troponin: Negative x1 Other Labs unremarkable for emergent problems. CXR: Without PTX, PNA, or widened mediastinum Last Stress Test:  2017 Last Heart Catheterization:  2018 HEART Score: 4  Given History, Exam, and Workup I have low suspicion for ACS, Pneumothorax, Pneumonia, Pulmonary Embolus, Tamponade, Aortic Dissection or other emergent problem as a cause for this presentation.   Care of this patient will be signed out to the oncoming physician at the end of my shift.  All pertinent patient information conveyed and all questions answered.  All further care and disposition decisions will be made by the oncoming physician.    FINAL CLINICAL IMPRESSION(S) / ED DIAGNOSES   Final diagnoses:  Chest pain, unspecified type  Hiatal hernia  Closed fracture of one rib of left side, initial encounter   Rx / DC Orders   ED Discharge Orders          Ordered    oxyCODONE-acetaminophen (PERCOCET) 5-325 MG tablet  Every 4 hours PRN,   Status:  Discontinued        07/24/22 1034    ondansetron (ZOFRAN-ODT) 4 MG disintegrating tablet  Every 8 hours PRN        07/24/22 1034    HYDROcodone-acetaminophen (NORCO/VICODIN) 5-325 MG tablet  Every 4 hours PRN        07/24/22 1121           Note:  This document was prepared using Dragon voice recognition software and may include unintentional dictation errors.   Naaman Plummer, MD 07/25/22 [phone]

## 2022-07-24 NOTE — Discharge Instructions (Addendum)
Pain control:  Ibuprofen (motrin/aleve/advil) - You can take 3-4 tablets (600-800 mg) every 6 hours as needed for pain/fever.  Acetaminophen (tylenol) - You can take 2 extra strength tablets (1000 mg) every 6 hours as needed for pain/fever.  You can alternate these medications or take them together.  Make sure you eat food/drink water when taking these medications.  You were given a prescription for narcotic pain medications.  Take only if in severe pain.  These are very addictive medications.  These medications can make you constipated.  If you need to take more than 1-2 doses, start a stool softner.  If you become constipated, take 1 capfull of MiraLAX, can repeat untill having regular bowel movements.  Keep this medication out of reach of any children.

## 2022-07-24 NOTE — ED Notes (Signed)
Pt provided with water and coffee per request

## 2022-07-24 NOTE — ED Notes (Signed)
Pt ambulatory to bathroom and back to bed with use of walker. Pt did not require assistance from RN and did not have difficulty.

## 2022-07-24 NOTE — ED Notes (Signed)
Pt returned from radiology.

## 2022-07-24 NOTE — ED Provider Notes (Signed)
CT scan finding 1 rib fracture.  Patient did have a fall.  Second troponin came back was not significantly elevated when compared to the first.  Most likely pain secondary to the 1 rib fracture.  Given Tylenol in the emergency department.  We will do a short course of narcotic pain medication.  Given an incentive spirometer in the emergency department and instructed on how to use.  Discussed the risk of pneumonia and given return precautions.  Discussed incidental findings found on CT scan.   Nathaniel Man, MD 07/24/22 1032

## 2022-07-24 NOTE — ED Triage Notes (Signed)
Pt was awakened with midsternal CP. Pt also reports that she has increased bilateral lower ext edema.

## 2022-08-24 DIAGNOSIS — R0602 Shortness of breath: Secondary | ICD-10-CM | POA: Diagnosis not present

## 2022-09-14 ENCOUNTER — Inpatient Hospital Stay: Payer: PPO

## 2022-09-21 ENCOUNTER — Ambulatory Visit: Payer: PPO

## 2022-09-21 ENCOUNTER — Ambulatory Visit: Payer: PPO | Admitting: Oncology

## 2022-10-17 DIAGNOSIS — R739 Hyperglycemia, unspecified: Secondary | ICD-10-CM | POA: Diagnosis not present

## 2022-10-17 DIAGNOSIS — I5032 Chronic diastolic (congestive) heart failure: Secondary | ICD-10-CM | POA: Diagnosis not present

## 2022-10-17 DIAGNOSIS — I480 Paroxysmal atrial fibrillation: Secondary | ICD-10-CM | POA: Diagnosis not present

## 2022-10-17 DIAGNOSIS — I10 Essential (primary) hypertension: Secondary | ICD-10-CM | POA: Diagnosis not present

## 2022-10-17 DIAGNOSIS — Z780 Asymptomatic menopausal state: Secondary | ICD-10-CM | POA: Diagnosis not present

## 2022-10-17 DIAGNOSIS — D508 Other iron deficiency anemias: Secondary | ICD-10-CM | POA: Diagnosis not present

## 2022-11-14 DIAGNOSIS — I272 Pulmonary hypertension, unspecified: Secondary | ICD-10-CM | POA: Diagnosis not present

## 2022-11-14 DIAGNOSIS — I5032 Chronic diastolic (congestive) heart failure: Secondary | ICD-10-CM | POA: Diagnosis not present

## 2022-11-14 DIAGNOSIS — I10 Essential (primary) hypertension: Secondary | ICD-10-CM | POA: Diagnosis not present

## 2022-11-14 DIAGNOSIS — I4819 Other persistent atrial fibrillation: Secondary | ICD-10-CM | POA: Diagnosis not present

## 2022-12-12 DIAGNOSIS — H26491 Other secondary cataract, right eye: Secondary | ICD-10-CM | POA: Diagnosis not present

## 2022-12-12 DIAGNOSIS — Z961 Presence of intraocular lens: Secondary | ICD-10-CM | POA: Diagnosis not present

## 2023-01-03 ENCOUNTER — Inpatient Hospital Stay
Admission: EM | Admit: 2023-01-03 | Discharge: 2023-01-10 | DRG: 813 | Disposition: A | Discharge status: Skilled Nursing Facility | Payer: PPO | Attending: Internal Medicine | Admitting: Internal Medicine

## 2023-01-03 ENCOUNTER — Other Ambulatory Visit: Payer: Self-pay

## 2023-01-03 ENCOUNTER — Emergency Department: Payer: PPO

## 2023-01-03 DIAGNOSIS — I5032 Chronic diastolic (congestive) heart failure: Secondary | ICD-10-CM | POA: Diagnosis present

## 2023-01-03 DIAGNOSIS — N390 Urinary tract infection, site not specified: Secondary | ICD-10-CM | POA: Diagnosis not present

## 2023-01-03 DIAGNOSIS — R079 Chest pain, unspecified: Secondary | ICD-10-CM | POA: Diagnosis not present

## 2023-01-03 DIAGNOSIS — R000 Tachycardia, unspecified: Secondary | ICD-10-CM | POA: Diagnosis not present

## 2023-01-03 DIAGNOSIS — E860 Dehydration: Secondary | ICD-10-CM | POA: Diagnosis not present

## 2023-01-03 DIAGNOSIS — R0602 Shortness of breath: Secondary | ICD-10-CM | POA: Diagnosis not present

## 2023-01-03 DIAGNOSIS — I890 Lymphedema, not elsewhere classified: Secondary | ICD-10-CM | POA: Diagnosis not present

## 2023-01-03 DIAGNOSIS — D509 Iron deficiency anemia, unspecified: Secondary | ICD-10-CM | POA: Diagnosis not present

## 2023-01-03 DIAGNOSIS — I110 Hypertensive heart disease with heart failure: Secondary | ICD-10-CM | POA: Diagnosis not present

## 2023-01-03 DIAGNOSIS — J45909 Unspecified asthma, uncomplicated: Secondary | ICD-10-CM | POA: Diagnosis not present

## 2023-01-03 DIAGNOSIS — Z1152 Encounter for screening for COVID-19: Secondary | ICD-10-CM

## 2023-01-03 DIAGNOSIS — D6832 Hemorrhagic disorder due to extrinsic circulating anticoagulants: Secondary | ICD-10-CM | POA: Diagnosis not present

## 2023-01-03 DIAGNOSIS — Z7189 Other specified counseling: Secondary | ICD-10-CM | POA: Diagnosis not present

## 2023-01-03 DIAGNOSIS — I10 Essential (primary) hypertension: Secondary | ICD-10-CM | POA: Diagnosis not present

## 2023-01-03 DIAGNOSIS — R110 Nausea: Secondary | ICD-10-CM | POA: Diagnosis not present

## 2023-01-03 DIAGNOSIS — Z885 Allergy status to narcotic agent status: Secondary | ICD-10-CM

## 2023-01-03 DIAGNOSIS — D649 Anemia, unspecified: Secondary | ICD-10-CM | POA: Diagnosis present

## 2023-01-03 DIAGNOSIS — Z66 Do not resuscitate: Secondary | ICD-10-CM | POA: Diagnosis not present

## 2023-01-03 DIAGNOSIS — E871 Hypo-osmolality and hyponatremia: Secondary | ICD-10-CM | POA: Diagnosis present

## 2023-01-03 DIAGNOSIS — R2681 Unsteadiness on feet: Secondary | ICD-10-CM | POA: Diagnosis not present

## 2023-01-03 DIAGNOSIS — D62 Acute posthemorrhagic anemia: Secondary | ICD-10-CM | POA: Diagnosis not present

## 2023-01-03 DIAGNOSIS — I5033 Acute on chronic diastolic (congestive) heart failure: Secondary | ICD-10-CM | POA: Diagnosis not present

## 2023-01-03 DIAGNOSIS — K921 Melena: Secondary | ICD-10-CM | POA: Diagnosis not present

## 2023-01-03 DIAGNOSIS — J90 Pleural effusion, not elsewhere classified: Secondary | ICD-10-CM | POA: Diagnosis not present

## 2023-01-03 DIAGNOSIS — Z7901 Long term (current) use of anticoagulants: Secondary | ICD-10-CM

## 2023-01-03 DIAGNOSIS — Z888 Allergy status to other drugs, medicaments and biological substances status: Secondary | ICD-10-CM

## 2023-01-03 DIAGNOSIS — I480 Paroxysmal atrial fibrillation: Secondary | ICD-10-CM | POA: Diagnosis present

## 2023-01-03 DIAGNOSIS — Z79899 Other long term (current) drug therapy: Secondary | ICD-10-CM

## 2023-01-03 DIAGNOSIS — Z736 Limitation of activities due to disability: Secondary | ICD-10-CM | POA: Diagnosis not present

## 2023-01-03 DIAGNOSIS — Z880 Allergy status to penicillin: Secondary | ICD-10-CM

## 2023-01-03 DIAGNOSIS — J168 Pneumonia due to other specified infectious organisms: Secondary | ICD-10-CM | POA: Diagnosis not present

## 2023-01-03 DIAGNOSIS — Z881 Allergy status to other antibiotic agents status: Secondary | ICD-10-CM | POA: Diagnosis not present

## 2023-01-03 DIAGNOSIS — Z515 Encounter for palliative care: Secondary | ICD-10-CM | POA: Diagnosis not present

## 2023-01-03 DIAGNOSIS — T45515A Adverse effect of anticoagulants, initial encounter: Secondary | ICD-10-CM | POA: Diagnosis present

## 2023-01-03 DIAGNOSIS — J189 Pneumonia, unspecified organism: Secondary | ICD-10-CM | POA: Diagnosis not present

## 2023-01-03 DIAGNOSIS — M6259 Muscle wasting and atrophy, not elsewhere classified, multiple sites: Secondary | ICD-10-CM | POA: Diagnosis not present

## 2023-01-03 DIAGNOSIS — M810 Age-related osteoporosis without current pathological fracture: Secondary | ICD-10-CM | POA: Diagnosis not present

## 2023-01-03 DIAGNOSIS — K449 Diaphragmatic hernia without obstruction or gangrene: Secondary | ICD-10-CM | POA: Diagnosis not present

## 2023-01-03 DIAGNOSIS — K219 Gastro-esophageal reflux disease without esophagitis: Secondary | ICD-10-CM | POA: Diagnosis present

## 2023-01-03 DIAGNOSIS — I272 Pulmonary hypertension, unspecified: Secondary | ICD-10-CM | POA: Diagnosis not present

## 2023-01-03 DIAGNOSIS — E876 Hypokalemia: Secondary | ICD-10-CM | POA: Diagnosis not present

## 2023-01-03 DIAGNOSIS — Z886 Allergy status to analgesic agent status: Secondary | ICD-10-CM

## 2023-01-03 DIAGNOSIS — R41841 Cognitive communication deficit: Secondary | ICD-10-CM | POA: Diagnosis not present

## 2023-01-03 DIAGNOSIS — R4182 Altered mental status, unspecified: Secondary | ICD-10-CM | POA: Diagnosis not present

## 2023-01-03 DIAGNOSIS — R531 Weakness: Secondary | ICD-10-CM | POA: Diagnosis not present

## 2023-01-03 DIAGNOSIS — Z7951 Long term (current) use of inhaled steroids: Secondary | ICD-10-CM

## 2023-01-03 DIAGNOSIS — R609 Edema, unspecified: Secondary | ICD-10-CM | POA: Diagnosis not present

## 2023-01-03 LAB — CBC WITH DIFFERENTIAL/PLATELET
Abs Immature Granulocytes: 0.03 10*3/uL (ref 0.00–0.07)
Basophils Absolute: 0 10*3/uL (ref 0.0–0.1)
Basophils Relative: 0 %
Eosinophils Absolute: 0 10*3/uL (ref 0.0–0.5)
Eosinophils Relative: 0 %
HCT: 15.7 % — ABNORMAL LOW (ref 36.0–46.0)
Hemoglobin: 4.8 g/dL — CL (ref 12.0–15.0)
Immature Granulocytes: 1 %
Lymphocytes Relative: 12 %
Lymphs Abs: 0.8 10*3/uL (ref 0.7–4.0)
MCH: 24.7 pg — ABNORMAL LOW (ref 26.0–34.0)
MCHC: 30.6 g/dL (ref 30.0–36.0)
MCV: 80.9 fL (ref 80.0–100.0)
Monocytes Absolute: 0.9 10*3/uL (ref 0.1–1.0)
Monocytes Relative: 15 %
Neutro Abs: 4.5 10*3/uL (ref 1.7–7.7)
Neutrophils Relative %: 72 %
Platelets: 373 10*3/uL (ref 150–400)
RBC: 1.94 MIL/uL — ABNORMAL LOW (ref 3.87–5.11)
RDW: 16.5 % — ABNORMAL HIGH (ref 11.5–15.5)
WBC: 6.2 10*3/uL (ref 4.0–10.5)
nRBC: 0.5 % — ABNORMAL HIGH (ref 0.0–0.2)

## 2023-01-03 LAB — ABO/RH: ABO/RH(D): A POS

## 2023-01-03 LAB — BPAM RBC: Unit Type and Rh: 6200

## 2023-01-03 LAB — COMPREHENSIVE METABOLIC PANEL
ALT: 19 U/L (ref 0–44)
AST: 35 U/L (ref 15–41)
Albumin: 3.6 g/dL (ref 3.5–5.0)
Alkaline Phosphatase: 41 U/L (ref 38–126)
Anion gap: 8 (ref 5–15)
BUN: 12 mg/dL (ref 8–23)
CO2: 26 mmol/L (ref 22–32)
Calcium: 8.6 mg/dL — ABNORMAL LOW (ref 8.9–10.3)
Chloride: 92 mmol/L — ABNORMAL LOW (ref 98–111)
Creatinine, Ser: 0.92 mg/dL (ref 0.44–1.00)
GFR, Estimated: 59 mL/min — ABNORMAL LOW (ref >60)
Glucose, Bld: 136 mg/dL — ABNORMAL HIGH (ref 70–99)
Potassium: 2.9 mmol/L — ABNORMAL LOW (ref 3.5–5.1)
Sodium: 126 mmol/L — ABNORMAL LOW (ref 135–145)
Total Bilirubin: 1.1 mg/dL (ref 0.3–1.2)
Total Protein: 6.1 g/dL — ABNORMAL LOW (ref 6.5–8.1)

## 2023-01-03 LAB — MAGNESIUM: Magnesium: 1.9 mg/dL (ref 1.7–2.4)

## 2023-01-03 LAB — RESP PANEL BY RT-PCR (RSV, FLU A&B, COVID)  RVPGX2
Influenza A by PCR: NEGATIVE
Influenza B by PCR: NEGATIVE
Resp Syncytial Virus by PCR: NEGATIVE
SARS Coronavirus 2 by RT PCR: NEGATIVE

## 2023-01-03 LAB — PREPARE RBC (CROSSMATCH)

## 2023-01-03 MED ORDER — SODIUM CHLORIDE 0.9 % IV SOLN: 500.0000 mg | Freq: Once | INTRAVENOUS | Status: DC

## 2023-01-03 MED ORDER — SODIUM CHLORIDE 0.9 % IV SOLN
10.0000 mL/h | Freq: Once | INTRAVENOUS | Status: AC
  Administered 2023-01-03: 10 mL/h via INTRAVENOUS

## 2023-01-03 MED ORDER — FAMOTIDINE IN NACL 20-0.9 MG/50ML-% IV SOLN
20.0000 mg | Freq: Once | INTRAVENOUS | Status: AC
  Administered 2023-01-03: 20 mg via INTRAVENOUS
  Filled 2023-01-03: qty 50

## 2023-01-03 MED ORDER — SODIUM CHLORIDE 0.9 % IV SOLN: Freq: Once | INTRAVENOUS | Status: AC

## 2023-01-03 MED ORDER — POTASSIUM CHLORIDE 10 MEQ/100ML IV SOLN
10.0000 meq | INTRAVENOUS | Status: AC
  Administered 2023-01-03 (×4): 10 meq via INTRAVENOUS
  Filled 2023-01-03 (×3): qty 100

## 2023-01-03 NOTE — ED Triage Notes (Signed)
Pt. To ED via EMS for SOB. Pt was 100% on RA on scene, but appeared distressed. Pt. Has bilateral leg swelling, and nausea during transport. Hx. Of A-fib.

## 2023-01-03 NOTE — ED Provider Notes (Signed)
[facility] Provider Note    Event Date/Time   First MD Initiated Contact with Patient 01/03/23 Joan Ingram     (approximate)   History   Shortness of breath   HPI  [name] is a 89 y.o. female who presents to the emergency department today from home via EMS for shortness of breath.  Patient unfortunately cannot give a great history.  Per EMS the patient lives alone.  Family apparently went to visit her noticed when they are talking to her that she was short of breath.  Patient has chronic lower extremity edema and actually states that it has been worse in the past.  In addition the shortness of breath the patient is complaining of some nausea as well.     Physical Exam   Triage Vital Signs: ED Triage Vitals  Enc Vitals Group     BP 01/03/23 1839 110/72     Pulse Rate 01/03/23 1847 (!) 101     Resp 01/03/23 1839 20     Temp 01/03/23 1847 98.1 F (36.7 C)     Temp Source 01/03/23 1847 Oral     SpO2 01/03/23 1839 100 %     Weight --      Height --      Head Circumference --      Peak Flow --      Pain Score 01/03/23 1846 0     Pain Loc --      Pain Edu? --      Excl. in [place]? --     Most recent vital signs: Vitals:   01/03/23 1839 01/03/23 1847  BP: 110/72   Pulse:  (!) 101  Resp: 20   Temp:  98.1 F (36.7 C)  SpO2: 100%     General: Awake, alert, not completely oriented. CV:  Good peripheral perfusion. Tachycardia. Resp:  Slightly increased work of breathing.  Abd:  No distention. Non tender. Rectal:  No blood on glove. GUAIC positive.    ED Results / Procedures / Treatments   Labs (all labs ordered are listed, but only abnormal results are displayed) Labs Reviewed  COMPREHENSIVE METABOLIC PANEL - Abnormal; Notable for the following components:      Result Value   Sodium 126 (*)    Potassium 2.9 (*)    Chloride 92 (*)    Glucose, Bld 136 (*)    Calcium 8.6 (*)    Total Protein 6.1 (*)    GFR, Estimated 59 (*)    All  other components within normal limits  CBC WITH DIFFERENTIAL/PLATELET - Abnormal; Notable for the following components:   RBC 1.94 (*)    Hemoglobin 4.8 (*)    HCT 15.7 (*)    MCH 24.7 (*)    RDW 16.5 (*)    nRBC 0.5 (*)    All other components within normal limits  RESP PANEL BY RT-PCR (RSV, FLU A&B, COVID)  RVPGX2  MAGNESIUM  URINALYSIS, COMPLETE (UACMP) WITH MICROSCOPIC  TYPE AND SCREEN  PREPARE RBC (CROSSMATCH)  ABO/RH     EKG  I, Phineas Semen, attending physician, personally viewed and interpreted this EKG  EKG Time: 1850 Rate: 94 Rhythm: atrial fibrillation Axis: normal Intervals: qtc 476 QRS: narrow ST changes: no st elevation Impression: abnormal ekg    RADIOLOGY I independently interpreted and visualized the CT head. My interpretation: No bleed Radiology interpretation:  IMPRESSION:  No acute intracranial abnormality.    Small vessel ischemic changes.  I independently interpreted and visualized the CXR. My interpretation: No pneumonia Radiology interpretation:  IMPRESSION:  1. New nodular opacities of the right hemithorax, concerning for  infection. Recommend follow-up PA and lateral chest x-ray in 6-8  weeks to ensure resolution.  2. Small left pleural effusion.      PROCEDURES:  Critical Care performed: Yes  CRITICAL CARE Performed by: Phineas Semen   Total critical care time: 40 minutes  Critical care time was exclusive of separately billable procedures and treating other patients.  Critical care was necessary to treat or prevent imminent or life-threatening deterioration.  Critical care was time spent personally by me on the following activities: development of treatment plan with patient and/or surrogate as well as nursing, discussions with consultants, evaluation of patient's response to treatment, examination of patient, obtaining history from patient or surrogate, ordering and performing treatments and interventions, ordering  and review of laboratory studies, ordering and review of radiographic studies, pulse oximetry and re-evaluation of patient's condition.   Procedures    MEDICATIONS ORDERED IN ED: Medications - No data to display   IMPRESSION / MDM / ASSESSMENT AND PLAN / ED COURSE  I reviewed the triage vital signs and the nursing notes.                              Differential diagnosis includes, but is not limited to, pneumonia, COVID, anemia  Patient's presentation is most consistent with acute presentation with potential threat to life or bodily function.   The patient is on the cardiac monitor to evaluate for evidence of arrhythmia and/or significant heart rate changes.  Patient presented to the emergency department today because of concerns for shortness of breath.  On exam patient does have slightly increased work of breathing.  Blood work was notable for significant anemia.  Rectal exam was then performed which was guaiac positive.  Additionally chest x-ray was performed which was concerning for pneumonia.  At this time I do think the anemia is likely somewhat chronic in nature.  Patient is neither significantly hypotensive nor tachycardic.  Will however plan on transfusion.  Discussed with patient and family over telephone.  Additionally given x-ray findings concerning for pneumonia will start IV antibiotics.  Discussed with Dr. Mikeal Hawthorne with the hospitalist service who will plan on admission.     FINAL CLINICAL IMPRESSION(S) / ED DIAGNOSES   Final diagnoses:  SOB (shortness of breath)  Anemia, unspecified type  Pneumonia due to infectious organism, unspecified laterality, unspecified part of lung     Note:  This document was prepared using Dragon voice recognition software and may include unintentional dictation errors.    Phineas Semen, MD 01/03/23 2252

## 2023-01-03 NOTE — H&P (Signed)
History and Physical    Patient: Joan Ingram ZOX:[id number] DOB: 1930/08/02 DOA: 01/03/2023 DOS: the patient was seen and examined on 01/03/2023 PCP: Enid Baas, MD  Patient coming from: Home  Chief Complaint:  Chief Complaint  Patient presents with   Shortness of Breath    Pt. To ED via EMS for SOB. Pt was 100% on RA on scene, but appeared distressed. Pt. Has bilateral leg swelling, and nausea during transport. Hx. Of A-fib.   HPI: Joan Ingram is a 89 y.o. female with medical history significant of GERD, essential hypertension, iron deficiency anemia, paroxysmal atrial fibrillation on Xarelto, asthma and osteoporosis who was brought in from home with shortness of breath.  Patient is a poor historian.  Mostly history obtained from family.  She does have chronic lower extremity edema.  She could not tell if she had melena or bright red blood per rectum.  In the ER patient was found to have hemoglobin of 4.8.  MCV is 80.  Patient additionally has new opacity in the right hemithorax concerning for pneumonia.  Some small left pleural effusion.  Head CT without contrast showed no acute findings.  At this point patient is being admitted with symptomatic anemia and possibly pneumonia.  Review of Systems: As mentioned in the history of present illness. All other systems reviewed and are negative. Past Medical History:  Diagnosis Date   Asthma    Esophageal reflux    Essential hypertension    IDA (iron deficiency anemia)    Paroxysmal atrial fibrillation (HCC)    Senile osteoporosis    Past Surgical History:  Procedure Laterality Date   CARDIOVERSION N/A 04/21/2020   Procedure: CARDIOVERSION;  Surgeon: Dalia Heading, MD;  Location: [facility] ORS;  Service: Cardiovascular;  Laterality: N/A;   colonoscopy     Social History:  reports that she has never smoked. She has never used smokeless tobacco. She reports that she does not drink alcohol. No history on file for drug  use.  Allergies  Allergen Reactions   Other Anaphylaxis   Aspirin    Astemizole Other (See Comments)   Benadryl [Diphenhydramine Hcl]    Ciprofloxacin    Codeine    Doxycycline    Fexofenadine Other (See Comments)   Lansoprazole Diarrhea   Levofloxacin Swelling   Loratadine Other (See Comments)   Macrobid [Nitrofurantoin Macrocrystal]    Morphine And Related    Omeprazole Diarrhea   Oxycodone    Pantoprazole Diarrhea   Penicillins    Rabeprazole Diarrhea   Terfenadine Swelling    No family history on file.  Prior to Admission medications   Medication Sig Start Date End Date Taking? Authorizing Provider  albuterol (VENTOLIN HFA) 108 (90 Base) MCG/ACT inhaler Inhale 2 puffs into the lungs every 6 (six) hours as needed for wheezing or shortness of breath.    [provider]  diltiazem (CARDIZEM CD) 120 MG 24 hr capsule Take 120 mg by mouth daily. Patient not taking: Reported on 09/20/2021    [provider]  furosemide (LASIX) 40 MG tablet Take 40 mg by mouth. Patient not taking: Reported on 07/24/2022    [provider]  ondansetron (ZOFRAN-ODT) 4 MG disintegrating tablet Take 1 tablet (4 mg total) by mouth every 8 (eight) hours as needed for nausea or vomiting. 07/24/22   Corena Herter, MD  rivaroxaban (XARELTO) 20 MG TABS tablet Take 20 mg by mouth daily with supper.    [provider]  SYMBICORT 80-4.5 MCG/ACT inhaler  Inhale into the lungs.    [provider]  torsemide (DEMADEX) 20 MG tablet Take 20 mg by mouth daily.    [provider]    Physical Exam: Vitals:   01/03/23 1839 01/03/23 1847  BP: 110/72   Pulse:  (!) 101  Resp: 20   Temp:  98.1 F (36.7 C)  TempSrc:  Oral  SpO2: 100%    Constitutional: NAD, calm, comfortable Eyes: PERRL, lids and conjunctivae normal ENMT: Mucous membranes are moist. Posterior pharynx clear of any exudate or lesions.Normal dentition.  Neck: normal, supple, no masses, no  thyromegaly Respiratory: Coarse breath sounds bilaterally, no wheezing, no crackles. Normal respiratory effort. No accessory muscle use.  Cardiovascular: Sinus tachycardia, no murmurs / rubs / gallops. No extremity edema. 2+ pedal pulses. No carotid bruits.  Abdomen: no tenderness, no masses palpated. No hepatosplenomegaly. Bowel sounds positive.  Musculoskeletal: Good range of motion, no joint swelling or tenderness, Skin: no rashes, lesions, ulcers. No induration Neurologic: CN 2-12 grossly intact. Sensation intact, DTR normal. Strength 5/5 in all 4.  Psychiatric: Confused but stable  Data Reviewed:  Sodium 126, potassium 2.9, chloride 92, glucose 136, calcium 8.6, hemoglobin 4.8 and platelets 373.  Chest x-ray showed new nodular opacity of the right hemithorax concerning for infection.  Head CT without contrast showed no acute findings.  Assessment and Plan:  #1 symptomatic pneumonia: Secondary to active GI bleed.  Patient probably has slow GI bleed due to being on Xarelto.  Will admit the patient and treat her for GI bleed.  IV Protonix.  GI consult in the morning.  Patient will also have Xarelto held.  Monitor H&H closely.  #2 GI bleed: Patient was stool guaiac positive.  Appears to be brown stool.  Most likely therefore this is slow GI bleed.  Again will need GI consult.  Holding Xarelto.  #3 pneumonia: Suspected community-acquired pneumonia.  Continue monitoring.  #4 paroxysmal atrial fibrillation: Rate will be controlled.  Holding Xarelto.  #5 hyponatremia: Probably chronic.  Probably dehydration.  Hydrate and monitor.  #6 hypokalemia: Replete potassium  #7 GERD: Continue PPIs.  #8 essential hypertension: Resume blood pressure medicines 1 7  #9 moderate pulmonary hypertension: Stable.     Advance Care Planning:   Code Status: Full Code   Consults: GI consult in the morning  Family Communication: Daughter over the phone  Severity of Illness: The appropriate patient  status for this patient is INPATIENT. Inpatient status is judged to be reasonable and necessary in order to provide the required intensity of service to ensure the patient's safety. The patient's presenting symptoms, physical exam findings, and initial radiographic and laboratory data in the context of their chronic comorbidities is felt to place them at high risk for further clinical deterioration. Furthermore, it is not anticipated that the patient will be medically stable for discharge from the hospital within 2 midnights of admission.   * I certify that at the point of admission it is my clinical judgment that the patient will require inpatient hospital care spanning beyond 2 midnights from the point of admission due to high intensity of service, high risk for further deterioration and high frequency of surveillance required.*  AuthorLonia Blood, MD 01/03/2023 9:09 PM  For on call review [email or web address].

## 2023-01-04 ENCOUNTER — Ambulatory Visit: Payer: PPO

## 2023-01-04 ENCOUNTER — Ambulatory Visit: Payer: PPO | Admitting: Oncology

## 2023-01-04 ENCOUNTER — Other Ambulatory Visit: Payer: PPO

## 2023-01-04 ENCOUNTER — Encounter: Payer: Self-pay | Admitting: Internal Medicine

## 2023-01-04 DIAGNOSIS — I480 Paroxysmal atrial fibrillation: Secondary | ICD-10-CM

## 2023-01-04 DIAGNOSIS — I10 Essential (primary) hypertension: Secondary | ICD-10-CM

## 2023-01-04 DIAGNOSIS — K219 Gastro-esophageal reflux disease without esophagitis: Secondary | ICD-10-CM

## 2023-01-04 DIAGNOSIS — D649 Anemia, unspecified: Secondary | ICD-10-CM | POA: Diagnosis present

## 2023-01-04 DIAGNOSIS — N390 Urinary tract infection, site not specified: Secondary | ICD-10-CM

## 2023-01-04 LAB — BPAM RBC
Blood Product Expiration Date: 202405122359
Blood Product Expiration Date: 202405122359
ISSUE DATE / TIME: 202404172125

## 2023-01-04 LAB — URINALYSIS, COMPLETE (UACMP) WITH MICROSCOPIC
Bilirubin Urine: NEGATIVE
Glucose, UA: NEGATIVE mg/dL
Hgb urine dipstick: NEGATIVE
Ketones, ur: 5 mg/dL — AB
Nitrite: NEGATIVE
Protein, ur: NEGATIVE mg/dL
Specific Gravity, Urine: 1.003 — ABNORMAL LOW (ref 1.005–1.030)
pH: 6 (ref 5.0–8.0)

## 2023-01-04 LAB — CBC
HCT: 21.8 % — ABNORMAL LOW (ref 36.0–46.0)
Hemoglobin: 7.1 g/dL — ABNORMAL LOW (ref 12.0–15.0)
MCH: 26.7 pg (ref 26.0–34.0)
MCHC: 32.6 g/dL (ref 30.0–36.0)
MCV: 82 fL (ref 80.0–100.0)
Platelets: 288 10*3/uL (ref 150–400)
RBC: 2.66 MIL/uL — ABNORMAL LOW (ref 3.87–5.11)
RDW: 15.6 % — ABNORMAL HIGH (ref 11.5–15.5)
WBC: 8.3 10*3/uL (ref 4.0–10.5)
nRBC: 0.8 % — ABNORMAL HIGH (ref 0.0–0.2)

## 2023-01-04 LAB — COMPREHENSIVE METABOLIC PANEL
ALT: 19 U/L (ref 0–44)
AST: 32 U/L (ref 15–41)
Albumin: 3.2 g/dL — ABNORMAL LOW (ref 3.5–5.0)
Alkaline Phosphatase: 36 U/L — ABNORMAL LOW (ref 38–126)
Anion gap: 8 (ref 5–15)
BUN: 10 mg/dL (ref 8–23)
CO2: 24 mmol/L (ref 22–32)
Calcium: 8.3 mg/dL — ABNORMAL LOW (ref 8.9–10.3)
Chloride: 97 mmol/L — ABNORMAL LOW (ref 98–111)
Creatinine, Ser: 0.81 mg/dL (ref 0.44–1.00)
GFR, Estimated: >60 mL/min (ref >60)
Glucose, Bld: 99 mg/dL (ref 70–99)
Potassium: 3.5 mmol/L (ref 3.5–5.1)
Sodium: 129 mmol/L — ABNORMAL LOW (ref 135–145)
Total Bilirubin: 2.3 mg/dL — ABNORMAL HIGH (ref 0.3–1.2)
Total Protein: 5.9 g/dL — ABNORMAL LOW (ref 6.5–8.1)

## 2023-01-04 LAB — PROCALCITONIN: Procalcitonin: <0.1 ng/mL

## 2023-01-04 MED ORDER — SODIUM CHLORIDE 0.9 % IV SOLN
200.0000 mg | INTRAVENOUS | Status: AC
  Administered 2023-01-04 – 2023-01-06 (×3): 200 mg via INTRAVENOUS
  Filled 2023-01-04 (×3): qty 200

## 2023-01-04 MED ORDER — MOMETASONE FURO-FORMOTEROL FUM 200-5 MCG/ACT IN AERO
2.0000 | INHALATION_SPRAY | Freq: Two times a day (BID) | RESPIRATORY_TRACT | Status: DC
  Administered 2023-01-05 – 2023-01-10 (×10): 2 via RESPIRATORY_TRACT
  Filled 2023-01-04: qty 8.8

## 2023-01-04 MED ORDER — PANTOPRAZOLE SODIUM 40 MG IV SOLR
40.0000 mg | Freq: Two times a day (BID) | INTRAVENOUS | Status: DC
  Administered 2023-01-04 – 2023-01-07 (×7): 40 mg via INTRAVENOUS
  Filled 2023-01-04 (×7): qty 10

## 2023-01-04 MED ORDER — POTASSIUM CHLORIDE 10 MEQ/100ML IV SOLN
INTRAVENOUS | Status: AC
  Administered 2023-01-04: 10 meq via INTRAVENOUS
  Filled 2023-01-04: qty 200

## 2023-01-04 MED ORDER — MOMETASONE FURO-FORMOTEROL FUM 100-5 MCG/ACT IN AERO: 2.0000 | INHALATION_SPRAY | Freq: Two times a day (BID) | RESPIRATORY_TRACT | Status: DC

## 2023-01-04 MED ORDER — ALBUTEROL SULFATE HFA 108 (90 BASE) MCG/ACT IN AERS: 2.0000 | INHALATION_SPRAY | Freq: Four times a day (QID) | RESPIRATORY_TRACT | Status: DC | PRN

## 2023-01-04 MED ORDER — MOMETASONE FURO-FORMOTEROL FUM 200-5 MCG/ACT IN AERO: 2.0000 | INHALATION_SPRAY | Freq: Two times a day (BID) | RESPIRATORY_TRACT | Status: DC

## 2023-01-04 MED ORDER — SODIUM CHLORIDE 0.9 % IV SOLN
1.0000 g | INTRAVENOUS | Status: AC
  Administered 2023-01-04 – 2023-01-06 (×3): 1 g via INTRAVENOUS
  Filled 2023-01-04: qty 10
  Filled 2023-01-04: qty 1
  Filled 2023-01-04: qty 10

## 2023-01-04 MED ORDER — LACTATED RINGERS IV SOLN: INTRAVENOUS | Status: DC

## 2023-01-04 MED ORDER — ONDANSETRON HCL 4 MG PO TABS: 4.0000 mg | ORAL_TABLET | Freq: Four times a day (QID) | ORAL | Status: DC | PRN

## 2023-01-04 MED ORDER — ONDANSETRON HCL 4 MG/2ML IJ SOLN
4.0000 mg | Freq: Four times a day (QID) | INTRAMUSCULAR | Status: DC | PRN
  Administered 2023-01-04 – 2023-01-09 (×3): 4 mg via INTRAVENOUS
  Filled 2023-01-04 (×3): qty 2

## 2023-01-04 MED ORDER — ORAL CARE MOUTH RINSE: 15.0000 mL | OROMUCOSAL | Status: DC | PRN

## 2023-01-04 MED ORDER — SODIUM CHLORIDE 0.9 % IV SOLN
500.0000 mg | INTRAVENOUS | Status: DC
  Administered 2023-01-04: 500 mg via INTRAVENOUS
  Filled 2023-01-04: qty 5

## 2023-01-04 MED ORDER — ALBUTEROL SULFATE (2.5 MG/3ML) 0.083% IN NEBU
2.5000 mg | INHALATION_SOLUTION | Freq: Four times a day (QID) | RESPIRATORY_TRACT | Status: DC | PRN
  Administered 2023-01-04: 2.5 mg via RESPIRATORY_TRACT
  Filled 2023-01-04: qty 3

## 2023-01-04 NOTE — ED Notes (Signed)
Pt resting comfortably at this time but does endorse feeling SOB, NAD noted pt on RA maintaining a oxygen sat of 98%, will give PRN breathing TX.

## 2023-01-04 NOTE — Progress Notes (Signed)
[facility]  - [place] at [facility]   PATIENT NAME: Joan Ingram    MR#:  [id number]  DATE OF BIRTH:  [birth year]  SUBJECTIVE:  no family at bedside. Spoke with stepdaughter Joan Ingram. Patient came in with shortness of breath and leg edema. She was found to have hemoglobin of 4.8. She had one episode of small amount of blood in her stool last Thursday. Denies any other rectal bleed or dark stools. Denies hematemesis.     VITALS:  Blood pressure 109/82, pulse 97, temperature 98.6 F (37 C), temperature source Oral, resp. rate (!) 28, SpO2 95 %.  PHYSICAL EXAMINATION:   GENERAL:  89 y.o.-year-old patient with no acute distress. Pallor+ LUNGS: Normal breath sounds bilaterally, no wheezing CARDIOVASCULAR: S1, S2 normal. No murmur   ABDOMEN: Soft, nontender, nondistended. Bowel sounds present.  EXTREMITIES: + edema b/l.    NEUROLOGIC: nonfocal  patient is alert and awake SKIN: No obvious rash, lesion, or ulcer.   LABORATORY PANEL:  CBC Recent Labs  Lab 01/04/23 0604  WBC 8.3  HGB 7.1*  HCT 21.8*  PLT 288    Chemistries  Recent Labs  Lab 01/03/23 1852 01/04/23 0604  NA  --  129*  K  --  3.5  CL  --  97*  CO2  --  24  GLUCOSE  --  99  BUN  --  10  CREATININE  --  0.81  CALCIUM  --  8.3*  MG 1.9  --   AST  --  32  ALT  --  19  ALKPHOS  --  36*  BILITOT  --  2.3*   Cardiac Enzymes No results for input(s): "TROPONINI" in the last 168 hours. RADIOLOGY:  CT Head Wo Contrast  Result Date: 01/03/2023 CLINICAL DATA:  Altered mental status EXAM: CT HEAD WITHOUT CONTRAST TECHNIQUE: Contiguous axial images were obtained from the base of the skull through the vertex without intravenous contrast. RADIATION DOSE REDUCTION: This exam was performed according to the departmental dose-optimization program which includes automated exposure control, adjustment of the mA and/or kV according to patient size and/or use of iterative reconstruction technique.  COMPARISON:  None Available. FINDINGS: Brain: No evidence of acute infarction, hemorrhage, hydrocephalus, extra-axial collection or mass lesion/mass effect. Subcortical white matter and periventricular small vessel ischemic changes. Vascular: Intracranial atherosclerosis. Skull: Normal. Negative for fracture or focal lesion. Sinuses/Orbits: The visualized paranasal sinuses are essentially clear. The mastoid air cells are unopacified. Other: None. IMPRESSION: No acute intracranial abnormality. Small vessel ischemic changes. Electronically Signed   By: Charline Bills M.D.   On: 01/03/2023 20:41   DG Chest Port 1 View  Result Date: 01/03/2023 CLINICAL DATA:  Questionable sepsis EXAM: PORTABLE CHEST 1 VIEW COMPARISON:  Chest x-ray dated July 24, 2022 FINDINGS: Unchanged cardiomegaly. Large hiatal hernia. Eventration of the right hemidiaphragm. Small left pleural effusion. New nodular opacities of the right hemithorax. No evidence of pneumothorax. IMPRESSION: 1. New nodular opacities of the right hemithorax, concerning for infection. Recommend follow-up PA and lateral chest x-ray in 6-8 weeks to ensure resolution. 2. Small left pleural effusion. Electronically Signed   By: Allegra Lai M.D.   On: 01/03/2023 19:37    Assessment and Plan  Joan Ingram is a 89 y.o. female with medical history significant of GERD, essential hypertension, iron deficiency anemia, paroxysmal atrial fibrillation on Xarelto, asthma and osteoporosis who was brought in from home with shortness of breath.  She does have chronic lower extremity edema.  She  has found to have hemoglobin of 4.8. Received two units of blood transfusion hemoglobin is 7.1. Denies any rectal bleed other than one episode last Thursday which was very small amount per patient "it was smear". Patient denies any cough today. Denies any fever.  Acute on chronic anemia-- suspect slow G.I. bleed in the setting of Xarelto history of iron deficiency  anemia -- patient came in with hemoglobin of 4.8-- two unit blood transfusion 7.1 -- baseline hemoglobin 10.3 -- patient gets iron infusion said the cancer center last one was September 2023 -- she follows with Joan Ingram. Spoke with Joan Ingram and okay to transfuse IV iron. -- Holding Xarelto -- patient is refusing any G.I. workup. Patient's stepdaughter Joan Ingram aware--GI consult cancelled -- Unable to tolerated oral iron  Abnormal chest x-ray with suspected right-sided patchy infiltrate -- clinically patient is not have cough or fever -- white count normal, pro calcitonin less than .10 -- clinically do not feel this is pneumonia  UTI/abnormal UA -- patient did not have any specific symptoms how were given bacterial urea and abnormal UA will treat with IV Rocephin three doses  History of hiatal hernia/Gerd -- patient is not tolerated PPI well. -- Will give Pepcid AC however patient does not want it  H/o PAF --spoke with [facility] cardiology PA--informed about pt and holding xarelto for now --pt will see dr Joan Ingram after discharge  --dter aware pt is off Xarelto for now  Hyponatremia/hypokalemia --126--improved to 129 --hold torsemide  --K repleted   Procedures: Family communication :step dter--Joan Ingram Consults :none CODE STATUS: FULL DVT Prophylaxis :SCD (anemia) Level of care: Telemetry Medical Status is: Inpatient Remains inpatient appropriate because: Anemia    TOTAL TIME TAKING CARE OF THIS PATIENT: 35 minutes.  >50% time spent on counselling and coordination of care  Note: This dictation was prepared with Dragon dictation along with smaller phrase technology. Any transcriptional errors that result from this process are unintentional.  Joan Ingram M.D    [facility]   CC: Primary care physician; Joan Baas, MD

## 2023-01-04 NOTE — ED Notes (Addendum)
Pt resting comfortably

## 2023-01-04 NOTE — ED Notes (Signed)
Pt called out at this time for a BM. This NT, NT Faith, and RN Casimiro Needle helped the pt to the bed side commode. Pt c/o being dizzy upon standing. Pt HR increased into the 160's-180's when standing and returned to baseline when sitting on commode and once back in bed. Peri care provided. New purewick placed. Pt linens changed. Pt placed back in bed comfortably with meal tray set up for pt.

## 2023-01-05 DIAGNOSIS — I480 Paroxysmal atrial fibrillation: Secondary | ICD-10-CM | POA: Diagnosis not present

## 2023-01-05 DIAGNOSIS — K219 Gastro-esophageal reflux disease without esophagitis: Secondary | ICD-10-CM | POA: Diagnosis not present

## 2023-01-05 DIAGNOSIS — I10 Essential (primary) hypertension: Secondary | ICD-10-CM | POA: Diagnosis not present

## 2023-01-05 DIAGNOSIS — D649 Anemia, unspecified: Secondary | ICD-10-CM | POA: Diagnosis not present

## 2023-01-05 LAB — BPAM RBC
Blood Product Expiration Date: 202405202359
ISSUE DATE / TIME: 202404180027
ISSUE DATE / TIME: 202404191049
Unit Type and Rh: 6200
Unit Type and Rh: 6200

## 2023-01-05 LAB — PREPARE RBC (CROSSMATCH)

## 2023-01-05 LAB — HEMOGLOBIN: Hemoglobin: 6.9 g/dL — ABNORMAL LOW (ref 12.0–15.0)

## 2023-01-05 LAB — HEMOGLOBIN AND HEMATOCRIT, BLOOD
HCT: 26.7 % — ABNORMAL LOW (ref 36.0–46.0)
Hemoglobin: 8.4 g/dL — ABNORMAL LOW (ref 12.0–15.0)

## 2023-01-05 MED ORDER — SODIUM CHLORIDE 0.9% IV SOLUTION: Freq: Once | INTRAVENOUS | Status: AC

## 2023-01-05 NOTE — Plan of Care (Signed)
?  Problem: Coping: ?Goal: Level of anxiety will decrease ?Outcome: Progressing ?  ?Problem: Safety: ?Goal: Ability to remain free from injury will improve ?Outcome: Progressing ?  ?

## 2023-01-05 NOTE — Care Management Important Message (Signed)
Important Message  Patient Details  Name: Joan Ingram MRN: [id number] Date of Birth: Oct 05, 1929   Medicare Important Message Given:  Yes     [name] 01/05/2023, 2:29 PM

## 2023-01-05 NOTE — Progress Notes (Signed)
Patient's family member brought in [facility]. Copies made and placed in patient's chart.   Madie Reno, RN

## 2023-01-05 NOTE — Progress Notes (Signed)
PT Cancellation Note  Patient Details Name: Joan Ingram MRN: [id number] DOB: [birth year]   Cancelled Treatment:    Reason Eval/Treat Not Completed: Patient not medically ready (Hb trending up but still in 6s, plans for addition PRBC. Will evaluate once medically ready at later date/time.)  1:52 PM, 01/05/23 Rosamaria Lints, PT, DPT Physical Therapist - [facility]  [phone] (ASCOM)    Daemyn Gariepy C 01/05/2023, 1:52 PM

## 2023-01-05 NOTE — Progress Notes (Signed)
[facility]  - [place] at [facility]   PATIENT NAME: Joan Ingram    MR#:  [id number]  DATE OF BIRTH:  [birth year]  SUBJECTIVE:  no family at bedside. . Patient came in with shortness of breath and leg edema. She was found to have hemoglobin of 4.8. She had one episode of small amount of blood in her stool last Thursday. Denies any other rectal bleed or dark stools. Denies hematemesis.   Patient pretentious today. Hemoglobin 6.9. Going to give her one unit blood transfusion. Sat with her and discuss the plan. Felt much better after that.   VITALS:  Blood pressure 110/78, pulse 95, temperature 98.2 F (36.8 C), temperature source Oral, resp. rate 16, height 5' (1.524 m), weight 64.2 kg, SpO2 96 %.  PHYSICAL EXAMINATION:   GENERAL:  89 y.o.-year-old patient with no acute distress. Pallor+ LUNGS: Normal breath sounds bilaterally, no wheezing CARDIOVASCULAR: S1, S2 normal. No murmur   ABDOMEN: Soft, nontender, nondistended. Bowel sounds present.  EXTREMITIES: + edema b/l.    NEUROLOGIC: nonfocal  patient is alert and awake SKIN: No obvious rash, lesion, or ulcer.   LABORATORY PANEL:  CBC Recent Labs  Lab 01/04/23 0604 01/05/23 0619  WBC 8.3  --   HGB 7.1* 6.9*  HCT 21.8*  --   PLT 288  --      Chemistries  Recent Labs  Lab 01/03/23 1852 01/04/23 0604  NA  --  129*  K  --  3.5  CL  --  97*  CO2  --  24  GLUCOSE  --  99  BUN  --  10  CREATININE  --  0.81  CALCIUM  --  8.3*  MG 1.9  --   AST  --  32  ALT  --  19  ALKPHOS  --  36*  BILITOT  --  2.3*    Cardiac Enzymes No results for input(s): "TROPONINI" in the last 168 hours. RADIOLOGY:  CT Head Wo Contrast  Result Date: 01/03/2023 CLINICAL DATA:  Altered mental status EXAM: CT HEAD WITHOUT CONTRAST TECHNIQUE: Contiguous axial images were obtained from the base of the skull through the vertex without intravenous contrast. RADIATION DOSE REDUCTION: This exam was performed according to the  departmental dose-optimization program which includes automated exposure control, adjustment of the mA and/or kV according to patient size and/or use of iterative reconstruction technique. COMPARISON:  None Available. FINDINGS: Brain: No evidence of acute infarction, hemorrhage, hydrocephalus, extra-axial collection or mass lesion/mass effect. Subcortical white matter and periventricular small vessel ischemic changes. Vascular: Intracranial atherosclerosis. Skull: Normal. Negative for fracture or focal lesion. Sinuses/Orbits: The visualized paranasal sinuses are essentially clear. The mastoid air cells are unopacified. Other: None. IMPRESSION: No acute intracranial abnormality. Small vessel ischemic changes. Electronically Signed   By: Charline Bills M.D.   On: 01/03/2023 20:41   DG Chest Port 1 View  Result Date: 01/03/2023 CLINICAL DATA:  Questionable sepsis EXAM: PORTABLE CHEST 1 VIEW COMPARISON:  Chest x-ray dated July 24, 2022 FINDINGS: Unchanged cardiomegaly. Large hiatal hernia. Eventration of the right hemidiaphragm. Small left pleural effusion. New nodular opacities of the right hemithorax. No evidence of pneumothorax. IMPRESSION: 1. New nodular opacities of the right hemithorax, concerning for infection. Recommend follow-up PA and lateral chest x-ray in 6-8 weeks to ensure resolution. 2. Small left pleural effusion. Electronically Signed   By: Allegra Lai M.D.   On: 01/03/2023 19:37    Assessment and Plan  Joan Ingram is a  89 y.o. female with medical history significant of GERD, essential hypertension, iron deficiency anemia, paroxysmal atrial fibrillation on Xarelto, asthma and osteoporosis who was brought in from home with shortness of breath.  She does have chronic lower extremity edema.  She has found to have hemoglobin of 4.8. Received two units of blood transfusion hemoglobin is 7.1. Denies any rectal bleed other than one episode last Thursday which was very small amount per  patient "it was smear". Patient denies any cough today. Denies any fever.  Acute on chronic anemia-- suspect slow G.I. bleed in the setting of Xarelto history of iron deficiency anemia -- patient came in with hemoglobin of 4.8-- two unit blood transfusion 7.1--6.9--1 unit today -- baseline hemoglobin 10.3 -- patient gets iron infusion said the cancer center last one was September 2023 -- she follows with Joan. Orlie Dakin. Spoke with Joan. Orlie Dakin and okay to transfuse IV iron. -- Holding Xarelto -- patient is refusing any G.I. workup. Patient's stepdaughter Joan Ingram aware--GI consult cancelled -- Unable to tolerated oral iron  Abnormal chest x-ray with suspected right-sided patchy infiltrate -- clinically patient is not have cough or fever -- white count normal, pro calcitonin less than .10 -- clinically do not feel this is pneumonia  UTI/abnormal UA -- patient did not have any specific symptoms how were given bacterial urea and abnormal UA will treat with IV Rocephin three doses  History of hiatal hernia/Gerd -- patient is not tolerated PPI well. -- Will give Pepcid AC however patient does not want it  H/o PAF --spoke with [facility] cardiology PA--informed about pt and holding xarelto for now --pt will see Joan Ingram after discharge  --dter aware pt is off Xarelto for now  Hyponatremia/hypokalemia --126--improved to 129 --hold torsemide  --K repleted   Procedures: Family communication :step dter--Joan Ingram Consults :none CODE STATUS: FULL DVT Prophylaxis :SCD (anemia) Level of care: Med-Surg Status is: Inpatient Remains inpatient appropriate because: Anemia    TOTAL TIME TAKING CARE OF THIS PATIENT: 35 minutes.  >50% time spent on counselling and coordination of care  Note: This dictation was prepared with Dragon dictation along with smaller phrase technology. Any transcriptional errors that result from this process are unintentional.  Enedina Finner M.D    [facility]    CC: Primary care physician; Enid Baas, MD

## 2023-01-05 NOTE — Progress Notes (Signed)
Mobility Specialist - Progress Note   01/05/23 1551  Mobility  Activity Refused mobility     Pt politely declined mobility; reports feeling too weak for OOB activity at this time. Pt just recently finished receiving blood transfusion. Will attempt another date/time.    Filiberto Pinks Mobility Specialist 01/05/23, 3:52 PM

## 2023-01-06 DIAGNOSIS — K219 Gastro-esophageal reflux disease without esophagitis: Secondary | ICD-10-CM | POA: Diagnosis not present

## 2023-01-06 DIAGNOSIS — I10 Essential (primary) hypertension: Secondary | ICD-10-CM | POA: Diagnosis not present

## 2023-01-06 DIAGNOSIS — I480 Paroxysmal atrial fibrillation: Secondary | ICD-10-CM | POA: Diagnosis not present

## 2023-01-06 DIAGNOSIS — D649 Anemia, unspecified: Secondary | ICD-10-CM | POA: Diagnosis not present

## 2023-01-06 LAB — BPAM RBC

## 2023-01-06 LAB — TYPE AND SCREEN
ABO/RH(D): A POS
Antibody Screen: NEGATIVE
Unit division: 0
Unit division: 0
Unit division: 0

## 2023-01-06 NOTE — NC FL2 (Signed)
[place] MEDICAID FL2 LEVEL OF CARE FORM     IDENTIFICATION  Patient Name: Joan Ingram Birthdate: 1929/12/12 Sex: female Admission Date (Current Location): 01/03/2023  [facility] and [place] Number:  Chiropodist and Address:  [facility], [address] [place], [place] [address]      Provider Number: [id number]  Attending Physician Name and Address:  Enedina Finner, MD  Relative Name and Phone Number:  Wendall Stade (Relative) [phone] [facility])    Current Level of Care: Hospital Recommended Level of Care: Skilled Nursing Facility Prior Approval Number: [id number]  Date Approved/Denied: 02/28/10 PASRR Number: [id number]  Discharge Plan: SNF    Current Diagnoses: Patient Active Problem List   Diagnosis Date Noted   Anemia 01/04/2023   Urinary tract infection without hematuria 01/04/2023   Symptomatic anemia 01/03/2023   Hyponatremia 01/03/2023   Hypokalemia 01/03/2023   Benign essential hypertension 03/16/2022   Esophageal reflux 03/16/2022   Moderate pulmonary hypertension 01/12/2022   Iron deficiency anemia 09/15/2021   Chronic heart failure with preserved ejection fraction 08/04/2021   Paroxysmal atrial fibrillation 02/26/2020    Orientation RESPIRATION BLADDER Height & Weight     Self, Time, Situation, Place  Normal Incontinent Weight: 64.2 kg Height:  5' (152.4 cm)  BEHAVIORAL SYMPTOMS/MOOD NEUROLOGICAL BOWEL NUTRITION STATUS      Continent (01/04/23) Diet  AMBULATORY STATUS COMMUNICATION OF NEEDS Skin   Extensive Assist Verbally Normal                       Personal Care Assistance Level of Assistance  Bathing, Feeding, Dressing Bathing Assistance: Maximum assistance Feeding assistance: Limited assistance Dressing Assistance: Maximum assistance     Functional Limitations Info  Sight Sight Info: Impaired (Corrective lenses/glasses)        SPECIAL CARE FACTORS FREQUENCY  PT (By licensed PT), OT  (By licensed OT)     PT Frequency: 5x/week OT Frequency: 5x/week            Contractures Contractures Info: Not present    Additional Factors Info  Code Status, Allergies Code Status Info: Full Code Allergies Info: Other, Aspirin, Astemizole, Benadryl (Diphenhydramine Hcl), Ciprofloxacin, Codeine, Doxycycline, Fexofenadine, Lansoprazole, Levofloxacin, Loratadine, Macrobid (Nitrofurantoin Macrocrystal), Morphine And Related, Omeprazole, Oxycodone, Pantoprazole, Penicillins, Rabeprazole, Terfenadine           Current Medications (01/06/2023):  This is the current hospital active medication list Current Facility-Administered Medications  Medication Dose Route Frequency Provider Last Rate Last Admin   albuterol (PROVENTIL) (2.5 MG/3ML) 0.083% nebulizer solution 2.5 mg  2.5 mg Nebulization Q6H PRN Otelia Sergeant, RPH   2.5 mg at 01/04/23 0730   mometasone-formoterol (DULERA) 200-5 MCG/ACT inhaler 2 puff  2 puff Inhalation BID Enedina Finner, MD   2 puff at 01/05/23 2144   ondansetron (ZOFRAN) tablet 4 mg  4 mg Oral Q6H PRN Rometta Emery, MD       Or   ondansetron (ZOFRAN) injection 4 mg  4 mg Intravenous Q6H PRN Rometta Emery, MD   4 mg at 01/04/23 0610   Oral care mouth rinse  15 mL Mouth Rinse PRN Enedina Finner, MD       pantoprazole (PROTONIX) injection 40 mg  40 mg Intravenous Q12H Rometta Emery, MD   40 mg at 01/06/23 1016     Discharge Medications: Please see discharge summary for a list of discharge medications.  Relevant Imaging Results:  Relevant Lab Results:   Additional Information SS# [id number]  Bing Quarry, RN

## 2023-01-06 NOTE — TOC Progression Note (Signed)
Transition of Care [facility]) - Progression Note    Patient Details  Name: Joan Ingram MRN: [id number] Date of Birth: Jan 29, 1930  Transition of Care [facility]) CM/SW Contact  Bing Quarry, RN Phone Number: 01/06/2023, 2:01 PM  Clinical Narrative: 01/06/23: Patient lived alone prior to admission. Spoke with stepdaughter and granddaughter on conference call regarding TOC role,  STR choices given via Medicare list, and bed search process. First three choices are [facility], [facility], [facility] in that order. NO [facility] or [facility]   Family also discussed concerns regarding if patient does not progress though rehab due to age/medical conditions. Discussed palliative consult to discuss and plan [facility]. Palliative consult requested passed online to provider and palliative added to secure chat.   Granddaughter Melvenia Needles with confirmation from POA Nicholes Mango requested to be first point of contact and she will contact the rest of any family meetings. She works and will arrange times and let the others know as they do not work. Her number is [phone].   RN CM will start bed search pending palliative follow up consult.  Gabriel Cirri RN CM [phone].          Expected Discharge Plan and Services                                               Social Determinants of Health (SDOH) Interventions SDOH Screenings   Food Insecurity: No Food Insecurity (01/04/2023)  Housing: Low Risk  (01/04/2023)  Transportation Needs: No Transportation Needs (01/04/2023)  Utilities: Not At Risk (01/04/2023)  Tobacco Use: Low Risk  (01/04/2023)    Readmission Risk Interventions     No data to display

## 2023-01-06 NOTE — Progress Notes (Addendum)
[facility]  - [place] at [facility]   PATIENT NAME: Joan Ingram    MR#:  [id number]  DATE OF BIRTH:  1930-06-22  SUBJECTIVE:  no family at bedside. . Patient came in with shortness of breath and leg edema. She was found to have hemoglobin of 4.8. She had one episode of small amount of blood in her stool last Thursday. Denies any other rectal bleed or dark stools. Denies hematemesis.   No family at bedside patient seen earlier. Ate good breakfast. She wants to consider going home. Explain to her will have to work with physical therapy and see how she does.  VITALS:  Blood pressure 107/68, pulse 93, temperature 98.5 F (36.9 C), resp. rate 20, height 5' (1.524 m), weight 64.2 kg, SpO2 94 %.  PHYSICAL EXAMINATION:   GENERAL:  89 y.o.-year-old patient with no acute distress. Pallor+ LUNGS: Normal breath sounds bilaterally, no wheezing CARDIOVASCULAR: S1, S2 normal. No murmur   ABDOMEN: Soft, nontender, nondistended. Bowel sounds present.  EXTREMITIES: + edema b/l.    NEUROLOGIC: nonfocal  patient is alert and awake SKIN: No obvious rash, lesion, or ulcer.   LABORATORY PANEL:  CBC Recent Labs  Lab 01/04/23 0604 01/05/23 0619 01/05/23 1600  WBC 8.3  --   --   HGB 7.1*   < > 8.4*  HCT 21.8*  --  26.7*  PLT 288  --   --    < > = values in this interval not displayed.     Chemistries  Recent Labs  Lab 01/03/23 1852 01/04/23 0604  NA  --  129*  K  --  3.5  CL  --  97*  CO2  --  24  GLUCOSE  --  99  BUN  --  10  CREATININE  --  0.81  CALCIUM  --  8.3*  MG 1.9  --   AST  --  32  ALT  --  19  ALKPHOS  --  36*  BILITOT  --  2.3*    Cardiac Enzymes No results for input(s): "TROPONINI" in the last 168 hours. RADIOLOGY:  No results found.  Assessment and Plan  Joan Ingram is a 89 y.o. female with medical history significant of GERD, essential hypertension, iron deficiency anemia, paroxysmal atrial fibrillation on Xarelto, asthma and  osteoporosis who was brought in from home with shortness of breath.  She does have chronic lower extremity edema.  She has found to have hemoglobin of 4.8. Received two units of blood transfusion hemoglobin is 7.1. Denies any rectal bleed other than one episode last Thursday which was very small amount per patient "it was smear". Patient denies any cough today. Denies any fever.  Acute on chronic anemia-- suspect slow G.I. bleed in the setting of Xarelto history of iron deficiency anemia -- patient came in with hemoglobin of 4.8-- two unit blood transfusion 7.1--6.9--1 unit today--8.4 -- baseline hemoglobin 10.3 -- patient gets iron infusion said the cancer center last one was September 2023 -- she follows with Dr. Orlie Dakin. Spoke with Dr. Orlie Dakin and okay to transfuse IV iron. -- Holding Xarelto -- patient is refusing any G.I. workup. Patient's stepdaughter [name] aware--GI consult cancelled -- Unable to tolerated oral iron  Abnormal chest x-ray with suspected right-sided patchy infiltrate -- clinically patient is not have cough or fever -- white count normal, pro calcitonin less than .10 -- clinically do not feel this is pneumonia  UTI/abnormal UA -- patient did not have any specific  symptoms how were given bacterial urea and abnormal UA will treat with IV Rocephin three doses  History of hiatal hernia/Gerd -- patient is not tolerated PPI well. -- Will give Pepcid AC however patient does not want it  H/o PAF --spoke with [facility] cardiology PA--informed about pt and holding xarelto for now --pt will see dr Cassie Freer after discharge  --dter aware pt is off Xarelto for now  Hyponatremia/hypokalemia --126--improved to 129 --hold torsemide  --K repleted  Generalized weakness and deconditioning. -- Patient seen by PT OT. She appears by deconditioned. She'll benefit from rehab for few days. Discussed with patient's daughter and informed her. Will have TOC work on discharge  planning   Procedures: Family communication :step dter--cameron Consults :none CODE STATUS: FULL DVT Prophylaxis :SCD (anemia) Level of care: Med-Surg Status is: Inpatient Remains inpatient appropriate because: Anemia  TOC for d/c planning  TOTAL TIME TAKING CARE OF THIS PATIENT: 35 minutes.  >50% time spent on counselling and coordination of care  Note: This dictation was prepared with Dragon dictation along with smaller phrase technology. Any transcriptional errors that result from this process are unintentional.  Enedina Finner M.D    [facility]   CC: Primary care physician; Enid Baas, MD

## 2023-01-06 NOTE — Evaluation (Signed)
Physical Therapy Evaluation Patient Details Name: Joan Ingram MRN: [id number] DOB: 1930-03-15 Today's Date: 01/06/2023  History of Present Illness  presented to ER secondary to SOB, LE edema; admitted for management of symptomatic anemia (admitting HgB 4.8, now corrected) due to GIB  Clinical Impression  Patient resting in bed upon arrival to room; appears generally weak and fatigued. Notably SOB at rest, endorsing she "just doesn't feel good".  Agreeable to participation with session as tolerated; eager for OOB to chair for position change.  Endorses generalized soreness, but no acute, focal pain.  Generally weak and deconditioned throughout all extremities; marked edema throughout LEs. No focal weakness, sensory or coordination deficit appreciated.  Currently requiring min assist for bed mobility; min/mod assist for sit/stand, standing balance and bed/chair transfer with RW.  Requires multiple attempts for initial sit/stand, heavy use of momentum and bilat UE support. Very heavily dependant on RW for transfer; limited ability to lift/clear LEs with SPT to chair. Notably fatigued with effort, BORG 6-7/10 after simple transfer; unable to tolerate additional activity as result. Sats >94% on RA, but did note drop in BP with transition to upright; suspect some impact of orthostasis with transition to upright  (see vitals flowsheet; MD informed/aware). Would benefit from skilled PT to address above deficits and promote optimal return to PLOF.; will benefit optimally from moderate intensity post-acute PT services (<3 hours/day) and consistent assist for ADLs/mobility.      Recommendations for follow up therapy are one component of a multi-disciplinary discharge planning process, led by the attending physician.  Recommendations may be updated based on patient status, additional functional criteria and insurance authorization.  Follow Up Recommendations Can patient physically be transported by private  vehicle: Yes     Assistance Recommended at Discharge Frequent or constant Supervision/Assistance  Patient can return home with the following  A lot of help with walking and/or transfers;A lot of help with bathing/dressing/bathroom    Equipment Recommendations    Recommendations for Other Services       Functional Status Assessment Patient has had a recent decline in their functional status and demonstrates the ability to make significant improvements in function in a reasonable and predictable amount of time.     Precautions / Restrictions Precautions Precautions: Fall Restrictions Weight Bearing Restrictions: No      Mobility  Bed Mobility Overal bed mobility: Needs Assistance Bed Mobility: Supine to Sit     Supine to sit: Min assist          Transfers Overall transfer level: Needs assistance Equipment used: Rolling walker (2 wheels) Transfers: Sit to/from Stand, Bed to chair/wheelchair/BSC Sit to Stand: Min assist, Mod assist Stand pivot transfers: Min assist         General transfer comment: multiple attempts for initial sit/stand, heavy use of momentum and bilat UE support.  Very heavily dependant on RW for transfer; limited ability to lift/clear LEs with SPT to chair.  Notably fatigued with effort, BORG 6-7/10 after simple transfer; unable to tolerate additional activity as result.  Sats >94% on RA, but did note drop in BP with transition to upright; suspect some impact of orthostasis with transition to upright    Ambulation/Gait               General Gait Details: unsafe/unable due to fatigue, orthostasis  Stairs            Wheelchair Mobility    Modified Rankin (Stroke Patients Only)       Balance Overall  balance assessment: Needs assistance Sitting-balance support: No upper extremity supported, Feet supported Sitting balance-Leahy Scale: Good     Standing balance support: Bilateral upper extremity supported Standing balance-Leahy  Scale: Fair                               Pertinent Vitals/Pain Pain Assessment Pain Assessment: No/denies pain    Home Living Family/patient expects to be discharged to:: Private residence Living Arrangements: Alone Available Help at Discharge: Family Type of Home: House Home Access: Stairs to enter Entrance Stairs-Rails: Doctor, general practice of Steps: 3   Home Layout: One level Home Equipment: Agricultural consultant (2 wheels)      Prior Function Prior Level of Function : Independent/Modified Independent             Mobility Comments: Mod indep with RW for basic household mobilization; denies fall history; no home O2.       Hand Dominance        Extremity/Trunk Assessment   Upper Extremity Assessment Upper Extremity Assessment: Generalized weakness    Lower Extremity Assessment Lower Extremity Assessment: Generalized weakness (grossly 4-/5 throughout; subjectively feels L LE is "weaker than normal"; gross edema throughout)       Communication   Communication: HOH  Cognition Arousal/Alertness: Awake/alert Behavior During Therapy: WFL for tasks assessed/performed Overall Cognitive Status: Within Functional Limits for tasks assessed                                          General Comments      Exercises     Assessment/Plan    PT Assessment Patient needs continued PT services  PT Problem List Decreased strength;Decreased activity tolerance;Decreased balance;Decreased mobility;Decreased coordination;Decreased knowledge of use of DME;Decreased safety awareness;Decreased knowledge of precautions;Cardiopulmonary status limiting activity       PT Treatment Interventions DME instruction;Gait training;Stair training;Functional mobility training;Therapeutic activities;Therapeutic exercise;Balance training;Patient/family education    PT Goals (Current goals can be found in the Care Plan section)  Acute Rehab PT  Goals Patient Stated Goal: to get stronger and return home PT Goal Formulation: With patient Time For Goal Achievement: 01/20/23 Potential to Achieve Goals: Good    Frequency Min 3X/week     Co-evaluation               AM-PAC PT "6 Clicks" Mobility  Outcome Measure Help needed turning from your back to your side while in a flat bed without using bedrails?: None Help needed moving from lying on your back to sitting on the side of a flat bed without using bedrails?: A Little Help needed moving to and from a bed to a chair (including a wheelchair)?: A Little Help needed standing up from a chair using your arms (e.g., wheelchair or bedside chair)?: A Lot Help needed to walk in hospital room?: A Lot Help needed climbing 3-5 steps with a railing? : A Lot 6 Click Score: 16    End of Session   Activity Tolerance: Patient tolerated treatment well Patient left: in chair;with call bell/phone within reach;with chair alarm set Nurse Communication: Mobility status PT Visit Diagnosis: Muscle weakness (generalized) (M62.81);Difficulty in walking, not elsewhere classified (R26.2)    Time: 8756-4332 PT Time Calculation (min) (ACUTE ONLY): 23 min   Charges:   PT Evaluation $PT Eval Moderate Complexity: 1 Mod  Wilgus Deyton H. Manson Passey, PT, DPT, NCS 01/06/23, 1:29 PM [phone]

## 2023-01-06 NOTE — Evaluation (Signed)
Occupational Therapy Evaluation Patient Details Name: Joan Ingram MRN: [id number] DOB: 1929-09-27 Today's Date: 01/06/2023   History of Present Illness presented to ER secondary to SOB, LE edema; admitted for management of symptomatic anemia (admitting HgB 4.8, now corrected) due to GIB   Clinical Impression   Ms Spivey was seen for OT evaluation this date. Prior to hospital admission, pt was MOD I using RW. Pt lives alone. Pt presents to acute OT demonstrating impaired ADL performance and functional mobility 2/2 decreased activity tolerance and functional strength/ROM/balance deficits. Pt currently requires MIN A + RW for BSC t/f, SUPERVISION pericare sitting. MOD A don underwear. No significant orthostatics noted this session however pt continues to endorse baseline and persistent nausea/weakness with activity. Pt would benefit from skilled OT to address noted impairments and functional limitations (see below for any additional details). Upon hospital discharge, recommend follow up therapy <3 hours/day.    Recommendations for follow up therapy are one component of a multi-disciplinary discharge planning process, led by the attending physician.  Recommendations may be updated based on patient status, additional functional criteria and insurance authorization.   Assistance Recommended at Discharge Intermittent Supervision/Assistance  Patient can return home with the following A little help with walking and/or transfers;A lot of help with bathing/dressing/bathroom;Help with stairs or ramp for entrance    Functional Status Assessment  Patient has had a recent decline in their functional status and demonstrates the ability to make significant improvements in function in a reasonable and predictable amount of time.  Equipment Recommendations  BSC/3in1    Recommendations for Other Services       Precautions / Restrictions Precautions Precautions: Fall Restrictions Weight Bearing  Restrictions: No      Mobility Bed Mobility                    Transfers Overall transfer level: Needs assistance Equipment used: Rolling walker (2 wheels) Transfers: Sit to/from Stand, Bed to chair/wheelchair/BSC Sit to Stand: Min assist     Step pivot transfers: Min assist            Balance Overall balance assessment: Needs assistance Sitting-balance support: No upper extremity supported, Feet supported Sitting balance-Leahy Scale: Good     Standing balance support: Bilateral upper extremity supported Standing balance-Leahy Scale: Fair                             ADL either performed or assessed with clinical judgement   ADL Overall ADL's : Needs assistance/impaired                                       General ADL Comments: MIN A + RW for BSC t/f, SUPERVISION pericare sitting. MOD A for LB access in sitting.      Pertinent Vitals/Pain Pain Assessment Pain Assessment: No/denies pain     Hand Dominance     Extremity/Trunk Assessment Upper Extremity Assessment Upper Extremity Assessment: Generalized weakness   Lower Extremity Assessment Lower Extremity Assessment: Generalized weakness       Communication Communication Communication: HOH   Cognition Arousal/Alertness: Awake/alert Behavior During Therapy: WFL for tasks assessed/performed Overall Cognitive Status: Within Functional Limits for tasks assessed  Home Living Family/patient expects to be discharged to:: Private residence Living Arrangements: Alone Available Help at Discharge: Family Type of Home: House Home Access: Stairs to enter Secretary/administrator of Steps: 3 Entrance Stairs-Rails: Right;Left Home Layout: One level               Home Equipment: Agricultural consultant (2 wheels)          Prior Functioning/Environment Prior Level of Function : Independent/Modified  Independent             Mobility Comments: Mod indep with RW for basic household mobilization; denies fall history; no home O2.          OT Problem List: Decreased strength;Decreased activity tolerance;Decreased range of motion;Impaired balance (sitting and/or standing);Decreased safety awareness      OT Treatment/Interventions: Self-care/ADL training;Therapeutic exercise;Energy conservation;DME and/or AE instruction;Therapeutic activities;Patient/family education;Balance training    OT Goals(Current goals can be found in the care plan section) Acute Rehab OT Goals Patient Stated Goal: to walk OT Goal Formulation: With patient/family Time For Goal Achievement: 01/20/23 Potential to Achieve Goals: Good ADL Goals Pt Will Perform Grooming: with modified independence;standing Pt Will Perform Lower Body Dressing: with modified independence;sit to/from stand Pt Will Transfer to Toilet: with modified independence;ambulating;regular height toilet  OT Frequency: Min 3X/week    Co-evaluation              AM-PAC OT "6 Clicks" Daily Activity     Outcome Measure Help from another person eating meals?: None Help from another person taking care of personal grooming?: A Little Help from another person toileting, which includes using toliet, bedpan, or urinal?: A Lot Help from another person bathing (including washing, rinsing, drying)?: A Lot Help from another person to put on and taking off regular upper body clothing?: A Little Help from another person to put on and taking off regular lower body clothing?: A Lot 6 Click Score: 16   End of Session Equipment Utilized During Treatment: Rolling walker (2 wheels)  Activity Tolerance: Patient tolerated treatment well Patient left: in chair;with call bell/phone within reach;with chair alarm set;with family/visitor present  OT Visit Diagnosis: Unsteadiness on feet (R26.81);Muscle weakness (generalized) (M62.81)                Time:  9629-5284 OT Time Calculation (min): 24 min Charges:  OT General Charges $OT Visit: 1 Visit OT Evaluation $OT Eval Moderate Complexity: 1 Mod OT Treatments $Self Care/Home Management : 8-22 mins  Kathie Dike, M.S. OTR/L  01/06/23, 3:35 PM  ascom [phone]

## 2023-01-07 DIAGNOSIS — Z515 Encounter for palliative care: Secondary | ICD-10-CM

## 2023-01-07 DIAGNOSIS — R0602 Shortness of breath: Secondary | ICD-10-CM | POA: Diagnosis not present

## 2023-01-07 DIAGNOSIS — Z7189 Other specified counseling: Secondary | ICD-10-CM | POA: Diagnosis not present

## 2023-01-07 DIAGNOSIS — I10 Essential (primary) hypertension: Secondary | ICD-10-CM | POA: Diagnosis not present

## 2023-01-07 DIAGNOSIS — I480 Paroxysmal atrial fibrillation: Secondary | ICD-10-CM | POA: Diagnosis not present

## 2023-01-07 DIAGNOSIS — K219 Gastro-esophageal reflux disease without esophagitis: Secondary | ICD-10-CM | POA: Diagnosis not present

## 2023-01-07 DIAGNOSIS — D649 Anemia, unspecified: Secondary | ICD-10-CM | POA: Diagnosis not present

## 2023-01-07 MED ORDER — ALUM & MAG HYDROXIDE-SIMETH 200-200-20 MG/5ML PO SUSP
30.0000 mL | ORAL | Status: DC | PRN
  Administered 2023-01-07: 30 mL via ORAL
  Filled 2023-01-07: qty 30

## 2023-01-07 MED ORDER — PANTOPRAZOLE SODIUM 40 MG PO TBEC: 40.0000 mg | DELAYED_RELEASE_TABLET | Freq: Every day | ORAL | Status: DC

## 2023-01-07 MED ORDER — TORSEMIDE 20 MG PO TABS
20.0000 mg | ORAL_TABLET | Freq: Every day | ORAL | Status: DC
  Administered 2023-01-07 – 2023-01-10 (×4): 20 mg via ORAL
  Filled 2023-01-07 (×4): qty 1

## 2023-01-07 MED ORDER — PANTOPRAZOLE SODIUM 40 MG PO TBEC
40.0000 mg | DELAYED_RELEASE_TABLET | Freq: Two times a day (BID) | ORAL | Status: DC
  Administered 2023-01-07 – 2023-01-10 (×6): 40 mg via ORAL
  Filled 2023-01-07 (×6): qty 1

## 2023-01-07 NOTE — Progress Notes (Signed)
End of Shift Summary:  Date: 01/07/23 Shift: 1900-0700 Ambulatory: x1 assist with walker Significant Events: patient became tachy >140 twice overnight for a short period; each time patient stated she "just woke up scared" and with reassurance went back to resting quietly with HR in the 70-90s. Patient is not diaphoretic, does not feel dizzy or lightheaded, and does not complain of palpitations. Patient remained on the monitor with vital signs WNL.

## 2023-01-07 NOTE — Consult Note (Signed)
Consultation Note Date: 01/07/2023   Patient Name: Joan Ingram  DOB: [birth year]  MRN: [id number]  Age / Sex: 89 y.o., female  PCP: Enid Baas, MD Referring Physician: Enedina Finner, MD  Reason for Consultation: Establishing goals of care   HPI/Brief Hospital Course: 89 y.o. female  with past medical history of HTN, iron deficiency anemia has received outpatient iron infusions through cancer center (last infusion Sept. 2023), paroxsymal atrial fibrillation on Xarelto, asthma and osteoporosis admitted from home on 01/03/2023 with increased shortness of breath for several days. Noted chronic lower extremity edema.  Work-up revealed profound anemia at 4.8 requiring blood transfusions. She reports only one episode of rectal bleeding several days prior to admission and reports a small amount of blood. Denies further bleeding episodes.  Acute anemia felt to be related to slow GI bleed in the setting of Xarelto use and underlying IDA. -Refuses GI work-up   Palliative medicine was consulted for assisting with goals of care conversations.  Subjective:  Extensive chart review has been completed prior to meeting patient including labs, vital signs, imaging, progress notes, orders, and available advanced directive documents from current and previous encounters.  Visited with [name] at her bedside. Resting peacefully with eyes closed.  Called and spoke with [name]-stepdaughter (HCPOA). Introduced my role as Palliative Care, NP. Offering support to patient and family/support persons, focusing on improvement of symptoms and improved quality of life. [name] shares she was anticipating Palliative consult but was told an in person meeting would he held tomorrow 01/08/2023. She expressed her understanding as Palliative Care role as being able to provide care after [name] leaves SNF if she is unable to return home. Attempted several times to clarify role of  Palliative Care. Briefly talked about options such as LTC, outpatient Palliative Services or Hospice services available pending eligibility. [name] shares other family members voicing concern on preparing for [name] to not be able to come home.   [name] also quite concerned about [name]'s BLE edema. She feels swelling and discoloration is much worse compared to baseline.  Throughout conversation, questions full understanding of PMT role. [name] continues to reference needing to have in person meeting with Palliative Provider tomorrow even after sharing I was part of PMT. Attempted to answer and address all questions and concerns.  Later returned to visit with [name]. Awake and alert, able to answer orientation questions appropriately. Denies acute pain or discomfort. Denies SHOB or difficulty breathing.  Introduced myself as a Publishing rights manager as a member of the palliative care team. Explained palliative medicine is specialized medical care for people living with serious illness. It focuses on providing relief from the symptoms and stress of a serious illness. The goal is to improve quality of life for both the patient and the family.   [name] confirms prior to admission she was living at home independently and able to care for herself. Required assistance with driving. She is widowed and does not have biological children of her own.  Shared with [name] [name]'s concerns regarding BLE edema. [name] shares that her legs are at their baseline-no increased swelling, discomfort or discoloration. [name] shares that most of her family would not know what her legs look like at baseline.  [name] able to share her understanding of current medical condition. Shares she understands she will likely need SNF at discharge and is in agreement with this.  [name] shares that she has completed Advanced Directive documentation in the past.  She shares that she would not  want long term life preserving measures. We discussed Code Statues-Full Code versus DNR pathways. [name] shares she wishes to be DNR and thought this was already documented in her chart.,  She shares that when she completed AD documentation her granddaughter was too young to be appointed as [place] but she now wishes to also have her involved in the decision making process.  [name] is hopeful she will be able to regain her strength and mobility while at SNF but also remains realistic that she may not be able to return home and would agree to LTC if able to qualify.  Shared that family members requesting in person meeting with PMT. Shared I will not be on service starting tomorrow but a provider from PMT will follow-up.  All questions/concerns addressed. Emotional support provided to patient. PMT will continue to follow and support patient as needed.  Objective: Primary Diagnoses: Present on Admission:  Symptomatic anemia  Chronic heart failure with preserved ejection fraction  Moderate pulmonary hypertension  Paroxysmal atrial fibrillation  Benign essential hypertension  Esophageal reflux  Hyponatremia  Hypokalemia  Anemia   Physical Exam Constitutional:      General: She is not in acute distress. Cardiovascular:     Rate and Rhythm: Normal rate.  Pulmonary:     Effort: Pulmonary effort is normal. No respiratory distress.  Skin:    General: Skin is warm and dry.  Neurological:     Mental Status: She is alert.     Vital Signs: BP 125/72 (BP Location: Right Arm)   Pulse 96   Temp 98.2 F (36.8 C) (Oral)   Resp 18   Ht 5' (1.524 m)   Wt 64.2 kg   SpO2 98%   BMI 27.64 kg/m  Pain Scale: 0-10   Pain Score: 0-No pain  Palliative Assessment/Data: 60%   Assessment and Plan  SUMMARY OF RECOMMENDATIONS   DNR In person meeting tomorrow as requested by family PMT to continue to follow for ongoing needs and support   Discussed With: Primary team   Thank you  for this consult and allowing Palliative Medicine to participate in the care of Joan Ingram. Palliative medicine will continue to follow and assist as needed.   Time Total: 75 minutes  Time spent includes: Detailed review of medical records (labs, imaging, vital signs), medically appropriate exam (mental status, respiratory, cardiac, skin), discussed with treatment team, counseling and educating patient, family and staff, documenting clinical information, medication management and coordination of care.   Signed by: Leeanne Deed, DNP, AGNP-C Palliative Medicine    Please contact Palliative Medicine Team phone at [phone] for questions and concerns.  For individual provider: See Loretha Stapler

## 2023-01-07 NOTE — Progress Notes (Signed)
[facility]  - [place] at [facility]   PATIENT NAME: Joan Ingram    MR#:  [id number]  DATE OF BIRTH:  [birth year]  SUBJECTIVE:  no family at bedside.  Ate good breakfast.  Patient in agreement with rehab. VITALS:  Blood pressure 125/72, pulse 96, temperature 98.2 F (36.8 C), temperature source Oral, resp. rate 18, height 5' (1.524 m), weight 64.2 kg, SpO2 98 %.  PHYSICAL EXAMINATION:   GENERAL:  89 y.o.-year-old patient with no acute distress. Pallor+ LUNGS: Normal breath sounds bilaterally, no wheezing CARDIOVASCULAR: S1, S2 normal. No murmur   ABDOMEN: Soft, nontender, nondistended. Bowel sounds present.  EXTREMITIES: + edema b/l.    NEUROLOGIC: nonfocal  patient is alert and awake SKIN: No obvious rash, lesion, or ulcer.   LABORATORY PANEL:  CBC Recent Labs  Lab 01/04/23 0604 01/05/23 0619 01/05/23 1600  WBC 8.3  --   --   HGB 7.1*   < > 8.4*  HCT 21.8*  --  26.7*  PLT 288  --   --    < > = values in this interval not displayed.     Chemistries  Recent Labs  Lab 01/03/23 1852 01/04/23 0604  NA  --  129*  K  --  3.5  CL  --  97*  CO2  --  24  GLUCOSE  --  99  BUN  --  10  CREATININE  --  0.81  CALCIUM  --  8.3*  MG 1.9  --   AST  --  32  ALT  --  19  ALKPHOS  --  36*  BILITOT  --  2.3*     RADIOLOGY:  No results found.  Assessment and Plan  Joan Ingram is a 89 y.o. female with medical history significant of GERD, essential hypertension, iron deficiency anemia, paroxysmal atrial fibrillation on Xarelto, asthma and osteoporosis who was brought in from home with shortness of breath.  She does have chronic lower extremity edema.  She has found to have hemoglobin of 4.8. Received two units of blood transfusion hemoglobin is 7.1. Denies any rectal bleed other than one episode last Thursday which was very small amount per patient "it was smear". Patient denies any cough today. Denies any fever.  Acute on chronic anemia--  suspect slow G.I. bleed in the setting of Xarelto history of iron deficiency anemia -- patient came in with hemoglobin of 4.8-- two unit blood transfusion 7.1--6.9--1 unit t--8.4 -- baseline hemoglobin 10.3 -- patient gets iron infusion said the cancer center last one was September 2023 -- she follows with Dr. Orlie Dakin. Spoke with Dr. Orlie Dakin and okay to transfuse IV iron. -- Holding Xarelto -- patient is refusing any G.I. workup. Patient's stepdaughter [name] aware--GI consult cancelled -- Unable to tolerated oral iron  Abnormal chest x-ray with suspected right-sided patchy infiltrate -- clinically patient is not have cough or fever -- white count normal, pro calcitonin less than .10 -- clinically do not feel this is pneumonia  UTI/abnormal UA -- patient did not have any specific symptoms how were given bacterial urea and abnormal UA will treat with IV Rocephin three doses  History of hiatal hernia/Gerd -- PO PPI  H/o PAF --spoke with [facility] cardiology PA--informed about pt and holding xarelto for now --pt will see dr Cassie Freer after discharge  --dter aware pt is off Xarelto for now  Hyponatremia/hypokalemia --126--improved to 129 --hold torsemide  --K repleted -- resume torsemide  Generalized weakness and deconditioning. -- Patient  seen by PT OT. She appears by deconditioned. She'll benefit from rehab for few days. Discussed with patient's daughter and informed her. Will have TOC work on discharge planning  Palliative care consultation appreciated. I discussed the code status with patient. Patient is requesting DNR DNI. This was relayed to patient's daughter [name] and she is in agreement.   Procedures: Family communication :step dter--cameron Consults :none CODE STATUS: FDNR/DNI DVT Prophylaxis :SCD (anemia) Level of care: Med-Surg Status is: Inpatient Remains inpatient appropriate because: Anemia  TOC for d/c planning  TOTAL TIME TAKING CARE OF THIS PATIENT: 35  minutes.  >50% time spent on counselling and coordination of care  Note: This dictation was prepared with Dragon dictation along with smaller phrase technology. Any transcriptional errors that result from this process are unintentional.  Enedina Finner M.D    [facility]   CC: Primary care physician; Enid Baas, MD

## 2023-01-07 NOTE — TOC Progression Note (Signed)
Transition of Care [facility]) - Progression Note    Patient Details  Name: Joan Ingram MRN: [id number] Date of Birth: June 10, 1930  Transition of Care [facility]) CM/SW Contact  Bing Quarry, RN Phone Number: 01/07/2023, 3:31 PM  Clinical Narrative:  01/07/23: Bed search initiated 01/06/23. All beds pending in HUB. Gabriel Cirri RN CM           Expected Discharge Plan and Services                                               Social Determinants of Health (SDOH) Interventions SDOH Screenings   Food Insecurity: No Food Insecurity (01/04/2023)  Housing: Low Risk  (01/04/2023)  Transportation Needs: No Transportation Needs (01/04/2023)  Utilities: Not At Risk (01/04/2023)  Tobacco Use: Low Risk  (01/04/2023)    Readmission Risk Interventions     No data to display

## 2023-01-08 DIAGNOSIS — J189 Pneumonia, unspecified organism: Secondary | ICD-10-CM

## 2023-01-08 DIAGNOSIS — I480 Paroxysmal atrial fibrillation: Secondary | ICD-10-CM | POA: Diagnosis not present

## 2023-01-08 DIAGNOSIS — I10 Essential (primary) hypertension: Secondary | ICD-10-CM | POA: Diagnosis not present

## 2023-01-08 DIAGNOSIS — R0602 Shortness of breath: Secondary | ICD-10-CM | POA: Diagnosis not present

## 2023-01-08 DIAGNOSIS — N390 Urinary tract infection, site not specified: Secondary | ICD-10-CM | POA: Diagnosis not present

## 2023-01-08 DIAGNOSIS — D649 Anemia, unspecified: Secondary | ICD-10-CM | POA: Diagnosis not present

## 2023-01-08 DIAGNOSIS — K219 Gastro-esophageal reflux disease without esophagitis: Secondary | ICD-10-CM | POA: Diagnosis not present

## 2023-01-08 MED ORDER — SODIUM CHLORIDE 0.9 % IV SOLN
200.0000 mg | INTRAVENOUS | Status: DC
  Administered 2023-01-08: 200 mg via INTRAVENOUS
  Filled 2023-01-08: qty 200
  Filled 2023-01-08: qty 10

## 2023-01-08 NOTE — TOC Progression Note (Addendum)
Transition of Care [facility]) - Progression Note    Patient Details  Name: Joan Ingram MRN: [id number] Date of Birth: [birth year]  Transition of Care [facility]) CM/SW Contact  Chapman Fitch, RN Phone Number: 01/08/2023, 3:46 PM  Clinical Narrative:        Bed offers presented to patient and granddaughter Shelee. Accepted at [facility].  Accepted bed in [facility], notified Tammy at [facility]  VM left for [facility] to start auth    Update : received return call from [facility] at [facility].  Auth started for [facility]   Expected Discharge Plan and Services                                               Social Determinants of Health (SDOH) Interventions SDOH Screenings   Food Insecurity: No Food Insecurity (01/04/2023)  Housing: Low Risk  (01/04/2023)  Transportation Needs: No Transportation Needs (01/04/2023)  Utilities: Not At Risk (01/04/2023)  Tobacco Use: Low Risk  (01/04/2023)    Readmission Risk Interventions     No data to display

## 2023-01-08 NOTE — Progress Notes (Signed)
Palliative Care Progress Note, Assessment & Plan   Patient Name: Joan Ingram       Date: 01/08/2023 DOB: 09/21/1929  Age: 89 y.o. MRN#: [id number] Attending Physician: Enedina Finner, MD Primary Care Physician: Enid Baas, MD Admit Date: 01/03/2023  Subjective: Patient is out of bed and sitting in recliner.  She acknowledges my presence and is able to make her wishes known.  She is alert and oriented x 4.  She has no acute complaints at this time.  She asked that I let her eat her lunch.  She request that I speak with her granddaughter Lunette Stands in regards to medical updates and plan of care.  HPI: 89 y.o. female  with past medical history of HTN, iron deficiency anemia has received outpatient iron infusions through cancer center (last infusion Sept. 2023), paroxsymal atrial fibrillation on Xarelto, asthma and osteoporosis admitted from home on 01/03/2023 with increased shortness of breath for several days. Noted chronic lower extremity edema.   Work-up revealed profound anemia at 4.8 requiring blood transfusions. She reports only one episode of rectal bleeding several days prior to admission and reports a small amount of blood. Denies further bleeding episodes.   Acute anemia felt to be related to slow GI bleed in the setting of Xarelto use and underlying IDA. -Refuses GI work-up    Palliative medicine was consulted for assisting with goals of care conversations.  Summary of counseling/coordination of care: After reviewing the patient's chart and assessing the patient at bedside, I spoke with patient's granddaughter over the phone.  We reviewed patient's acute and chronic illnesses.  Education provided on anemia, acute on chronic HFpEF, paroxysmal A-fib, and advanced age as significant indicators of  overall prognosis.  I highlighted that patient still has capacity to make decisions and conveyed her wishes.  DNR remains.  Treat the treatable.  Patient ready and willing to go to SNF for rehab.  Lunette Stands is concerned about what happens with patient's care after rehab.  We discussed that patient's functional/nutritional, and cognitive status will likely be at a different baseline.  At the end of rehab, medical assessments and decision making will have to guide next steps for patient at that time.  LTC as well as home with family/friends/home health aides. The difference between palliative care and hospice services was discussed.  Additionally, we reviewed the difference between SNF/rehab and LTC. O conveyed taht TOC is following closely for discharge planning.  Plan is for patient to [facility] for [facility]/rehab when medically stable. Thus, I introduced the concept of a MOST form.  Paper copy left at bedside and electronic copy sent to [facility].  We discussed the importance of making patient's wishes known as she transfers from one care facility to another.  Therapeutic silence and active listening provided for Shelee to share her thoughts and emotions regarding current medical situation.  Emotional support provided.  PMT contact info given.  Patient and family encouraged to reach out with any future palliative needs.  Palliative services discussed and offered.  Patient and family accepting of this level of support at discharge.  Outpatient palliative referral placed for TOC to offer choice.  I will revisit concept of MOST with  patient again tomorrow and PMT will continue to follow/support.   Physical Exam Vitals reviewed.  Constitutional:      General: She is not in acute distress.    Appearance: She is normal weight. She is not ill-appearing.  HENT:     Head: Normocephalic.  Cardiovascular:     Rate and Rhythm: Normal rate. Rhythm irregular.  Pulmonary:     Effort: Pulmonary  effort is normal.  Musculoskeletal:     Cervical back: Normal range of motion.     Comments: Generalized weakness  Neurological:     Mental Status: She is alert and oriented to person, place, and time.  Psychiatric:        Mood and Affect: Mood normal. Mood is not anxious.             Total Time 50 minutes   Jerimah Witucki L. Manon Hilding, FNP-BC Palliative Medicine Team Team Phone # [phone]

## 2023-01-08 NOTE — Progress Notes (Signed)
[facility]  - [place] at [facility]   PATIENT NAME: Joan Ingram    MR#:  [id number]  DATE OF BIRTH:  [birth year]  SUBJECTIVE:  no family at bedside. No issues per RN. Gets SOB with exertion. Good uop with po torsemide Patient in agreement with rehab. VITALS:  Blood pressure 115/71, pulse 96, temperature 98.2 F (36.8 C), resp. rate 18, height 5' (1.524 m), weight 64.2 kg, SpO2 96 %.  PHYSICAL EXAMINATION:   GENERAL:  89 y.o.-year-old patient with no acute distress. Pallor+ LUNGS: Normal breath sounds bilaterally, no wheezing CARDIOVASCULAR: S1, S2 normal. No murmur   ABDOMEN: Soft, nontender, nondistended. Bowel sounds present.  EXTREMITIES: + edema b/l.    NEUROLOGIC: nonfocal  patient is alert and awake SKIN: No obvious rash, lesion, or ulcer.   LABORATORY PANEL:  CBC Recent Labs  Lab 01/04/23 0604 01/05/23 0619 01/05/23 1600  WBC 8.3  --   --   HGB 7.1*   < > 8.4*  HCT 21.8*  --  26.7*  PLT 288  --   --    < > = values in this interval not displayed.     Chemistries  Recent Labs  Lab 01/03/23 1852 01/04/23 0604  NA  --  129*  K  --  3.5  CL  --  97*  CO2  --  24  GLUCOSE  --  99  BUN  --  10  CREATININE  --  0.81  CALCIUM  --  8.3*  MG 1.9  --   AST  --  32  ALT  --  19  ALKPHOS  --  36*  BILITOT  --  2.3*     RADIOLOGY:  No results found.  Assessment and Plan  Joan Ingram is a 89 y.o. female with medical history significant of GERD, essential hypertension, iron deficiency anemia, paroxysmal atrial fibrillation on Xarelto, asthma and osteoporosis who was brought in from home with shortness of breath.  She does have chronic lower extremity edema.  She has found to have hemoglobin of 4.8. Received two units of blood transfusion hemoglobin is 7.1. Denies any rectal bleed other than one episode last Thursday which was very small amount per patient "it was smear". Patient denies any cough today. Denies any fever.  Acute on  chronic anemia-- suspect slow G.I. bleed in the setting of Xarelto history of iron deficiency anemia -- patient came in with hemoglobin of 4.8-- two unit blood transfusion 7.1--6.9--1 unit t--8.4 -- baseline hemoglobin 10.3 -- patient gets iron infusion said the cancer center last one was September 2023 -- she follows with Dr. Orlie Dakin. Spoke with Dr. Orlie Dakin and okay to transfuse IV iron. Plan for 5 doses -- Holding Xarelto -- patient is refusing any G.I. workup. Patient's stepdaughter Joan Ingram aware--GI consult cancelled -- Unable to tolerated oral iron  Abnormal chest x-ray with suspected right-sided patchy infiltrate Acute on Chronic HFpEF in the setting of anemia -- clinically patient is not have cough or fever -- white count normal, pro calcitonin less than .10 -- clinically do not feel this is pneumonia --resumed torsemide--good diureses. Sats >92% on RA  UTI/abnormal UA -- patient did not have any specific symptoms how were given bacterial urea and abnormal UA will treat with IV Rocephin three doses  History of hiatal hernia/Gerd -- PO PPI  H/o PAF --spoke with [facility] cardiology PA--informed about pt and holding xarelto for now --pt will see dr Cassie Freer after discharge  --dter aware pt  is off Xarelto for now  Hyponatremia/hypokalemia --126--improved to 129 --hold torsemide  --K repleted -- resume torsemide  Generalized weakness and deconditioning. -- Patient seen by PT OT. She appears by deconditioned. She'll benefit from rehab for few days. Discussed with patient's daughter and informed her. Will have TOC work on discharge planning  Palliative care consultation appreciated. I discussed the code status with patient. Patient is requesting DNR DNI. This was relayed to patient's daughter Joan Ingram and she is in agreement.   Procedures: Family communication :step dter--cameron Consults :none CODE STATUS: FDNR/DNI DVT Prophylaxis :SCD (anemia) Level of care: Med-Surg Status  is: Inpatient Remains inpatient appropriate because: Anemia  TOC for d/c planning  TOTAL TIME TAKING CARE OF THIS PATIENT: 35 minutes.  >50% time spent on counselling and coordination of care  Note: This dictation was prepared with Dragon dictation along with smaller phrase technology. Any transcriptional errors that result from this process are unintentional.  Enedina Finner M.D    [facility]   CC: Primary care physician; Enid Baas, MD

## 2023-01-08 NOTE — Care Management Important Message (Signed)
Important Message  Patient Details  Name: Joan Ingram MRN: [id number] Date of Birth: [birth year]   Medicare Important Message Given:  Yes     [name] 01/08/2023, 10:56 AM

## 2023-01-08 NOTE — Progress Notes (Signed)
Physical Therapy Treatment Patient Details Name: Joan Ingram MRN: [id number] DOB: [birth year] Today's Date: 01/08/2023   History of Present Illness presented to ER secondary to SOB, LE edema; admitted for management of symptomatic anemia (admitting HgB 4.8, now corrected) due to GIB    PT Comments    Pt resting in bed upon PT arrival; agreeable to therapy.  During session pt SBA semi-supine to sitting edge of bed; CGA to min assist with transfers using RW; and CGA to ambulate 40 feet with RW use (limited d/t SOB and generalized weakness).  Will continue to focus on strengthening and progressive mobility during hospitalization.    Recommendations for follow up therapy are one component of a multi-disciplinary discharge planning process, led by the attending physician.  Recommendations may be updated based on patient status, additional functional criteria and insurance authorization.  Follow Up Recommendations  Can patient physically be transported by private vehicle: Yes    Assistance Recommended at Discharge Frequent or constant Supervision/Assistance  Patient can return home with the following A little help with walking and/or transfers;A little help with bathing/dressing/bathroom;Assistance with cooking/housework;Assist for transportation;Help with stairs or ramp for entrance   Equipment Recommendations  Rolling walker (2 wheels);BSC/3in1    Recommendations for Other Services       Precautions / Restrictions Precautions Precautions: Fall Restrictions Weight Bearing Restrictions: No     Mobility  Bed Mobility Overal bed mobility: Needs Assistance Bed Mobility: Supine to Sit     Supine to sit: Supervision, HOB elevated     General bed mobility comments: increased effort to perform on own    Transfers Overall transfer level: Needs assistance Equipment used: Rolling walker (2 wheels) Transfers: Sit to/from Stand Sit to Stand: Min guard, Min assist            General transfer comment: increased effort to stand; vc's for UE placement; vc's to line up to chair prior to sitting    Ambulation/Gait Ambulation/Gait assistance: Min guard Gait Distance (Feet): 40 Feet Assistive device: Rolling walker (2 wheels)   Gait velocity: decreased     General Gait Details: partial step through gait pattern   Stairs             Wheelchair Mobility    Modified Rankin (Stroke Patients Only)       Balance Overall balance assessment: Needs assistance Sitting-balance support: No upper extremity supported, Feet supported Sitting balance-Leahy Scale: Good Sitting balance - Comments: steady sitting reaching within BOS   Standing balance support: Bilateral upper extremity supported, During functional activity, Reliant on assistive device for balance Standing balance-Leahy Scale: Good Standing balance comment: steady ambulating with RW use                            Cognition Arousal/Alertness: Awake/alert Behavior During Therapy: WFL for tasks assessed/performed Overall Cognitive Status: Within Functional Limits for tasks assessed                                          Exercises      General Comments  Nursing cleared pt for participation in physical therapy.  Pt agreeable to PT session.      Pertinent Vitals/Pain Pain Assessment Pain Assessment: No/denies pain Vitals (HR and O2 on room air) stable and WFL throughout treatment session.    Home Living  Prior Function            PT Goals (current goals can now be found in the care plan section) Acute Rehab PT Goals Patient Stated Goal: to get stronger and return home PT Goal Formulation: With patient Time For Goal Achievement: 01/20/23 Potential to Achieve Goals: Good Progress towards PT goals: Progressing toward goals    Frequency    Min 3X/week      PT Plan Current plan remains appropriate     Co-evaluation              AM-PAC PT "6 Clicks" Mobility   Outcome Measure  Help needed turning from your back to your side while in a flat bed without using bedrails?: None Help needed moving from lying on your back to sitting on the side of a flat bed without using bedrails?: A Little Help needed moving to and from a bed to a chair (including a wheelchair)?: A Little Help needed standing up from a chair using your arms (e.g., wheelchair or bedside chair)?: A Little Help needed to walk in hospital room?: A Little Help needed climbing 3-5 steps with a railing? : A Lot 6 Click Score: 18    End of Session Equipment Utilized During Treatment: Gait belt Activity Tolerance: Patient limited by fatigue Patient left: in chair;with call bell/phone within reach;with chair alarm set Nurse Communication: Mobility status;Precautions;Other (comment) (pt's bed wet and pt needing clean-up/bath (pt requesting to eat breakfast and have her coffee first)) PT Visit Diagnosis: Muscle weakness (generalized) (M62.81);Difficulty in walking, not elsewhere classified (R26.2)     Time: 1610-9604 PT Time Calculation (min) (ACUTE ONLY): 14 min  Charges:  $Therapeutic Activity: 8-22 mins                     Hendricks Limes, PT 01/08/23, 9:18 AM

## 2023-01-09 DIAGNOSIS — I480 Paroxysmal atrial fibrillation: Secondary | ICD-10-CM | POA: Diagnosis not present

## 2023-01-09 DIAGNOSIS — I10 Essential (primary) hypertension: Secondary | ICD-10-CM | POA: Diagnosis not present

## 2023-01-09 DIAGNOSIS — K219 Gastro-esophageal reflux disease without esophagitis: Secondary | ICD-10-CM | POA: Diagnosis not present

## 2023-01-09 DIAGNOSIS — D649 Anemia, unspecified: Secondary | ICD-10-CM | POA: Diagnosis not present

## 2023-01-09 MED ORDER — SODIUM CHLORIDE 0.9 % IV SOLN
200.0000 mg | INTRAVENOUS | Status: AC
  Administered 2023-01-09: 200 mg via INTRAVENOUS
  Filled 2023-01-09: qty 200

## 2023-01-09 NOTE — Progress Notes (Signed)
Family member Loetta Rough notified staff of bed bug infestation at her home.

## 2023-01-09 NOTE — Progress Notes (Signed)
Palliative Care Progress Note, Assessment & Plan   Patient Name: Joan Ingram       Date: 01/09/2023 DOB: [birth year]  Age: 89 y.o. MRN#: [id number] Attending Physician: Enedina Finner, MD Primary Care Physician: Enid Baas, MD Admit Date: 01/03/2023  Subjective: Patient is out of bed and sitting in recliner.  She acknowledges my presence and is able to make her wishes known.  No family or friends present at bedside.  HPI: 89 y.o. female  with past medical history of HTN, iron deficiency anemia has received outpatient iron infusions through cancer center (last infusion Sept. 2023), paroxsymal atrial fibrillation on Xarelto, asthma and osteoporosis admitted from home on 01/03/2023 with increased shortness of breath for several days. Noted chronic lower extremity edema.   Work-up revealed profound anemia at 4.8 requiring blood transfusions. She reports only one episode of rectal bleeding several days prior to admission and reports a small amount of blood. Denies further bleeding episodes.   Acute anemia felt to be related to slow GI bleed in the setting of Xarelto use and underlying IDA. -Refuses GI work-up    Palliative medicine was consulted for assisting with goals of care conversations.  Summary of counseling/coordination of care: After reviewing the patient's chart and assessing the patient at bedside, I spoke with patient in regards to symptom management and plan of care.  Symptoms assessed.  Patient complains of nausea.  She denies urge to vomit.  She shares she felt nauseous prior to breakfast.  She states that she normally eats breakfast much earlier and feels that because she had an empty stomach for so long that her stomach started to turn.  She endorses nausea was not as severe after  eating breakfast.  However, she endorses she continues to feel a little "sick on her stomach".  Abdomen soft and nontender.  Patient denies diaphoresis, chest pain, diarrhea, and is afebrile at the moment.  Zofran as needed is available on MAR.  I advised RN to give dose of Zofran now.  Patient in agreement.  I discussed boundaries and goals of care with patient.  Patient again endorses a DNR.  We reviewed MOST form. Patient outlined her wishes for the following treatment decisions:  Cardiopulmonary Resuscitation: Do Not Attempt Resuscitation (DNR/No CPR)  Medical Interventions: Limited Additional Interventions: Use medical treatment, IV fluids and cardiac monitoring as indicated, DO NOT USE intubation or mechanical ventilation. May consider use of less invasive airway support such as BiPAP or CPAP. Also provide comfort measures. Transfer to the hospital if indicated. Avoid intensive care.   Antibiotics: Antibiotics if indicated  IV Fluids: No IV fluids (provide other measures to ensure comfort)  Feeding Tube: No feeding tube    MOST form placed in paper chart.  DNR [place] form also completed and left in paper chart.  Goals are clear.  Plan is set for DC to SNF.  TOC following closely for discharge planning.  PMT will continue to follow and support patient/family throughout her hospitalization.  Physical Exam Vitals reviewed.  Constitutional:      General: She is not in acute distress.    Appearance: She is normal weight.  HENT:     Head: Normocephalic.  Cardiovascular:  Rate and Rhythm: Tachycardia present.  Pulmonary:     Effort: Pulmonary effort is normal.  Abdominal:     Palpations: Abdomen is soft. There is no mass.     Tenderness: There is no abdominal tenderness.  Musculoskeletal:     Cervical back: Normal range of motion.  Skin:    General: Skin is warm and dry.  Neurological:     Mental Status: She is alert and oriented to person, place, and time.   Psychiatric:        Mood and Affect: Mood normal. Mood is not anxious.        Behavior: Behavior normal.             Total Time 50 minutes   [place] Marian L. Manon Hilding, FNP-BC Palliative Medicine Team Team Phone # [phone]

## 2023-01-09 NOTE — Progress Notes (Signed)
[facility]  - [place] [facility]   PATIENT NAME: Joan Ingram    MR#:  [id number]  DATE OF BIRTH:  09/15/1930  SUBJECTIVE:  no family at bedside.  Pt sitting in the chair Leg swelling improving. Has chronic leg edema/lymphedema  VITALS:  Blood pressure 110/72, pulse 90, temperature 97.9 F (36.6 C), temperature source Oral, resp. rate 16, height 5' (1.524 m), weight 64.2 kg, SpO2 96 %.  PHYSICAL EXAMINATION:   GENERAL:  89 y.o.-year-old patient with no acute distress. Pallor+ LUNGS: Normal breath sounds bilaterally, no wheezing CARDIOVASCULAR: S1, S2 normal. No murmur   ABDOMEN: Soft, nontender, nondistended. Bowel sounds present.  EXTREMITIES: ++ chronic edema b/l.    NEUROLOGIC: nonfocal  patient is alert and awake SKIN: No obvious rash, lesion, or ulcer.   LABORATORY PANEL:  CBC Recent Labs  Lab 01/04/23 0604 01/05/23 0619 01/05/23 1600  WBC 8.3  --   --   HGB 7.1*   < > 8.4*  HCT 21.8*  --  26.7*  PLT 288  --   --    < > = values in this interval not displayed.     Chemistries  Recent Labs  Lab 01/03/23 1852 01/04/23 0604  NA  --  129*  K  --  3.5  CL  --  97*  CO2  --  24  GLUCOSE  --  99  BUN  --  10  CREATININE  --  0.81  CALCIUM  --  8.3*  MG 1.9  --   AST  --  32  ALT  --  19  ALKPHOS  --  36*  BILITOT  --  2.3*     RADIOLOGY:  No results found.  Assessment and Plan  Joan Ingram is a 89 y.o. female with medical history significant of GERD, essential hypertension, iron deficiency anemia, paroxysmal atrial fibrillation on Xarelto, asthma and osteoporosis who was brought in from home with shortness of breath.  She does have chronic lower extremity edema.  She has found to have hemoglobin of 4.8. Received two units of blood transfusion hemoglobin is 7.1. Denies any rectal bleed other than one episode last Thursday which was very small amount per patient "it was smear". Patient denies any cough today. Denies any  fever.  Acute on chronic anemia-- suspect slow G.I. bleed in the setting of Xarelto history of iron deficiency anemia -- patient came in with hemoglobin of 4.8-- two unit blood transfusion 7.1--6.9--1 unit t--8.4 -- baseline hemoglobin 10.3 -- patient gets iron infusion said the cancer center last one was September 2023 -- she follows with Dr. Orlie Dakin. Spoke with Dr. Orlie Dakin and okay to transfuse IV iron. Plan for 5 doses -- Holding Xarelto -- patient is refusing any G.I. workup. Patient's stepdaughter [name] aware--GI consult cancelled -- Unable to tolerated oral iron  Abnormal chest x-ray with suspected right-sided patchy infiltrate Acute on Chronic HFpEF in the setting of anemia Chronic leg edema/;ymphedema-- clinically patient is not have cough or fever -- white count normal, pro calcitonin less than .10 -- clinically do not feel this is pneumonia --resumed torsemide--good diureses. Sats >92% on RA --leg edema improving  UTI/abnormal UA -- patient did not have any specific symptoms how were given bacterial urea and abnormal UA will treat with IV Rocephin three doses  History of hiatal hernia/Gerd -- PO PPI  H/o PAF --spoke with [facility] cardiology [place]--informed about pt and holding xarelto for now --pt will see dr Cassie Freer after  discharge  --dter aware pt is off Xarelto for now  Hyponatremia/hypokalemia --126--improved to 129 --hold torsemide  --K repleted -- resume torsemide  Generalized weakness and deconditioning. -- Patient seen by PT OT. She appears by deconditioned. She'll benefit from rehab for few days. Discussed with patient's daughter and informed her. Will have TOC work on discharge planning  Palliative care consultation appreciated patient is DNR/DNI  Family communication : spoke with Melvenia Needles Consults :none CODE STATUS: DNR/DNI DVT Prophylaxis :SCD (anemia) Level of care: Med-Surg Status is: Inpatient Remains inpatient appropriate because: Anemia   TOC for d/c planning  TOTAL TIME TAKING CARE OF THIS PATIENT: 35 minutes.  >50% time spent on counselling and coordination of care  Note: This dictation was prepared with Dragon dictation along with smaller phrase technology. Any transcriptional errors that result from this process are unintentional.  Enedina Finner M.D    [facility]   CC: Primary care physician; Enid Baas, MD

## 2023-01-09 NOTE — TOC Progression Note (Signed)
Transition of Care [facility]) - Progression Note    Patient Details  Name: Joan Ingram MRN: [id number] Date of Birth: 10-26-1929  [facility] CM/SW Contact  Chapman Fitch, RN Phone Number: 01/09/2023, 4:30 PM  Clinical Narrative:     Received notification from [place] at [facility]. Patient has been approved for [facility] for 7 days.  Auth number [id number]       Expected Discharge Plan and Services                                               Social Determinants of Health (SDOH) Interventions SDOH Screenings   Food Insecurity: No Food Insecurity (01/04/2023)  Housing: Low Risk  (01/04/2023)  Transportation Needs: No Transportation Needs (01/04/2023)  Utilities: Not At Risk (01/04/2023)  Tobacco Use: Low Risk  (01/04/2023)    Readmission Risk Interventions     No data to display

## 2023-01-09 NOTE — TOC Progression Note (Signed)
Transition of Care [facility]) - Progression Note    Patient Details  Name: Joan Ingram MRN: [id number] Date of Birth: 1930-09-12  Transition of Care [facility]) CM/SW Contact  Chapman Fitch, RN Phone Number: 01/09/2023, 9:56 AM  Clinical Narrative:      Insurance auth still pending for SNF       Discharge Plan and Services                                               Social Determinants of Health (SDOH) Interventions SDOH Screenings   Food Insecurity: No Food Insecurity (01/04/2023)  Housing: Low Risk  (01/04/2023)  Transportation Needs: No Transportation Needs (01/04/2023)  Utilities: Not At Risk (01/04/2023)  Tobacco Use: Low Risk  (01/04/2023)    Readmission Risk Interventions     No data to display

## 2023-01-09 NOTE — Progress Notes (Signed)
Occupational Therapy Treatment Patient Details Name: Joan Ingram MRN: [id number] DOB: 1929-10-02 Today's Date: 01/09/2023   History of present illness presented to ER secondary to SOB, LE edema; admitted for management of symptomatic anemia (admitting HgB 4.8, now corrected) due to GIB   OT comments  Patient received sitting up in recliner and agreeable to OT. Pt endorsed her gown and chuck pad feeling "wet." Pt's purewick canister found to be full and no longer suctioning. Pt was able to stand from recliner with CGA-Min A and complete step pivot transfer from recliner>EOB with CGA using a RW. Pt then engaged in seated grooming and UB dressing at EOB. Pt was left semi-reclined in bed with all needs in reach. BLEs elevated on pillow. Pt is making progress toward goal completion. D/C recommendation remains appropriate. OT will continue to follow acutely.    Recommendations for follow up therapy are one component of a multi-disciplinary discharge planning process, led by the attending physician.  Recommendations may be updated based on patient status, additional functional criteria and insurance authorization.    Assistance Recommended at Discharge Intermittent Supervision/Assistance  Patient can return home with the following  A little help with walking and/or transfers;A lot of help with bathing/dressing/bathroom;Help with stairs or ramp for entrance   Equipment Recommendations  BSC/3in1    Recommendations for Other Services      Precautions / Restrictions Precautions Precautions: Fall Restrictions Weight Bearing Restrictions: No       Mobility Bed Mobility Overal bed mobility: Needs Assistance Bed Mobility: Sit to Supine       Sit to supine: Min assist (assist for BLE management)        Transfers Overall transfer level: Needs assistance Equipment used: Rolling walker (2 wheels) Transfers: Sit to/from Stand, Bed to chair/wheelchair/BSC Sit to Stand: Min guard, Min  assist (from recliner)     Step pivot transfers: Min guard (from recliner>EOB)     General transfer comment: VC for hand placement     Balance Overall balance assessment: Needs assistance Sitting-balance support: No upper extremity supported, Feet supported Sitting balance-Leahy Scale: Good     Standing balance support: Bilateral upper extremity supported, During functional activity Standing balance-Leahy Scale: Good       ADL either performed or assessed with clinical judgement   ADL Overall ADL's : Needs assistance/impaired     Grooming: Set up;Wash/dry face;Sitting           Upper Body Dressing : Minimal assistance;Sitting Upper Body Dressing Details (indicate cue type and reason): to don/doff gown Lower Body Dressing: Maximal assistance;Bed level Lower Body Dressing Details (indicate cue type and reason): to doff B socks once back in bed Toilet Transfer: Min guard;Rolling walker (2 wheels) Toilet Transfer Details (indicate cue type and reason): simulated                Extremity/Trunk Assessment Upper Extremity Assessment Upper Extremity Assessment: Generalized weakness   Lower Extremity Assessment Lower Extremity Assessment: Generalized weakness        Vision Baseline Vision/History: 1 Wears glasses Patient Visual Report: No change from baseline     Perception     Praxis      Cognition Arousal/Alertness: Awake/alert Behavior During Therapy: WFL for tasks assessed/performed Overall Cognitive Status: Within Functional Limits for tasks assessed          Exercises      Shoulder Instructions       General Comments      Pertinent Vitals/ Pain  Pain Assessment Pain Assessment: No/denies pain  Home Living              Prior Functioning/Environment              Frequency  Min 3X/week        Progress Toward Goals  OT Goals(current goals can now be found in the care plan section)  Progress towards OT goals:  Progressing toward goals  Acute Rehab OT Goals Patient Stated Goal: to walk OT Goal Formulation: With patient/family Time For Goal Achievement: 01/20/23 Potential to Achieve Goals: Good  Plan Discharge plan remains appropriate;Frequency remains appropriate    Co-evaluation                 AM-PAC OT "6 Clicks" Daily Activity     Outcome Measure   Help from another person eating meals?: None Help from another person taking care of personal grooming?: A Little Help from another person toileting, which includes using toliet, bedpan, or urinal?: A Lot Help from another person bathing (including washing, rinsing, drying)?: A Lot Help from another person to put on and taking off regular upper body clothing?: A Little Help from another person to put on and taking off regular lower body clothing?: A Lot 6 Click Score: 16    End of Session Equipment Utilized During Treatment: Gait belt;Rolling walker (2 wheels)  OT Visit Diagnosis: Unsteadiness on feet (R26.81);Muscle weakness (generalized) (M62.81)   Activity Tolerance Patient tolerated treatment well   Patient Left in bed;with call bell/phone within reach;with bed alarm set   Nurse Communication Mobility status;Other (comment) (NT notified that pt's purewick canister full and no longer suctioning)        Time: 4098-1191 OT Time Calculation (min): 15 min  Charges: OT General Charges $OT Visit: 1 Visit OT Treatments $Self Care/Home Management : 8-22 mins  [facility] MS, OTR/L ascom [phone]  01/09/23, 5:33 PM

## 2023-01-10 ENCOUNTER — Inpatient Hospital Stay: Payer: PPO

## 2023-01-10 DIAGNOSIS — I10 Essential (primary) hypertension: Secondary | ICD-10-CM | POA: Diagnosis not present

## 2023-01-10 DIAGNOSIS — E559 Vitamin D deficiency, unspecified: Secondary | ICD-10-CM | POA: Diagnosis not present

## 2023-01-10 DIAGNOSIS — I5033 Acute on chronic diastolic (congestive) heart failure: Secondary | ICD-10-CM | POA: Diagnosis not present

## 2023-01-10 DIAGNOSIS — K219 Gastro-esophageal reflux disease without esophagitis: Secondary | ICD-10-CM | POA: Diagnosis not present

## 2023-01-10 DIAGNOSIS — N390 Urinary tract infection, site not specified: Secondary | ICD-10-CM | POA: Diagnosis not present

## 2023-01-10 DIAGNOSIS — R079 Chest pain, unspecified: Secondary | ICD-10-CM | POA: Diagnosis not present

## 2023-01-10 DIAGNOSIS — D649 Anemia, unspecified: Secondary | ICD-10-CM | POA: Diagnosis not present

## 2023-01-10 DIAGNOSIS — Z736 Limitation of activities due to disability: Secondary | ICD-10-CM | POA: Diagnosis not present

## 2023-01-10 DIAGNOSIS — M6259 Muscle wasting and atrophy, not elsewhere classified, multiple sites: Secondary | ICD-10-CM | POA: Diagnosis not present

## 2023-01-10 DIAGNOSIS — R2681 Unsteadiness on feet: Secondary | ICD-10-CM | POA: Diagnosis not present

## 2023-01-10 DIAGNOSIS — E119 Type 2 diabetes mellitus without complications: Secondary | ICD-10-CM | POA: Diagnosis not present

## 2023-01-10 DIAGNOSIS — Z79899 Other long term (current) drug therapy: Secondary | ICD-10-CM | POA: Diagnosis not present

## 2023-01-10 DIAGNOSIS — R770 Abnormality of albumin: Secondary | ICD-10-CM | POA: Diagnosis not present

## 2023-01-10 DIAGNOSIS — J90 Pleural effusion, not elsewhere classified: Secondary | ICD-10-CM | POA: Diagnosis not present

## 2023-01-10 DIAGNOSIS — I480 Paroxysmal atrial fibrillation: Secondary | ICD-10-CM | POA: Diagnosis not present

## 2023-01-10 DIAGNOSIS — R41841 Cognitive communication deficit: Secondary | ICD-10-CM | POA: Diagnosis not present

## 2023-01-10 DIAGNOSIS — K449 Diaphragmatic hernia without obstruction or gangrene: Secondary | ICD-10-CM | POA: Diagnosis not present

## 2023-01-10 DIAGNOSIS — I5032 Chronic diastolic (congestive) heart failure: Secondary | ICD-10-CM | POA: Diagnosis not present

## 2023-01-10 LAB — TROPONIN I (HIGH SENSITIVITY)
Troponin I (High Sensitivity): 28 ng/L — ABNORMAL HIGH (ref <18)
Troponin I (High Sensitivity): 29 ng/L — ABNORMAL HIGH (ref <18)

## 2023-01-10 MED ORDER — NITROGLYCERIN 0.4 MG SL SUBL
0.4000 mg | SUBLINGUAL_TABLET | SUBLINGUAL | Status: DC | PRN
  Administered 2023-01-10 (×3): 0.4 mg via SUBLINGUAL
  Filled 2023-01-10 (×3): qty 1

## 2023-01-10 MED ORDER — LIDOCAINE VISCOUS HCL 2 % MT SOLN
15.0000 mL | Freq: Once | OROMUCOSAL | Status: AC
  Administered 2023-01-10: 15 mL via OROMUCOSAL
  Filled 2023-01-10: qty 15

## 2023-01-10 MED ORDER — ALUM & MAG HYDROXIDE-SIMETH 200-200-20 MG/5ML PO SUSP
15.0000 mL | Freq: Once | ORAL | Status: AC
  Administered 2023-01-10: 15 mL via ORAL
  Filled 2023-01-10: qty 30

## 2023-01-10 MED ORDER — ALUM & MAG HYDROXIDE-SIMETH 200-200-20 MG/5ML PO SUSP: 30.0000 mL | ORAL | 0 refills | Status: AC | PRN

## 2023-01-10 MED ORDER — SUCRALFATE 1 GM/10ML PO SUSP
1.0000 g | Freq: Once | ORAL | Status: AC
  Administered 2023-01-10: 1 g via ORAL
  Filled 2023-01-10: qty 10

## 2023-01-10 MED ORDER — PANTOPRAZOLE SODIUM 40 MG PO TBEC: 40.0000 mg | DELAYED_RELEASE_TABLET | Freq: Two times a day (BID) | ORAL | Status: AC

## 2023-01-10 NOTE — Progress Notes (Addendum)
0102 pt alerted RN to having chest pain radiating up into the neck, rating pain a 7/10.  Vs stable 97.9 temp, 117/69 bp, 91 hr, and 98% on room air.  0306 NP was paged, orders placed were nitro 0.4mg  sublingual x3 5 mins apart, STAT EKG, and STAT troponin.    0316 nitro was given 0321 EKG done 0323 lab tech drawing trop level  0329 pt reports feeling a little better but still having chest pain 0330 second nitro given 0337 pt states chest pain is the same  0338 third nitro given  0340 portable chest xray being done  0348 maalox, lidocaine and sucralfate given   0358 pt reports feeling "loads better", care is ongoing

## 2023-01-10 NOTE — Discharge Summary (Addendum)
Physician Discharge Summary  Joan Ingram:[id number] DOB: 1930-02-13 DOA: 01/03/2023  PCP: Enid Baas, MD  Admit date: 01/03/2023 Discharge date: 01/10/2023  Admitted From: Home Disposition:  [facility]  Recommendations for Outpatient Follow-up:  Follow up with PCP in 1-2 weeks Please obtain BMP/CBC in one week your next doctors visit.  Hold Xarelto until seen by outpatient cardiology   Discharge Condition: Stable CODE STATUS: DNR Diet recommendation: Cardiac  Brief/Interim Summary: 89 year old female with history of GERD, essential hypertension, iron deficiency anemia, paroxysmal A-fib on Xarelto, asthma, osteoporosis admitted for shortness of breath. Patient was found to have symptomatic anemia with hemoglobin of 4.8 requiring PRBC transfusion. Patient refused GI workup. Hematology recommended IV transfusion. Patient was also seen by palliative care service, she is DNR/DN  Today medically stable for discharge.   Acute on chronic anemia-- suspect slow G.I. bleed in the setting of Xarelto history of iron deficiency anemia Initially hemoglobin was 4.8, requiring 2 units PRBC transfusion.  Hemoglobin is now stable, patient is refusing GI workup.  Continue to hold Xarelto.  Previous provider spoke with Dr. Orlie Dakin from hematology who recommended IV iron, unable to do p.o.   Abnormal chest x-ray with suspected right-sided patchy infiltrate Acute on Chronic HFpEF in the setting of anemia Chronic leg edema/;ymphedema-- clinically patient is not have cough or fever Suspect from fluid overload, torsemide resumed   UTI/abnormal UA -Nonspecific symptoms, treated with 3 days of Rocephin 4/17-4/19   History of hiatal hernia/Gerd PPI twice daily   History of paroxysmal A-fib Previous provider discussed with [facility] cardiology.  Plan is to continue to hold Xarelto until followed up outpatient cardiology   Hyponatremia/hypokalemia Improved, monitor   Generalized weakness and  deconditioning. PT has recommended SNF   Palliative care consultation appreciated patient is DNR/DNI MOST form placed in chart by palliative care  Discharge Diagnoses:  Principal Problem:   Symptomatic anemia Active Problems:   Chronic heart failure with preserved ejection fraction   Moderate pulmonary hypertension   Paroxysmal atrial fibrillation   Benign essential hypertension   Esophageal reflux   Hyponatremia   Hypokalemia   Anemia   Urinary tract infection without hematuria      Consultations: Palliative  Subjective: Doing okay no complaints  Discharge Exam: Vitals:   01/10/23 0306 01/10/23 0740  BP: 117/68 (!) 121/57  Pulse: 91 87  Resp: 17 14  Temp: 97.9 F (36.6 C) 97.7 F (36.5 C)  SpO2: 98% 94%   Vitals:   01/09/23 0709 01/09/23 1849 01/10/23 0306 01/10/23 0740  BP: 110/72 111/67 117/68 (!) 121/57  Pulse: 90 96 91 87  Resp: 16 18 17 14   Temp: 97.9 F (36.6 C) 97.8 F (36.6 C) 97.9 F (36.6 C) 97.7 F (36.5 C)  TempSrc: Oral Oral Oral Oral  SpO2: 96% 94% 98% 94%  Weight:      Height:        General: Pt is alert, awake, not in acute distress Cardiovascular: RRR, S1/S2 +, no rubs, no gallops Respiratory: CTA bilaterally, no wheezing, no rhonchi Abdominal: Soft, NT, ND, bowel sounds + Extremities: Chronic edema in bilateral lower extremities  Discharge Instructions   Allergies as of 01/10/2023       Reactions   Other Anaphylaxis   Aspirin    Astemizole Other (See Comments)   Benadryl [diphenhydramine Hcl]    Ciprofloxacin    Codeine    Doxycycline    Fexofenadine Other (See Comments)   Lansoprazole Diarrhea   Levofloxacin Swelling   Loratadine  Other (See Comments)   Macrobid [nitrofurantoin Macrocrystal]    Morphine And Related    Omeprazole Diarrhea   Oxycodone    Pantoprazole Diarrhea   Penicillins    Rabeprazole Diarrhea   Terfenadine Swelling        Medication List     STOP taking these medications     metroNIDAZOLE 500 MG tablet Commonly known as: FLAGYL   rivaroxaban 20 MG Tabs tablet Commonly known as: XARELTO       TAKE these medications    albuterol 108 (90 Base) MCG/ACT inhaler Commonly known as: VENTOLIN HFA Inhale 2 puffs into the lungs every 6 (six) hours as needed for wheezing or shortness of breath.   alum & mag hydroxide-simeth 200-200-20 MG/5ML suspension Commonly known as: MAALOX/MYLANTA Take 30 mLs by mouth every 4 (four) hours as needed for indigestion or heartburn.   fluticasone-salmeterol 250-50 MCG/ACT Aepb Commonly known as: ADVAIR Inhale 1 puff into the lungs every 12 (twelve) hours.   ondansetron 4 MG disintegrating tablet Commonly known as: ZOFRAN-ODT Take 1 tablet (4 mg total) by mouth every 8 (eight) hours as needed for nausea or vomiting.   pantoprazole 40 MG tablet Commonly known as: PROTONIX Take 1 tablet (40 mg total) by mouth 2 (two) times daily before a meal.   torsemide 20 MG tablet Commonly known as: DEMADEX Take 20 mg by mouth daily.        Contact information for follow-up providers     Paraschos, Alexander, MD. Go in 1 week(s).   Specialty: Cardiology Contact information: [address] [facility] [place] [address] [phone]         Enid Baas, MD Follow up in 1 week(s).   Specialty: Internal Medicine Contact information: [address] [place] [address] [phone]              Contact information for after-discharge care     Destination     [facility] [address] [place] [facility] Preferred SNF .   Service: Skilled Nursing Contact information: [address] [place] [address] [phone]                    Allergies  Allergen Reactions   Other Anaphylaxis   Aspirin    Astemizole Other (See Comments)   Benadryl [Diphenhydramine Hcl]    Ciprofloxacin    Codeine    Doxycycline    Fexofenadine Other (See Comments)    Lansoprazole Diarrhea   Levofloxacin Swelling   Loratadine Other (See Comments)   Macrobid [Nitrofurantoin Macrocrystal]    Morphine And Related    Omeprazole Diarrhea   Oxycodone    Pantoprazole Diarrhea   Penicillins    Rabeprazole Diarrhea   Terfenadine Swelling    You were cared for by a hospitalist during your hospital stay. If you have any questions about your discharge medications or the care you received while you were in the hospital after you are discharged, you can call the unit and asked to speak with the hospitalist on call if the hospitalist that took care of you is not available. Once you are discharged, your primary care physician will handle any further medical issues. Please note that no refills for any discharge medications will be authorized once you are discharged, as it is imperative that you return to your primary care physician (or establish a relationship with a primary care physician if you do not have one) for your aftercare needs so that they can reassess  your need for medications and monitor your lab values.  You were cared for by a hospitalist during your hospital stay. If you have any questions about your discharge medications or the care you received while you were in the hospital after you are discharged, you can call the unit and asked to speak with the hospitalist on call if the hospitalist that took care of you is not available. Once you are discharged, your primary care physician will handle any further medical issues. Please note that NO REFILLS for any discharge medications will be authorized once you are discharged, as it is imperative that you return to your primary care physician (or establish a relationship with a primary care physician if you do not have one) for your aftercare needs so that they can reassess your need for medications and monitor your lab values.  Please request your Prim.MD to go over all Hospital Tests and Procedure/Radiological results  at the follow up, please get all Hospital records sent to your Prim MD by signing hospital release before you go home.  Get CBC, CMP, 2 view Chest X ray checked  by Primary MD during your next visit or SNF MD in 5-7 days ( we routinely change or add medications that can affect your baseline labs and fluid status, therefore we recommend that you get the mentioned basic workup next visit with your PCP, your PCP may decide not to get them or add new tests based on their clinical decision)  On your next visit with your primary care physician please Get Medicines reviewed and adjusted.  If you experience worsening of your admission symptoms, develop shortness of breath, life threatening emergency, suicidal or homicidal thoughts you must seek medical attention immediately by calling 911 or calling your MD immediately  if symptoms less severe.  You Must read complete instructions/literature along with all the possible adverse reactions/side effects for all the Medicines you take and that have been prescribed to you. Take any new Medicines after you have completely understood and accpet all the possible adverse reactions/side effects.   Do not drive, operate heavy machinery, perform activities at heights, swimming or participation in water activities or provide baby sitting services if your were admitted for syncope or siezures until you have seen by Primary MD or a Neurologist and advised to do so again.  Do not drive when taking Pain medications.   Procedures/Studies: DG Chest Port 1 View  Result Date: 01/10/2023 CLINICAL DATA:  Chest pain EXAM: PORTABLE CHEST 1 VIEW COMPARISON:  Seven days ago FINDINGS: Hyperinflation. Small bilateral pleural effusion and interstitial opacity with some Kerley lines. There is chronic cardiomegaly and sizable hiatal hernia. IMPRESSION: 1. Interstitial edema and small pleural effusions. 2. Chronic hyperinflation. Electronically Signed   By: Tiburcio Pea M.D.   On:  01/10/2023 03:56   CT Head Wo Contrast  Result Date: 01/03/2023 CLINICAL DATA:  Altered mental status EXAM: CT HEAD WITHOUT CONTRAST TECHNIQUE: Contiguous axial images were obtained from the base of the skull through the vertex without intravenous contrast. RADIATION DOSE REDUCTION: This exam was performed according to the departmental dose-optimization program which includes automated exposure control, adjustment of the mA and/or kV according to patient size and/or use of iterative reconstruction technique. COMPARISON:  None Available. FINDINGS: Brain: No evidence of acute infarction, hemorrhage, hydrocephalus, extra-axial collection or mass lesion/mass effect. Subcortical white matter and periventricular small vessel ischemic changes. Vascular: Intracranial atherosclerosis. Skull: Normal. Negative for fracture or focal lesion. Sinuses/Orbits: The visualized paranasal sinuses are  essentially clear. The mastoid air cells are unopacified. Other: None. IMPRESSION: No acute intracranial abnormality. Small vessel ischemic changes. Electronically Signed   By: Charline Bills M.D.   On: 01/03/2023 20:41   DG Chest Port 1 View  Result Date: 01/03/2023 CLINICAL DATA:  Questionable sepsis EXAM: PORTABLE CHEST 1 VIEW COMPARISON:  Chest x-ray dated July 24, 2022 FINDINGS: Unchanged cardiomegaly. Large hiatal hernia. Eventration of the right hemidiaphragm. Small left pleural effusion. New nodular opacities of the right hemithorax. No evidence of pneumothorax. IMPRESSION: 1. New nodular opacities of the right hemithorax, concerning for infection. Recommend follow-up PA and lateral chest x-ray in 6-8 weeks to ensure resolution. 2. Small left pleural effusion. Electronically Signed   By: Allegra Lai M.D.   On: 01/03/2023 19:37     The results of significant diagnostics from this hospitalization (including imaging, microbiology, ancillary and laboratory) are listed below for reference.      Microbiology: Recent Results (from the past 240 hour(s))  Resp panel by RT-PCR (RSV, Flu A&B, Covid) Anterior Nasal Swab     Status: None   Collection Time: 01/03/23  7:36 PM   Specimen: Anterior Nasal Swab  Result Value Ref Range Status   SARS Coronavirus 2 by RT PCR NEGATIVE NEGATIVE Final    Comment: (NOTE) SARS-CoV-2 target nucleic acids are NOT DETECTED.  The SARS-CoV-2 RNA is generally detectable in upper respiratory specimens during the acute phase of infection. The lowest concentration of SARS-CoV-2 viral copies this assay can detect is 138 copies/mL. A negative result does not preclude SARS-Cov-2 infection and should not be used as the sole basis for treatment or other patient management decisions. A negative result may occur with  improper specimen collection/handling, submission of specimen other than nasopharyngeal swab, presence of viral mutation(s) within the areas targeted by this assay, and inadequate number of viral copies(<138 copies/mL). A negative result must be combined with clinical observations, patient history, and epidemiological information. The expected result is Negative.  Fact Sheet for Patients:  BloggerCourse.com  Fact Sheet for Healthcare Providers:  SeriousBroker.it  This test is no t yet approved or cleared by the [place] FDA and  has been authorized for detection and/or diagnosis of SARS-CoV-2 by FDA under an Emergency Use Authorization (EUA). This EUA will remain  in effect (meaning this test can be used) for the duration of the COVID-19 declaration under Section 564(b)(1) of the Act, 21 U.S.C.section 360bbb-3(b)(1), unless the authorization is terminated  or revoked sooner.       Influenza A by PCR NEGATIVE NEGATIVE Final   Influenza B by PCR NEGATIVE NEGATIVE Final    Comment: (NOTE) The Xpert Xpress SARS-CoV-2/FLU/RSV plus assay is intended as an aid in the diagnosis of  influenza from Nasopharyngeal swab specimens and should not be used as a sole basis for treatment. Nasal washings and aspirates are unacceptable for Xpert Xpress SARS-CoV-2/FLU/RSV testing.  Fact Sheet for Patients: BloggerCourse.com  Fact Sheet for Healthcare Providers: SeriousBroker.it  This test is not yet approved or cleared by the [place] FDA and has been authorized for detection and/or diagnosis of SARS-CoV-2 by FDA under an Emergency Use Authorization (EUA). This EUA will remain in effect (meaning this test can be used) for the duration of the COVID-19 declaration under Section 564(b)(1) of the Act, 21 U.S.C. section 360bbb-3(b)(1), unless the authorization is terminated or revoked.     Resp Syncytial Virus by PCR NEGATIVE NEGATIVE Final    Comment: (NOTE) Fact Sheet for Patients: BloggerCourse.com  Fact  Sheet for Healthcare Providers: SeriousBroker.it  This test is not yet approved or cleared by the [place] and has been authorized for detection and/or diagnosis of SARS-CoV-2 by FDA under an Emergency Use Authorization (EUA). This EUA will remain in effect (meaning this test can be used) for the duration of the COVID-19 declaration under Section 564(b)(1) of the Act, 21 U.S.C. section 360bbb-3(b)(1), unless the authorization is terminated or revoked.  Performed at [facility], [address] [place], [place] [address]      Labs: BNP (last 3 results) Recent Labs    07/24/22 0515  BNP 176.4*   Basic Metabolic Panel: Recent Labs  Lab 01/03/23 1851 01/03/23 1852 01/04/23 0604  NA 126*  --  129*  K 2.9*  --  3.5  CL 92*  --  97*  CO2 26  --  24  GLUCOSE 136*  --  99  BUN 12  --  10  CREATININE 0.92  --  0.81  CALCIUM 8.6*  --  8.3*  MG  --  1.9  --    Liver Function Tests: Recent Labs  Lab 01/03/23 1851 01/04/23 0604  AST  35 32  ALT 19 19  ALKPHOS 41 36*  BILITOT 1.1 2.3*  PROT 6.1* 5.9*  ALBUMIN 3.6 3.2*   No results for input(s): "LIPASE", "AMYLASE" in the last 168 hours. No results for input(s): "AMMONIA" in the last 168 hours. CBC: Recent Labs  Lab 01/03/23 1851 01/04/23 0604 01/05/23 0619 01/05/23 1600  WBC 6.2 8.3  --   --   NEUTROABS 4.5  --   --   --   HGB 4.8* 7.1* 6.9* 8.4*  HCT 15.7* 21.8*  --  26.7*  MCV 80.9 82.0  --   --   PLT 373 288  --   --    Cardiac Enzymes: No results for input(s): "CKTOTAL", "CKMB", "CKMBINDEX", "TROPONINI" in the last 168 hours. BNP: Invalid input(s): "POCBNP" CBG: No results for input(s): "GLUCAP" in the last 168 hours. D-Dimer No results for input(s): "DDIMER" in the last 72 hours. Hgb A1c No results for input(s): "HGBA1C" in the last 72 hours. Lipid Profile No results for input(s): "CHOL", "HDL", "LDLCALC", "TRIG", "CHOLHDL", "LDLDIRECT" in the last 72 hours. Thyroid function studies No results for input(s): "TSH", "T4TOTAL", "T3FREE", "THYROIDAB" in the last 72 hours.  Invalid input(s): "FREET3" Anemia work up No results for input(s): "VITAMINB12", "FOLATE", "FERRITIN", "TIBC", "IRON", "RETICCTPCT" in the last 72 hours. Urinalysis    Component Value Date/Time   COLORURINE YELLOW (A) 01/04/2023 0018   APPEARANCEUR HAZY (A) 01/04/2023 0018   APPEARANCEUR Hazy 11/16/2013 1150   LABSPEC 1.003 (L) 01/04/2023 0018   LABSPEC 1.012 11/16/2013 1150   PHURINE 6.0 01/04/2023 0018   GLUCOSEU NEGATIVE 01/04/2023 0018   GLUCOSEU Negative 11/16/2013 1150   HGBUR NEGATIVE 01/04/2023 0018   BILIRUBINUR NEGATIVE 01/04/2023 0018   BILIRUBINUR Negative 11/16/2013 1150   KETONESUR 5 (A) 01/04/2023 0018   PROTEINUR NEGATIVE 01/04/2023 0018   NITRITE NEGATIVE 01/04/2023 0018   LEUKOCYTESUR MODERATE (A) 01/04/2023 0018   LEUKOCYTESUR 3+ 11/16/2013 1150   Sepsis Labs Recent Labs  Lab 01/03/23 1851 01/04/23 0604  WBC 6.2 8.3   Microbiology Recent  Results (from the past 240 hour(s))  Resp panel by RT-PCR (RSV, Flu A&B, Covid) Anterior Nasal Swab     Status: None   Collection Time: 01/03/23  7:36 PM   Specimen: Anterior Nasal Swab  Result Value Ref Range Status  SARS Coronavirus 2 by RT PCR NEGATIVE NEGATIVE Final    Comment: (NOTE) SARS-CoV-2 target nucleic acids are NOT DETECTED.  The SARS-CoV-2 RNA is generally detectable in upper respiratory specimens during the acute phase of infection. The lowest concentration of SARS-CoV-2 viral copies this assay can detect is 138 copies/mL. A negative result does not preclude SARS-Cov-2 infection and should not be used as the sole basis for treatment or other patient management decisions. A negative result may occur with  improper specimen collection/handling, submission of specimen other than nasopharyngeal swab, presence of viral mutation(s) within the areas targeted by this assay, and inadequate number of viral copies(<138 copies/mL). A negative result must be combined with clinical observations, patient history, and epidemiological information. The expected result is Negative.  Fact Sheet for Patients:  BloggerCourse.com  Fact Sheet for Healthcare Providers:  SeriousBroker.it  This test is no t yet approved or cleared by the [place] FDA and  has been authorized for detection and/or diagnosis of SARS-CoV-2 by FDA under an Emergency Use Authorization (EUA). This EUA will remain  in effect (meaning this test can be used) for the duration of the COVID-19 declaration under Section 564(b)(1) of the Act, 21 U.S.C.section 360bbb-3(b)(1), unless the authorization is terminated  or revoked sooner.       Influenza A by PCR NEGATIVE NEGATIVE Final   Influenza B by PCR NEGATIVE NEGATIVE Final    Comment: (NOTE) The Xpert Xpress SARS-CoV-2/FLU/RSV plus assay is intended as an aid in the diagnosis of influenza from Nasopharyngeal  swab specimens and should not be used as a sole basis for treatment. Nasal washings and aspirates are unacceptable for Xpert Xpress SARS-CoV-2/FLU/RSV testing.  Fact Sheet for Patients: BloggerCourse.com  Fact Sheet for Healthcare Providers: SeriousBroker.it  This test is not yet approved or cleared by the [place] FDA and has been authorized for detection and/or diagnosis of SARS-CoV-2 by FDA under an Emergency Use Authorization (EUA). This EUA will remain in effect (meaning this test can be used) for the duration of the COVID-19 declaration under Section 564(b)(1) of the Act, 21 U.S.C. section 360bbb-3(b)(1), unless the authorization is terminated or revoked.     Resp Syncytial Virus by PCR NEGATIVE NEGATIVE Final    Comment: (NOTE) Fact Sheet for Patients: BloggerCourse.com  Fact Sheet for Healthcare Providers: SeriousBroker.it  This test is not yet approved or cleared by the [place] FDA and has been authorized for detection and/or diagnosis of SARS-CoV-2 by FDA under an Emergency Use Authorization (EUA). This EUA will remain in effect (meaning this test can be used) for the duration of the COVID-19 declaration under Section 564(b)(1) of the Act, 21 U.S.C. section 360bbb-3(b)(1), unless the authorization is terminated or revoked.  Performed at [facility], [address] [place], [place] [address]      Time coordinating discharge:  I have spent 35 minutes face to face with the patient and on the ward discussing the patients care, assessment, plan and disposition with other care givers. >50% of the time was devoted counseling the patient about the risks and benefits of treatment/Discharge disposition and coordinating care.   SIGNED:   Dimple Nanas, MD  [facility] 01/10/2023, 9:24 AM   If 7PM-7AM, please contact night-coverage

## 2023-01-10 NOTE — Progress Notes (Signed)
       CROSS COVER NOTE  NAME: Joan Ingram MRN: [id number] DOB : 07-16-1930 ATTENDING PHYSICIAN: Enedina Finner, MD    Date of Service   01/10/2023   HPI/Events of Note   Tonight M(r)s Ensz is reporting 7/10  mid chest pain described as burning. She denies aggravating or alleviating factors and reports pain began at rest. The pain is not reproducible on palpation. She reports the pain "is not severe, it's been worse, I have heart failure".  Denies dyspnea, fatigue, palpitations, dizziness, abdominal pain, nausea, vomiting, radiation of pain.  Chest pain not responsive to SL nitro. Pain did resolve after GI cocktail   Interventions   Assessment/Plan:  EKG SL Nitro Troponin 28--> GI Cocktail CXR       To reach the provider On-Call:   7AM- 7PM see care teams to locate the attending and reach out to them via [email or web address] Password: [email or web address] 7PM-7AM contact night-coverage If you still have difficulty reaching the appropriate provider, please page the [facility] (Director on Call) for [facility] on amion for assistance  This document was prepared using Conservation officer, historic buildings and may include unintentional dictation errors.  Bishop Limbo DNP, MBA, FNP-BC, PMHNP-BC Nurse Practitioner [facility] Pager [phone]

## 2023-01-10 NOTE — Progress Notes (Signed)
Patient is being wheeled to the medical mall exit by volunteers for discharge to the care of family in a private vehicle. Patient is in stable condition.   Cornell Barman Anni Hocevar

## 2023-01-10 NOTE — Progress Notes (Signed)
Report called to [facility]. IV removed. Ex cath removed. Awaiting arrival of daughter. Students assisting patient to dress.  Cornell Barman Gracielynn Birkel

## 2023-01-10 NOTE — TOC Transition Note (Signed)
Transition of Care [facility]) - CM/SW Discharge Note   Patient Details  Name: Joan Ingram MRN: [id number] Date of Birth: April 21, 1930  Transition of Care [facility] CM/SW Contact:  Chapman Fitch, RN Phone Number: 01/10/2023, 9:33 AM   Clinical Narrative:   .  Patient will DC FA:OZHY  Anticipated DC date:01/10/23  Family notified: granddaughter Control and instrumentation engineer by: family member Sports administrator  Per MD patient ready for DC to . RN, patient, patient's family, and facility notified of DC. Discharge Summary sent to facility. RN given number for report. DC packet on chart.   TOC signing off.  [name] [facility] [phone]        Patient Goals and CMS Choice      Discharge Placement                         Discharge Plan and Services Additional resources added to the After Visit Summary for                                       Social Determinants of Health (SDOH) Interventions SDOH Screenings   Food Insecurity: No Food Insecurity (01/04/2023)  Housing: Low Risk  (01/04/2023)  Transportation Needs: No Transportation Needs (01/04/2023)  Utilities: Not At Risk (01/04/2023)  Tobacco Use: Low Risk  (01/04/2023)     Readmission Risk Interventions     No data to display

## 2023-01-10 NOTE — Plan of Care (Signed)
Patient is stable for discharge.  Joan Ingram Joan Ingram  

## 2023-01-11 DIAGNOSIS — D649 Anemia, unspecified: Secondary | ICD-10-CM | POA: Diagnosis not present

## 2023-01-11 DIAGNOSIS — I5033 Acute on chronic diastolic (congestive) heart failure: Secondary | ICD-10-CM | POA: Diagnosis not present

## 2023-01-12 ENCOUNTER — Telehealth: Payer: Self-pay | Admitting: Oncology

## 2023-01-12 NOTE — Telephone Encounter (Signed)
Patient has appointment 4/29 for MD/lab and 4/30 for blood/venofer. She states she was recently in the hospital and is now at [facility]. She would like to reschedule if it is not necessary for her to come. Please advise.

## 2023-01-15 ENCOUNTER — Inpatient Hospital Stay: Payer: PPO

## 2023-01-15 ENCOUNTER — Inpatient Hospital Stay: Payer: PPO | Admitting: Oncology

## 2023-01-15 DIAGNOSIS — D649 Anemia, unspecified: Secondary | ICD-10-CM | POA: Diagnosis not present

## 2023-01-15 DIAGNOSIS — I5032 Chronic diastolic (congestive) heart failure: Secondary | ICD-10-CM | POA: Diagnosis not present

## 2023-01-16 ENCOUNTER — Inpatient Hospital Stay: Payer: PPO

## 2023-01-17 DIAGNOSIS — D649 Anemia, unspecified: Secondary | ICD-10-CM | POA: Diagnosis not present

## 2023-01-17 DIAGNOSIS — I5032 Chronic diastolic (congestive) heart failure: Secondary | ICD-10-CM | POA: Diagnosis not present

## 2023-01-17 DIAGNOSIS — R770 Abnormality of albumin: Secondary | ICD-10-CM | POA: Diagnosis not present

## 2023-01-19 DIAGNOSIS — I5032 Chronic diastolic (congestive) heart failure: Secondary | ICD-10-CM | POA: Diagnosis not present

## 2023-01-24 DIAGNOSIS — K219 Gastro-esophageal reflux disease without esophagitis: Secondary | ICD-10-CM | POA: Diagnosis not present

## 2023-01-24 DIAGNOSIS — I5032 Chronic diastolic (congestive) heart failure: Secondary | ICD-10-CM | POA: Diagnosis not present

## 2023-01-25 DIAGNOSIS — D649 Anemia, unspecified: Secondary | ICD-10-CM | POA: Diagnosis not present

## 2023-01-25 DIAGNOSIS — K219 Gastro-esophageal reflux disease without esophagitis: Secondary | ICD-10-CM | POA: Diagnosis not present

## 2023-01-25 DIAGNOSIS — I5032 Chronic diastolic (congestive) heart failure: Secondary | ICD-10-CM | POA: Diagnosis not present

## 2023-01-29 DIAGNOSIS — D649 Anemia, unspecified: Secondary | ICD-10-CM | POA: Diagnosis not present

## 2023-01-29 DIAGNOSIS — I5032 Chronic diastolic (congestive) heart failure: Secondary | ICD-10-CM | POA: Diagnosis not present

## 2023-02-01 DIAGNOSIS — D649 Anemia, unspecified: Secondary | ICD-10-CM | POA: Diagnosis not present

## 2023-02-01 DIAGNOSIS — I5032 Chronic diastolic (congestive) heart failure: Secondary | ICD-10-CM | POA: Diagnosis not present

## 2023-02-12 DIAGNOSIS — D649 Anemia, unspecified: Secondary | ICD-10-CM | POA: Diagnosis not present

## 2023-02-12 DIAGNOSIS — I5032 Chronic diastolic (congestive) heart failure: Secondary | ICD-10-CM | POA: Diagnosis not present

## 2023-02-12 DIAGNOSIS — I480 Paroxysmal atrial fibrillation: Secondary | ICD-10-CM | POA: Diagnosis not present

## 2023-02-12 DIAGNOSIS — K219 Gastro-esophageal reflux disease without esophagitis: Secondary | ICD-10-CM | POA: Diagnosis not present

## 2023-02-15 DIAGNOSIS — I5032 Chronic diastolic (congestive) heart failure: Secondary | ICD-10-CM | POA: Diagnosis not present

## 2023-02-16 DIAGNOSIS — I10 Essential (primary) hypertension: Secondary | ICD-10-CM | POA: Diagnosis not present

## 2023-02-28 ENCOUNTER — Emergency Department: Payer: PPO

## 2023-02-28 ENCOUNTER — Other Ambulatory Visit: Payer: Self-pay

## 2023-02-28 ENCOUNTER — Encounter: Payer: Self-pay | Admitting: Emergency Medicine

## 2023-02-28 ENCOUNTER — Emergency Department
Admission: EM | Admit: 2023-02-28 | Discharge: 2023-02-28 | Disposition: A | Discharge status: Home / Self Care | Payer: PPO | Attending: Emergency Medicine | Admitting: Emergency Medicine

## 2023-02-28 DIAGNOSIS — I10 Essential (primary) hypertension: Secondary | ICD-10-CM | POA: Diagnosis not present

## 2023-02-28 DIAGNOSIS — E876 Hypokalemia: Secondary | ICD-10-CM | POA: Diagnosis not present

## 2023-02-28 DIAGNOSIS — Z7901 Long term (current) use of anticoagulants: Secondary | ICD-10-CM | POA: Diagnosis not present

## 2023-02-28 DIAGNOSIS — I110 Hypertensive heart disease with heart failure: Secondary | ICD-10-CM | POA: Insufficient documentation

## 2023-02-28 DIAGNOSIS — R0789 Other chest pain: Secondary | ICD-10-CM | POA: Diagnosis not present

## 2023-02-28 DIAGNOSIS — J45909 Unspecified asthma, uncomplicated: Secondary | ICD-10-CM | POA: Insufficient documentation

## 2023-02-28 DIAGNOSIS — R0902 Hypoxemia: Secondary | ICD-10-CM | POA: Diagnosis not present

## 2023-02-28 DIAGNOSIS — R29898 Other symptoms and signs involving the musculoskeletal system: Secondary | ICD-10-CM | POA: Diagnosis not present

## 2023-02-28 DIAGNOSIS — Z7401 Bed confinement status: Secondary | ICD-10-CM | POA: Diagnosis not present

## 2023-02-28 DIAGNOSIS — I5032 Chronic diastolic (congestive) heart failure: Secondary | ICD-10-CM | POA: Diagnosis not present

## 2023-02-28 DIAGNOSIS — R600 Localized edema: Secondary | ICD-10-CM | POA: Diagnosis not present

## 2023-02-28 DIAGNOSIS — I5033 Acute on chronic diastolic (congestive) heart failure: Secondary | ICD-10-CM | POA: Insufficient documentation

## 2023-02-28 DIAGNOSIS — I491 Atrial premature depolarization: Secondary | ICD-10-CM | POA: Diagnosis not present

## 2023-02-28 DIAGNOSIS — R079 Chest pain, unspecified: Secondary | ICD-10-CM | POA: Diagnosis not present

## 2023-02-28 DIAGNOSIS — K449 Diaphragmatic hernia without obstruction or gangrene: Secondary | ICD-10-CM | POA: Diagnosis not present

## 2023-02-28 DIAGNOSIS — J9811 Atelectasis: Secondary | ICD-10-CM | POA: Diagnosis not present

## 2023-02-28 DIAGNOSIS — J90 Pleural effusion, not elsewhere classified: Secondary | ICD-10-CM | POA: Diagnosis not present

## 2023-02-28 DIAGNOSIS — I4891 Unspecified atrial fibrillation: Secondary | ICD-10-CM | POA: Diagnosis not present

## 2023-02-28 LAB — CBC WITH DIFFERENTIAL/PLATELET
Abs Immature Granulocytes: 0.01 10*3/uL (ref 0.00–0.07)
Basophils Absolute: 0 10*3/uL (ref 0.0–0.1)
Basophils Relative: 1 %
Eosinophils Absolute: 0.1 10*3/uL (ref 0.0–0.5)
Eosinophils Relative: 2 %
HCT: 27 % — ABNORMAL LOW (ref 36.0–46.0)
Hemoglobin: 8.4 g/dL — ABNORMAL LOW (ref 12.0–15.0)
Immature Granulocytes: 0 %
Lymphocytes Relative: 17 %
Lymphs Abs: 1 10*3/uL (ref 0.7–4.0)
MCH: 28.6 pg (ref 26.0–34.0)
MCHC: 31.1 g/dL (ref 30.0–36.0)
MCV: 91.8 fL (ref 80.0–100.0)
Monocytes Absolute: 0.9 10*3/uL (ref 0.1–1.0)
Monocytes Relative: 16 %
Neutro Abs: 3.8 10*3/uL (ref 1.7–7.7)
Neutrophils Relative %: 64 %
Platelets: 324 10*3/uL (ref 150–400)
RBC: 2.94 MIL/uL — ABNORMAL LOW (ref 3.87–5.11)
RDW: 19.1 % — ABNORMAL HIGH (ref 11.5–15.5)
WBC: 5.9 10*3/uL (ref 4.0–10.5)
nRBC: 0 % (ref 0.0–0.2)

## 2023-02-28 LAB — URINALYSIS, ROUTINE W REFLEX MICROSCOPIC
Bacteria, UA: NONE SEEN
Bilirubin Urine: NEGATIVE
Glucose, UA: NEGATIVE mg/dL
Ketones, ur: NEGATIVE mg/dL
Nitrite: NEGATIVE
Protein, ur: NEGATIVE mg/dL
Specific Gravity, Urine: 1.004 — ABNORMAL LOW (ref 1.005–1.030)
pH: 7 (ref 5.0–8.0)

## 2023-02-28 LAB — COMPREHENSIVE METABOLIC PANEL
ALT: 8 U/L (ref 0–44)
AST: 25 U/L (ref 15–41)
Albumin: 3.1 g/dL — ABNORMAL LOW (ref 3.5–5.0)
Alkaline Phosphatase: 55 U/L (ref 38–126)
Anion gap: 9 (ref 5–15)
BUN: 18 mg/dL (ref 8–23)
CO2: 30 mmol/L (ref 22–32)
Calcium: 7.1 mg/dL — ABNORMAL LOW (ref 8.9–10.3)
Chloride: 98 mmol/L (ref 98–111)
Creatinine, Ser: 0.92 mg/dL (ref 0.44–1.00)
GFR, Estimated: 59 mL/min — ABNORMAL LOW (ref >60)
Glucose, Bld: 132 mg/dL — ABNORMAL HIGH (ref 70–99)
Potassium: 2.6 mmol/L — CL (ref 3.5–5.1)
Sodium: 137 mmol/L (ref 135–145)
Total Bilirubin: 0.6 mg/dL (ref 0.3–1.2)
Total Protein: 6.6 g/dL (ref 6.5–8.1)

## 2023-02-28 LAB — TROPONIN I (HIGH SENSITIVITY)
Troponin I (High Sensitivity): 24 ng/L — ABNORMAL HIGH (ref <18)
Troponin I (High Sensitivity): 24 ng/L — ABNORMAL HIGH (ref <18)

## 2023-02-28 LAB — BRAIN NATRIURETIC PEPTIDE: B Natriuretic Peptide: 251.1 pg/mL — ABNORMAL HIGH (ref 0.0–100.0)

## 2023-02-28 LAB — PROTIME-INR
INR: 1.3 — ABNORMAL HIGH (ref 0.8–1.2)
Prothrombin Time: 16.2 seconds — ABNORMAL HIGH (ref 11.4–15.2)

## 2023-02-28 MED ORDER — POTASSIUM CHLORIDE 10 MEQ/100ML IV SOLN
10.0000 meq | Freq: Once | INTRAVENOUS | Status: AC
  Administered 2023-02-28: 10 meq via INTRAVENOUS
  Filled 2023-02-28: qty 100

## 2023-02-28 MED ORDER — POTASSIUM CHLORIDE CRYS ER 20 MEQ PO TBCR
40.0000 meq | EXTENDED_RELEASE_TABLET | Freq: Once | ORAL | Status: AC
  Administered 2023-02-28: 40 meq via ORAL
  Filled 2023-02-28: qty 2

## 2023-02-28 MED ORDER — POTASSIUM CHLORIDE ER 10 MEQ PO TBCR: 10.0000 meq | EXTENDED_RELEASE_TABLET | Freq: Two times a day (BID) | ORAL | 0 refills | Status: AC

## 2023-02-28 NOTE — Discharge Instructions (Addendum)
Take the potassium tablets twice daily for the next 5 days as prescribed.  Follow-up with your primary care doctor to have your potassium level rechecked within the next week.  Return to the ER for new, worsening, or persistent severe chest pain, any difficulty breathing, lightheadedness, muscle cramps or spasms, palpitations, tremors, numbness or weakness, or any other new or worsening symptoms that concern you.

## 2023-02-28 NOTE — ED Provider Notes (Signed)
[facility] Provider Note    Event Date/Time   First MD Initiated Contact with Patient 02/28/23 1659     (approximate)   History   Chest Pain   HPI  Joan Ingram is a 89 y.o. female with a history of hypertension, HFpEF, GERD, iron deficiency anemia, atrial fibrillation on Xarelto, asthma, and osteoporosis who presents with chest pain and started early this morning and has now resolved.  It is central and described as a sensation of heaviness.  The patient denies any associated shortness of breath, weakness or dizziness, nausea or vomiting.  The patient reports some discomfort and heaviness in the left arm associated with the pain.  She denies any significant swelling to the arm.  She has no fever or chills.  The patient denies any numbness or tingling, muscle cramps, or palpitations.  I reviewed the medical records.  The patient was admitted in April with acute on chronic iron deficiency anemia requiring blood transfusion.   Physical Exam   Triage Vital Signs: ED Triage Vitals [02/28/23 1608]  Enc Vitals Group     BP 106/72     Pulse Rate 85     Resp 16     Temp 98.5 F (36.9 C)     Temp Source Oral     SpO2 94 %     Weight 147 lb 8 oz (66.9 kg)     Height 5' (1.524 m)     Head Circumference      [facility] Flow      Pain Score 5     Pain Loc      Pain Edu?      Excl. in GC?     Most recent vital signs: Vitals:   02/28/23 1845 02/28/23 1930  BP: (!) 100/59 119/60  Pulse: 85 85  Resp: 18 (!) 21  Temp:    SpO2: 99% 100%     General: Awake, no distress.  CV:  Good peripheral perfusion.  Normal heart sounds. Resp:  Normal effort.  Lungs CTAB. Abd:  No distention.  Other:  Left upper extremity with no significant edema.  2+ radial pulse.  Motor and sensory intact in all extremities.   ED Results / Procedures / Treatments   Labs (all labs ordered are listed, but only abnormal results are displayed) Labs Reviewed  COMPREHENSIVE  METABOLIC PANEL - Abnormal; Notable for the following components:      Result Value   Potassium 2.6 (*)    Glucose, Bld 132 (*)    Calcium 7.1 (*)    Albumin 3.1 (*)    GFR, Estimated 59 (*)    All other components within normal limits  CBC WITH DIFFERENTIAL/PLATELET - Abnormal; Notable for the following components:   RBC 2.94 (*)    Hemoglobin 8.4 (*)    HCT 27.0 (*)    RDW 19.1 (*)    All other components within normal limits  URINALYSIS, ROUTINE W REFLEX MICROSCOPIC - Abnormal; Notable for the following components:   Color, Urine STRAW (*)    APPearance CLEAR (*)    Specific Gravity, Urine 1.004 (*)    Hgb urine dipstick SMALL (*)    Leukocytes,Ua SMALL (*)    All other components within normal limits  PROTIME-INR - Abnormal; Notable for the following components:   Prothrombin Time 16.2 (*)    INR 1.3 (*)    All other components within normal limits  BRAIN NATRIURETIC PEPTIDE - Abnormal; Notable for the  following components:   B Natriuretic Peptide 251.1 (*)    All other components within normal limits  TROPONIN I (HIGH SENSITIVITY) - Abnormal; Notable for the following components:   Troponin I (High Sensitivity) 24 (*)    All other components within normal limits  TROPONIN I (HIGH SENSITIVITY) - Abnormal; Notable for the following components:   Troponin I (High Sensitivity) 24 (*)    All other components within normal limits     EKG  ED ECG REPORT I, Dionne Bucy, the attending physician, personally viewed and interpreted this ECG.  Date: 02/28/2023 EKG Time: 1620 Rate: 90 Rhythm: Atrial fibrillation QRS Axis: normal Intervals: normal ST/T Wave abnormalities: normal Narrative Interpretation: no evidence of acute ischemia    RADIOLOGY  Chest x-ray: I independently viewed and interpreted the images; there is no focal consolidation or edema.  Radiology report indicates possible trace left pleural effusion.  US venous LUE: No acute  DVT   PROCEDURES:  Critical Care performed: No  Procedures   MEDICATIONS ORDERED IN ED: Medications  potassium chloride SA (KLOR-CON M) CR tablet 40 mEq (40 mEq Oral Given 02/28/23 1756)  potassium chloride 10 mEq in 100 mL IVPB (0 mEq Intravenous Stopped 02/28/23 2031)  potassium chloride 10 mEq in 100 mL IVPB (0 mEq Intravenous Stopped 02/28/23 2031)     IMPRESSION / MDM / ASSESSMENT AND PLAN / ED COURSE  I reviewed the triage vital signs and the nursing notes.  89 year old female with PMH as noted above presents with atypical chest pain which has now resolved as well as with some pain in the left arm.  On exam her vital signs are normal.  Neurologic exam is nonfocal.  There is no significant exam findings to the left arm.  Differential diagnosis includes, but is not limited to, ACS, musculoskeletal pain, GERD, neuropathy.  I do suspect PE given lack of tachycardia or hypoxia and the fact that the pain resolved spontaneously.  Initial lab workup shows hypokalemia.  Other electrolytes are normal.  The patient is anemic but this is chronic.  Initial troponin is minimally elevated consistent with baseline.  Chest x-ray shows no acute findings.  Ultrasound of the LUE was obtained from triage and shows no evidence of DVT.  We will obtain repeat troponin, replete potassium by IV and p.o., and reassess.  Patient's presentation is most consistent with acute presentation with potential threat to life or bodily function.  The patient is on the cardiac monitor to evaluate for evidence of arrhythmia and/or significant heart rate changes.   ----------------------------------------- 8:44 PM on 02/28/2023 -----------------------------------------  Repeat troponin is unchanged.  The patient has not had any further chest pain and is otherwise asymptomatic at this time.  I did consider whether the patient may benefit from admission due to the hypokalemia, and I offered this to her.  However the  patient expresses a strong desire to go home which I feel is reasonable given the overall reassuring workup.  I have prescribed additional potassium repletion for home.  I gave strict return precautions and the patient expressed understanding.   FINAL CLINICAL IMPRESSION(S) / ED DIAGNOSES   Final diagnoses:  Hypokalemia  Atypical chest pain     Rx / DC Orders   ED Discharge Orders          Ordered    potassium chloride (KLOR-CON) 10 MEQ tablet  2 times daily        02/28/23 2042  Note:  This document was prepared using Dragon voice recognition software and may include unintentional dictation errors.    Dionne Bucy, MD 02/28/23 2330

## 2023-02-28 NOTE — ED Notes (Signed)
Report called to [facility], IV taken out.

## 2023-02-28 NOTE — ED Triage Notes (Signed)
Pt via [facility] from [facility] c/o central chest heaviness since around 3am this morning. Prior hx MI. Pt states the heaviness radiates to left arm and she feels occasionally SOB.

## 2023-02-28 NOTE — ED Notes (Signed)
Called [facility] to arrange transport to [facility] she has been added to the list.Waiting on [facility] arrival

## 2023-02-28 NOTE — ED Provider Triage Note (Signed)
Emergency Medicine Provider Triage Evaluation Note  Joan Ingram , a 89 y.o. female  was evaluated in triage.  Pt complains of pressure like chest pain beginning this AM. Does have a history of MI in the past. Feels similar. No cough/fever,shob. No GI symptoms. This pain woke her from her sleep. Feels like pressure. 5/10  Review of Systems  Positive: CP Negative: Fever, URI symptoms, GI symptoms  Physical Exam  There were no vitals taken for this visit. Gen:   Awake, no distress   Resp:  Normal effort  MSK:   Moves extremities without difficulty  Other:    Medical Decision Making  Medically screening exam initiated at 4:05 PM.  Appropriate orders placed.  Bresha LUCCIA REINHEIMER was informed that the remainder of the evaluation will be completed by another provider, this initial triage assessment does not replace that evaluation, and the importance of remaining in the ED until their evaluation is complete.  Labs, EKG, chest xray   Racheal Patches, PA-C 02/28/23 1608

## 2023-02-28 NOTE — ED Notes (Signed)
Assisted patient to the commode in room. Observed patient walking with steady gait. Pt denies chest pain at this time.

## 2023-02-28 NOTE — ED Notes (Signed)
First Nurse Note: Pt to ED via [facility] from chest discomfort and left arm heaviness since last night. Pt has hx/o A. Fib. HR 70-90 after fluids.   BP: 108/67 SpO2: 97%

## 2023-03-01 DIAGNOSIS — E876 Hypokalemia: Secondary | ICD-10-CM | POA: Diagnosis not present

## 2023-03-01 DIAGNOSIS — I5032 Chronic diastolic (congestive) heart failure: Secondary | ICD-10-CM | POA: Diagnosis not present

## 2023-03-01 DIAGNOSIS — N1831 Chronic kidney disease, stage 3a: Secondary | ICD-10-CM | POA: Diagnosis not present

## 2023-03-01 DIAGNOSIS — R079 Chest pain, unspecified: Secondary | ICD-10-CM | POA: Diagnosis not present

## 2023-03-05 DIAGNOSIS — I509 Heart failure, unspecified: Secondary | ICD-10-CM | POA: Diagnosis not present

## 2023-03-06 DIAGNOSIS — E876 Hypokalemia: Secondary | ICD-10-CM | POA: Diagnosis not present

## 2023-03-06 DIAGNOSIS — I5032 Chronic diastolic (congestive) heart failure: Secondary | ICD-10-CM | POA: Diagnosis not present

## 2023-03-06 DIAGNOSIS — R079 Chest pain, unspecified: Secondary | ICD-10-CM | POA: Diagnosis not present

## 2023-03-06 DIAGNOSIS — N1831 Chronic kidney disease, stage 3a: Secondary | ICD-10-CM | POA: Diagnosis not present

## 2023-03-14 DIAGNOSIS — I4819 Other persistent atrial fibrillation: Secondary | ICD-10-CM | POA: Diagnosis not present

## 2023-03-14 DIAGNOSIS — I5032 Chronic diastolic (congestive) heart failure: Secondary | ICD-10-CM | POA: Diagnosis not present

## 2023-03-14 DIAGNOSIS — I272 Pulmonary hypertension, unspecified: Secondary | ICD-10-CM | POA: Diagnosis not present

## 2023-03-14 DIAGNOSIS — I10 Essential (primary) hypertension: Secondary | ICD-10-CM | POA: Diagnosis not present

## 2023-03-15 DIAGNOSIS — I5033 Acute on chronic diastolic (congestive) heart failure: Secondary | ICD-10-CM | POA: Diagnosis not present

## 2023-03-15 DIAGNOSIS — K219 Gastro-esophageal reflux disease without esophagitis: Secondary | ICD-10-CM | POA: Diagnosis not present

## 2023-03-15 DIAGNOSIS — E876 Hypokalemia: Secondary | ICD-10-CM | POA: Diagnosis not present

## 2023-03-15 DIAGNOSIS — E559 Vitamin D deficiency, unspecified: Secondary | ICD-10-CM | POA: Diagnosis not present

## 2023-03-15 DIAGNOSIS — N1831 Chronic kidney disease, stage 3a: Secondary | ICD-10-CM | POA: Diagnosis not present

## 2023-03-16 DIAGNOSIS — I5033 Acute on chronic diastolic (congestive) heart failure: Secondary | ICD-10-CM | POA: Diagnosis not present

## 2023-03-16 DIAGNOSIS — E876 Hypokalemia: Secondary | ICD-10-CM | POA: Diagnosis not present

## 2023-03-16 DIAGNOSIS — E559 Vitamin D deficiency, unspecified: Secondary | ICD-10-CM | POA: Diagnosis not present

## 2023-03-17 DIAGNOSIS — R0602 Shortness of breath: Secondary | ICD-10-CM | POA: Diagnosis not present

## 2023-03-17 DIAGNOSIS — I5020 Unspecified systolic (congestive) heart failure: Secondary | ICD-10-CM | POA: Diagnosis not present

## 2023-03-17 DIAGNOSIS — I517 Cardiomegaly: Secondary | ICD-10-CM | POA: Diagnosis not present

## 2023-03-19 DIAGNOSIS — E559 Vitamin D deficiency, unspecified: Secondary | ICD-10-CM | POA: Diagnosis not present

## 2023-03-19 DIAGNOSIS — I5033 Acute on chronic diastolic (congestive) heart failure: Secondary | ICD-10-CM | POA: Diagnosis not present

## 2023-03-19 DIAGNOSIS — N1831 Chronic kidney disease, stage 3a: Secondary | ICD-10-CM | POA: Diagnosis not present

## 2023-03-19 DIAGNOSIS — I10 Essential (primary) hypertension: Secondary | ICD-10-CM | POA: Diagnosis not present

## 2023-03-19 DIAGNOSIS — E876 Hypokalemia: Secondary | ICD-10-CM | POA: Diagnosis not present

## 2023-03-22 DIAGNOSIS — I480 Paroxysmal atrial fibrillation: Secondary | ICD-10-CM | POA: Diagnosis not present

## 2023-03-22 DIAGNOSIS — Z7951 Long term (current) use of inhaled steroids: Secondary | ICD-10-CM | POA: Diagnosis not present

## 2023-03-22 DIAGNOSIS — I130 Hypertensive heart and chronic kidney disease with heart failure and stage 1 through stage 4 chronic kidney disease, or unspecified chronic kidney disease: Secondary | ICD-10-CM | POA: Diagnosis not present

## 2023-03-22 DIAGNOSIS — M810 Age-related osteoporosis without current pathological fracture: Secondary | ICD-10-CM | POA: Diagnosis not present

## 2023-03-22 DIAGNOSIS — R41841 Cognitive communication deficit: Secondary | ICD-10-CM | POA: Diagnosis not present

## 2023-03-22 DIAGNOSIS — Z9181 History of falling: Secondary | ICD-10-CM | POA: Diagnosis not present

## 2023-03-22 DIAGNOSIS — J45909 Unspecified asthma, uncomplicated: Secondary | ICD-10-CM | POA: Diagnosis not present

## 2023-03-22 DIAGNOSIS — K219 Gastro-esophageal reflux disease without esophagitis: Secondary | ICD-10-CM | POA: Diagnosis not present

## 2023-03-22 DIAGNOSIS — E876 Hypokalemia: Secondary | ICD-10-CM | POA: Diagnosis not present

## 2023-03-22 DIAGNOSIS — E441 Mild protein-calorie malnutrition: Secondary | ICD-10-CM | POA: Diagnosis not present

## 2023-03-22 DIAGNOSIS — N1831 Chronic kidney disease, stage 3a: Secondary | ICD-10-CM | POA: Diagnosis not present

## 2023-03-22 DIAGNOSIS — Z5982 Transportation insecurity: Secondary | ICD-10-CM | POA: Diagnosis not present

## 2023-03-22 DIAGNOSIS — Z7901 Long term (current) use of anticoagulants: Secondary | ICD-10-CM | POA: Diagnosis not present

## 2023-03-22 DIAGNOSIS — I890 Lymphedema, not elsewhere classified: Secondary | ICD-10-CM | POA: Diagnosis not present

## 2023-03-22 DIAGNOSIS — G8929 Other chronic pain: Secondary | ICD-10-CM | POA: Diagnosis not present

## 2023-03-22 DIAGNOSIS — D509 Iron deficiency anemia, unspecified: Secondary | ICD-10-CM | POA: Diagnosis not present

## 2023-03-22 DIAGNOSIS — E871 Hypo-osmolality and hyponatremia: Secondary | ICD-10-CM | POA: Diagnosis not present

## 2023-03-22 DIAGNOSIS — I5032 Chronic diastolic (congestive) heart failure: Secondary | ICD-10-CM | POA: Diagnosis not present

## 2023-03-29 DIAGNOSIS — I130 Hypertensive heart and chronic kidney disease with heart failure and stage 1 through stage 4 chronic kidney disease, or unspecified chronic kidney disease: Secondary | ICD-10-CM | POA: Diagnosis not present

## 2023-03-29 DIAGNOSIS — E876 Hypokalemia: Secondary | ICD-10-CM | POA: Diagnosis not present

## 2023-03-29 DIAGNOSIS — N1831 Chronic kidney disease, stage 3a: Secondary | ICD-10-CM | POA: Diagnosis not present

## 2023-03-29 DIAGNOSIS — I480 Paroxysmal atrial fibrillation: Secondary | ICD-10-CM | POA: Diagnosis not present

## 2023-03-29 DIAGNOSIS — E871 Hypo-osmolality and hyponatremia: Secondary | ICD-10-CM | POA: Diagnosis not present

## 2023-03-29 DIAGNOSIS — I5032 Chronic diastolic (congestive) heart failure: Secondary | ICD-10-CM | POA: Diagnosis not present

## 2023-03-29 DIAGNOSIS — E441 Mild protein-calorie malnutrition: Secondary | ICD-10-CM | POA: Diagnosis not present

## 2023-04-02 ENCOUNTER — Other Ambulatory Visit: Payer: Self-pay

## 2023-04-02 ENCOUNTER — Emergency Department: Payer: PPO

## 2023-04-02 ENCOUNTER — Inpatient Hospital Stay
Admission: EM | Admit: 2023-04-02 | Discharge: 2023-04-11 | DRG: 291 | Disposition: A | Discharge status: Skilled Nursing Facility | Payer: PPO | Source: Skilled Nursing Facility | Attending: Internal Medicine | Admitting: Internal Medicine

## 2023-04-02 DIAGNOSIS — I872 Venous insufficiency (chronic) (peripheral): Secondary | ICD-10-CM | POA: Diagnosis present

## 2023-04-02 DIAGNOSIS — Z888 Allergy status to other drugs, medicaments and biological substances status: Secondary | ICD-10-CM | POA: Diagnosis not present

## 2023-04-02 DIAGNOSIS — Z7951 Long term (current) use of inhaled steroids: Secondary | ICD-10-CM

## 2023-04-02 DIAGNOSIS — R262 Difficulty in walking, not elsewhere classified: Secondary | ICD-10-CM | POA: Diagnosis not present

## 2023-04-02 DIAGNOSIS — M25562 Pain in left knee: Secondary | ICD-10-CM | POA: Diagnosis not present

## 2023-04-02 DIAGNOSIS — R600 Localized edema: Secondary | ICD-10-CM | POA: Diagnosis not present

## 2023-04-02 DIAGNOSIS — M810 Age-related osteoporosis without current pathological fracture: Secondary | ICD-10-CM | POA: Diagnosis not present

## 2023-04-02 DIAGNOSIS — E876 Hypokalemia: Secondary | ICD-10-CM | POA: Diagnosis not present

## 2023-04-02 DIAGNOSIS — R41841 Cognitive communication deficit: Secondary | ICD-10-CM | POA: Diagnosis not present

## 2023-04-02 DIAGNOSIS — I5033 Acute on chronic diastolic (congestive) heart failure: Secondary | ICD-10-CM | POA: Diagnosis present

## 2023-04-02 DIAGNOSIS — I110 Hypertensive heart disease with heart failure: Principal | ICD-10-CM | POA: Diagnosis present

## 2023-04-02 DIAGNOSIS — Z7401 Bed confinement status: Secondary | ICD-10-CM | POA: Diagnosis not present

## 2023-04-02 DIAGNOSIS — R1311 Dysphagia, oral phase: Secondary | ICD-10-CM | POA: Diagnosis not present

## 2023-04-02 DIAGNOSIS — Y92239 Unspecified place in hospital as the place of occurrence of the external cause: Secondary | ICD-10-CM | POA: Diagnosis not present

## 2023-04-02 DIAGNOSIS — T501X5A Adverse effect of loop [high-ceiling] diuretics, initial encounter: Secondary | ICD-10-CM | POA: Diagnosis not present

## 2023-04-02 DIAGNOSIS — J4489 Other specified chronic obstructive pulmonary disease: Secondary | ICD-10-CM | POA: Diagnosis present

## 2023-04-02 DIAGNOSIS — I272 Pulmonary hypertension, unspecified: Secondary | ICD-10-CM | POA: Diagnosis present

## 2023-04-02 DIAGNOSIS — R269 Unspecified abnormalities of gait and mobility: Secondary | ICD-10-CM | POA: Diagnosis not present

## 2023-04-02 DIAGNOSIS — R918 Other nonspecific abnormal finding of lung field: Secondary | ICD-10-CM | POA: Diagnosis not present

## 2023-04-02 DIAGNOSIS — Z66 Do not resuscitate: Secondary | ICD-10-CM | POA: Diagnosis present

## 2023-04-02 DIAGNOSIS — Z79899 Other long term (current) drug therapy: Secondary | ICD-10-CM

## 2023-04-02 DIAGNOSIS — Z299 Encounter for prophylactic measures, unspecified: Secondary | ICD-10-CM | POA: Diagnosis not present

## 2023-04-02 DIAGNOSIS — Z886 Allergy status to analgesic agent status: Secondary | ICD-10-CM | POA: Diagnosis not present

## 2023-04-02 DIAGNOSIS — K449 Diaphragmatic hernia without obstruction or gangrene: Secondary | ICD-10-CM | POA: Diagnosis not present

## 2023-04-02 DIAGNOSIS — I509 Heart failure, unspecified: Secondary | ICD-10-CM | POA: Diagnosis not present

## 2023-04-02 DIAGNOSIS — Z7901 Long term (current) use of anticoagulants: Secondary | ICD-10-CM | POA: Diagnosis not present

## 2023-04-02 DIAGNOSIS — M25561 Pain in right knee: Secondary | ICD-10-CM | POA: Diagnosis not present

## 2023-04-02 DIAGNOSIS — R5381 Other malaise: Secondary | ICD-10-CM | POA: Diagnosis not present

## 2023-04-02 DIAGNOSIS — I480 Paroxysmal atrial fibrillation: Secondary | ICD-10-CM | POA: Diagnosis present

## 2023-04-02 DIAGNOSIS — R001 Bradycardia, unspecified: Secondary | ICD-10-CM | POA: Diagnosis not present

## 2023-04-02 DIAGNOSIS — M6281 Muscle weakness (generalized): Secondary | ICD-10-CM | POA: Diagnosis not present

## 2023-04-02 DIAGNOSIS — Z881 Allergy status to other antibiotic agents status: Secondary | ICD-10-CM

## 2023-04-02 DIAGNOSIS — F0390 Unspecified dementia without behavioral disturbance: Secondary | ICD-10-CM | POA: Diagnosis present

## 2023-04-02 DIAGNOSIS — Z741 Need for assistance with personal care: Secondary | ICD-10-CM | POA: Diagnosis not present

## 2023-04-02 DIAGNOSIS — I10 Essential (primary) hypertension: Secondary | ICD-10-CM | POA: Diagnosis present

## 2023-04-02 DIAGNOSIS — R7989 Other specified abnormal findings of blood chemistry: Secondary | ICD-10-CM | POA: Diagnosis not present

## 2023-04-02 DIAGNOSIS — D649 Anemia, unspecified: Secondary | ICD-10-CM | POA: Diagnosis present

## 2023-04-02 DIAGNOSIS — Z880 Allergy status to penicillin: Secondary | ICD-10-CM

## 2023-04-02 DIAGNOSIS — J45909 Unspecified asthma, uncomplicated: Secondary | ICD-10-CM | POA: Diagnosis not present

## 2023-04-02 DIAGNOSIS — I878 Other specified disorders of veins: Secondary | ICD-10-CM | POA: Diagnosis present

## 2023-04-02 DIAGNOSIS — R6889 Other general symptoms and signs: Secondary | ICD-10-CM | POA: Diagnosis not present

## 2023-04-02 DIAGNOSIS — D509 Iron deficiency anemia, unspecified: Secondary | ICD-10-CM | POA: Diagnosis present

## 2023-04-02 DIAGNOSIS — R0602 Shortness of breath: Secondary | ICD-10-CM | POA: Diagnosis not present

## 2023-04-02 DIAGNOSIS — J449 Chronic obstructive pulmonary disease, unspecified: Secondary | ICD-10-CM | POA: Diagnosis not present

## 2023-04-02 DIAGNOSIS — J45901 Unspecified asthma with (acute) exacerbation: Secondary | ICD-10-CM | POA: Diagnosis not present

## 2023-04-02 DIAGNOSIS — I959 Hypotension, unspecified: Secondary | ICD-10-CM | POA: Diagnosis not present

## 2023-04-02 DIAGNOSIS — R42 Dizziness and giddiness: Secondary | ICD-10-CM | POA: Diagnosis not present

## 2023-04-02 DIAGNOSIS — K219 Gastro-esophageal reflux disease without esophagitis: Secondary | ICD-10-CM | POA: Diagnosis not present

## 2023-04-02 DIAGNOSIS — I5031 Acute diastolic (congestive) heart failure: Secondary | ICD-10-CM | POA: Diagnosis not present

## 2023-04-02 LAB — CBC WITH DIFFERENTIAL/PLATELET
Abs Immature Granulocytes: 0.02 10*3/uL (ref 0.00–0.07)
Basophils Absolute: 0 10*3/uL (ref 0.0–0.1)
Basophils Relative: 1 %
Eosinophils Absolute: 0.2 10*3/uL (ref 0.0–0.5)
Eosinophils Relative: 3 %
HCT: 27.3 % — ABNORMAL LOW (ref 36.0–46.0)
Hemoglobin: 8.6 g/dL — ABNORMAL LOW (ref 12.0–15.0)
Immature Granulocytes: 0 %
Lymphocytes Relative: 17 %
Lymphs Abs: 1 10*3/uL (ref 0.7–4.0)
MCH: 29 pg (ref 26.0–34.0)
MCHC: 31.5 g/dL (ref 30.0–36.0)
MCV: 91.9 fL (ref 80.0–100.0)
Monocytes Absolute: 0.8 10*3/uL (ref 0.1–1.0)
Monocytes Relative: 14 %
Neutro Abs: 3.9 10*3/uL (ref 1.7–7.7)
Neutrophils Relative %: 65 %
Platelets: 374 10*3/uL (ref 150–400)
RBC: 2.97 MIL/uL — ABNORMAL LOW (ref 3.87–5.11)
RDW: 16.4 % — ABNORMAL HIGH (ref 11.5–15.5)
WBC: 5.8 10*3/uL (ref 4.0–10.5)
nRBC: 0 % (ref 0.0–0.2)

## 2023-04-02 LAB — COMPREHENSIVE METABOLIC PANEL
ALT: 12 U/L (ref 0–44)
AST: 25 U/L (ref 15–41)
Albumin: 2.7 g/dL — ABNORMAL LOW (ref 3.5–5.0)
Alkaline Phosphatase: 55 U/L (ref 38–126)
Anion gap: 9 (ref 5–15)
BUN: 20 mg/dL (ref 8–23)
CO2: 26 mmol/L (ref 22–32)
Calcium: 8.7 mg/dL — ABNORMAL LOW (ref 8.9–10.3)
Chloride: 98 mmol/L (ref 98–111)
Creatinine, Ser: 1.15 mg/dL — ABNORMAL HIGH (ref 0.44–1.00)
GFR, Estimated: 45 mL/min — ABNORMAL LOW (ref >60)
Glucose, Bld: 111 mg/dL — ABNORMAL HIGH (ref 70–99)
Potassium: 3.9 mmol/L (ref 3.5–5.1)
Sodium: 133 mmol/L — ABNORMAL LOW (ref 135–145)
Total Bilirubin: 0.6 mg/dL (ref 0.3–1.2)
Total Protein: 6.7 g/dL (ref 6.5–8.1)

## 2023-04-02 LAB — TROPONIN I (HIGH SENSITIVITY)
Troponin I (High Sensitivity): 20 ng/L — ABNORMAL HIGH (ref <18)
Troponin I (High Sensitivity): 19 ng/L — ABNORMAL HIGH (ref <18)

## 2023-04-02 LAB — BRAIN NATRIURETIC PEPTIDE: B Natriuretic Peptide: 160.6 pg/mL — ABNORMAL HIGH (ref 0.0–100.0)

## 2023-04-02 MED ORDER — SODIUM CHLORIDE 0.9% FLUSH
3.0000 mL | Freq: Two times a day (BID) | INTRAVENOUS | Status: DC
  Administered 2023-04-02 – 2023-04-10 (×16): 3 mL via INTRAVENOUS

## 2023-04-02 MED ORDER — FUROSEMIDE 10 MG/ML IJ SOLN: 40.0000 mg | Freq: Every day | INTRAMUSCULAR | Status: DC

## 2023-04-02 MED ORDER — SODIUM CHLORIDE 0.9% FLUSH: 3.0000 mL | INTRAVENOUS | Status: DC | PRN

## 2023-04-02 MED ORDER — MOMETASONE FURO-FORMOTEROL FUM 200-5 MCG/ACT IN AERO
2.0000 | INHALATION_SPRAY | Freq: Two times a day (BID) | RESPIRATORY_TRACT | Status: DC
  Administered 2023-04-02 – 2023-04-11 (×17): 2 via RESPIRATORY_TRACT
  Filled 2023-04-02 (×2): qty 8.8

## 2023-04-02 MED ORDER — ONDANSETRON HCL 4 MG/2ML IJ SOLN: 4.0000 mg | Freq: Four times a day (QID) | INTRAMUSCULAR | Status: DC | PRN

## 2023-04-02 MED ORDER — FUROSEMIDE 10 MG/ML IJ SOLN
40.0000 mg | Freq: Once | INTRAMUSCULAR | Status: AC
  Administered 2023-04-02: 40 mg via INTRAVENOUS
  Filled 2023-04-02: qty 4

## 2023-04-02 MED ORDER — SODIUM CHLORIDE 0.9 % IV SOLN: 250.0000 mL | INTRAVENOUS | Status: DC | PRN

## 2023-04-02 MED ORDER — RIVAROXABAN 15 MG PO TABS
15.0000 mg | ORAL_TABLET | Freq: Every day | ORAL | Status: DC
  Administered 2023-04-03 – 2023-04-10 (×8): 15 mg via ORAL
  Filled 2023-04-02 (×9): qty 1

## 2023-04-02 MED ORDER — IOHEXOL 300 MG/ML  SOLN
75.0000 mL | Freq: Once | INTRAMUSCULAR | Status: AC | PRN
  Administered 2023-04-02: 60 mL via INTRAVENOUS

## 2023-04-02 MED ORDER — PANTOPRAZOLE SODIUM 40 MG PO TBEC
40.0000 mg | DELAYED_RELEASE_TABLET | Freq: Two times a day (BID) | ORAL | Status: DC
  Administered 2023-04-03 – 2023-04-11 (×17): 40 mg via ORAL
  Filled 2023-04-02 (×17): qty 1

## 2023-04-02 MED ORDER — ACETAMINOPHEN 325 MG PO TABS: 650.0000 mg | ORAL_TABLET | ORAL | Status: DC | PRN

## 2023-04-02 NOTE — Assessment & Plan Note (Signed)
Patient has a history of pulmonary hypertension as determined on echocardiogram. No prior history of right heart cath.  Given predominantly right-sided symptoms of heart failure today, repeat echocardiogram has been ordered.  - Echocardiogram ordered

## 2023-04-02 NOTE — Assessment & Plan Note (Signed)
At this time, patient is predominantly rate controlled.  She has not not on any rate controlling agents  - Continue home Xarelto

## 2023-04-02 NOTE — ED Provider Notes (Signed)
[facility] Provider Note    Event Date/Time   First MD Initiated Contact with Patient 04/02/23 1623     (approximate)   History   Chief Complaint Leg Swelling   HPI  Joan Ingram is a 89 y.o. female with past medical history of hypertension, atrial fibrillation on Xarelto, diastolic CHF, iron deficiency anemia, and pulmonary hypertension who presents to the ED complaining of leg swelling.  Patient reports that she has been dealing with increasing swelling in both of her legs for a couple of weeks now and it has become difficult for her to walk because of how swollen the legs are.  She reports some dyspnea on exertion but denies any difficulty breathing at rest.  She has not had any fevers, cough, or pain in her chest.  She denies any pain in her legs, only states that they feel very heavy with fluid.  She states she has been compliant with her diuretic medication.  She currently resides at an independent living nursing facility.     Physical Exam   Triage Vital Signs: ED Triage Vitals [04/02/23 1620]  Encounter Vitals Group     BP      Systolic BP Percentile      Diastolic BP Percentile      Pulse      Resp      Temp      Temp src      SpO2      Weight      Height      Head Circumference      [facility] Flow      Pain Score 0     Pain Loc      Pain Education      Exclude from Growth Chart     Most recent vital signs: Vitals:   04/02/23 1730 04/02/23 1830  BP: 92/64 (!) 102/55  Pulse: 92 98  Resp: 20 18  Temp:    SpO2: 98% 100%    Constitutional: Alert and oriented. Eyes: Conjunctivae are normal. Head: Atraumatic. Nose: No congestion/rhinnorhea. Mouth/Throat: Mucous membranes are moist.  Cardiovascular: Normal rate, irregularly irregular rhythm. Grossly normal heart sounds.  2+ radial pulses bilaterally. Respiratory: Normal respiratory effort.  No retractions. Lungs with crackles to bilateral bases. Gastrointestinal: Soft and  nontender. No distention. Musculoskeletal: No lower extremity tenderness, 3+ pitting edema to mid thighs bilaterally. Neurologic:  Normal speech and language. No gross focal neurologic deficits are appreciated.    ED Results / Procedures / Treatments   Labs (all labs ordered are listed, but only abnormal results are displayed) Labs Reviewed  CBC WITH DIFFERENTIAL/PLATELET - Abnormal; Notable for the following components:      Result Value   RBC 2.97 (*)    Hemoglobin 8.6 (*)    HCT 27.3 (*)    RDW 16.4 (*)    All other components within normal limits  COMPREHENSIVE METABOLIC PANEL - Abnormal; Notable for the following components:   Sodium 133 (*)    Glucose, Bld 111 (*)    Creatinine, Ser 1.15 (*)    Calcium 8.7 (*)    Albumin 2.7 (*)    GFR, Estimated 45 (*)    All other components within normal limits  BRAIN NATRIURETIC PEPTIDE - Abnormal; Notable for the following components:   B Natriuretic Peptide 160.6 (*)    All other components within normal limits  TROPONIN I (HIGH SENSITIVITY) - Abnormal; Notable for the following components:   Troponin I (  High Sensitivity) 20 (*)    All other components within normal limits  TROPONIN I (HIGH SENSITIVITY) - Abnormal; Notable for the following components:   Troponin I (High Sensitivity) 19 (*)    All other components within normal limits     EKG  ED ECG REPORT I, Chesley Noon, the attending physician, personally viewed and interpreted this ECG.   Date: 04/02/2023  EKG Time: 16:26  Rate: 94  Rhythm: atrial fibrillation, isolated PVC  Axis: RAD  Intervals:none  ST&T Change: None  RADIOLOGY Chest x-ray reviewed and interpreted by me with no infiltrate, edema, or effusion.  PROCEDURES:  Critical Care performed: No  Procedures   MEDICATIONS ORDERED IN ED: Medications  iohexol (OMNIPAQUE) 300 MG/ML solution 75 mL (60 mLs Intravenous Contrast Given 04/02/23 1758)  furosemide (LASIX) injection 40 mg (40 mg Intravenous  Given 04/02/23 1924)     IMPRESSION / MDM / ASSESSMENT AND PLAN / ED COURSE  I reviewed the triage vital signs and the nursing notes.                              89 y.o. female with past medical history of hypertension, atrial fibrillation on Xarelto, diastolic CHF, iron deficiency anemia, and pulmonary hypertension who presents to the ED complaining of increasing swelling in both of her legs over the past 2 weeks with some dyspnea on exertion.  Patient's presentation is most consistent with acute presentation with potential threat to life or bodily function.  Differential diagnosis includes, but is not limited to, CHF exacerbation, DVT, cellulitis, pneumonia, ACS, AKI, electrolyte abnormality, anemia.  Patient nontoxic-appearing and in no acute distress, vital signs are unremarkable.  EKG shows atrial fibrillation which is chronic for patient, no ischemic changes noted.  Labs and chest x-ray are pending at this time, suspect she would benefit from IV diuresis given profound swelling to her legs and difficulty walking due to this.  Labs show mild AKI without acute electrolyte abnormality, troponin mildly elevated but stable compared to previous.  Anemia is also stable, no significant leukocytosis noted.  LFTs are unremarkable.  Chest x-ray shows questionable pulmonary nodule and this was further assessed with CT imaging, which is remarkable for hiatal hernia but otherwise reassuring.  Given patient's significant lower extremity dyspnea and difficulty ambulating due to this, we will diurese with IV Lasix and case discussed with hospitalist for admission.      FINAL CLINICAL IMPRESSION(S) / ED DIAGNOSES   Final diagnoses:  Peripheral edema  Acute on chronic congestive heart failure, unspecified heart failure type (HCC)     Rx / DC Orders   ED Discharge Orders     None        Note:  This document was prepared using Dragon voice recognition software and may include unintentional  dictation errors.   Chesley Noon, MD 04/02/23 1945

## 2023-04-02 NOTE — Assessment & Plan Note (Signed)
Patient has a history of hypertension which is now essentially well-controlled with torsemide only.  Blood pressure at this time remains within normal range, however on the lower end of normal.

## 2023-04-02 NOTE — H&P (Addendum)
History and Physical    Patient: Joan Ingram ZYS:[id number] DOB: 31-May-1930 DOA: 04/02/2023 DOS: the patient was seen and examined on 04/02/2023 PCP: Joan Baas, MD  Patient coming from: ALF/ILF  Chief Complaint:  Chief Complaint  Patient presents with   Leg Swelling   HPI: Joan Ingram is a 89 y.o. female with medical history significant of paroxysmal A-fib on Xarelto, HFpEF, iron deficiency anemia, pulmonary hypertension, essential hypertension, osteoporosis, asthma, who presents to the ED due to leg swelling.  Joan Ingram dates that over the last couple weeks, she has had gradually worsening lower extremity swelling that has made it difficult for her to ambulate.  Due to this, she has been walking very little.  She is uncertain if she has had dyspnea on exertion as she has not walked recently.  She denies any shortness of breath at rest, chest pain, palpitations.  She denies any recent illness, fever, chills, nausea, vomiting, diarrhea, abdominal pain, cough or rhinorrhea.  She has been taking her daily torsemide without improvement.  ED course: On arrival to the ED, patient pressure was on the lower range of normal at 102/67 with heart rate of 90.  She was saturating at 97% on room air.  She was afebrile and 97.8. Small initial workup demonstrated hemoglobin of 8.6, sodium of 133, glucose 111, BUN 20, creatinine 1.15, albumin 2.7, GFR 45.  BNP elevated at 160.  Troponin negative x 2.  CT of the chest was obtained and that demonstrated COPD and bilateral small linear patchy densities, likely atelectasis versus scarring versus pneumonia.  Patient started on IV Lasix and [facility] contacted for admission.  Review of Systems: As mentioned in the history of present illness. All other systems reviewed and are negative.  Past Medical History:  Diagnosis Date   Asthma    Esophageal reflux    Essential hypertension    IDA (iron deficiency anemia)    Paroxysmal atrial  fibrillation (HCC)    Senile osteoporosis    Past Surgical History:  Procedure Laterality Date   CARDIOVERSION N/A 04/21/2020   Procedure: CARDIOVERSION;  Surgeon: Dalia Heading, MD;  Location: [facility] ORS;  Service: Cardiovascular;  Laterality: N/A;   colonoscopy     Social History:  reports that she has never smoked. She has never used smokeless tobacco. She reports that she does not drink alcohol. No history on file for drug use.  Allergies  Allergen Reactions   Other Anaphylaxis   Aspirin    Astemizole Other (See Comments)   Benadryl [Diphenhydramine Hcl]    Ciprofloxacin    Codeine    Doxycycline    Fexofenadine Other (See Comments)   Lansoprazole Diarrhea   Levofloxacin Swelling   Loratadine Other (See Comments)   Macrobid [Nitrofurantoin Macrocrystal]    Morphine And Codeine    Omeprazole Diarrhea   Oxycodone    Pantoprazole Diarrhea   Penicillins    Rabeprazole Diarrhea   Terfenadine Swelling    History reviewed. No pertinent family history.  Prior to Admission medications   Medication Sig Start Date End Date Taking? Authorizing Provider  albuterol (VENTOLIN HFA) 108 (90 Base) MCG/ACT inhaler Inhale 2 puffs into the lungs every 6 (six) hours as needed for wheezing or shortness of breath.    [provider]  alum & mag hydroxide-simeth (MAALOX/MYLANTA) 200-200-20 MG/5ML suspension Take 30 mLs by mouth every 4 (four) hours as needed for indigestion or heartburn. 01/10/23   Dimple Nanas, MD  fluticasone-salmeterol (ADVAIR) 250-50 MCG/ACT AEPB  Inhale 1 puff into the lungs every 12 (twelve) hours. 12/29/22 12/29/23  [provider]  ondansetron (ZOFRAN-ODT) 4 MG disintegrating tablet Take 1 tablet (4 mg total) by mouth every 8 (eight) hours as needed for nausea or vomiting. 07/24/22   Corena Herter, MD  pantoprazole (PROTONIX) 40 MG tablet Take 1 tablet (40 mg total) by mouth 2 (two) times daily before a meal. 01/10/23   Amin, Loura Halt, MD   potassium chloride (KLOR-CON) 10 MEQ tablet Take 1 tablet (10 mEq total) by mouth 2 (two) times daily for 5 days. 02/28/23 03/05/23  Dionne Bucy, MD  torsemide (DEMADEX) 20 MG tablet Take 20 mg by mouth daily.    [provider]    Physical Exam: Vitals:   04/02/23 1900 04/02/23 1930 04/02/23 2000 04/02/23 2026  BP: 111/64 98/71 94/80    Pulse: (!) 58 93 97   Resp: 17 20 17    Temp:    97.9 F (36.6 C)  TempSrc:    Oral  SpO2: 100% 98% 91%   Height:       Physical Exam Vitals and nursing note reviewed.  Constitutional:      General: She is not in acute distress. HENT:     Head: Normocephalic and atraumatic.     Mouth/Throat:     Mouth: Mucous membranes are moist.     Pharynx: Oropharynx is clear.  Eyes:     Conjunctiva/sclera: Conjunctivae normal.     Pupils: Pupils are equal, round, and reactive to light.  Neck:     Vascular: JVD present.  Cardiovascular:     Rate and Rhythm: Normal rate. Rhythm irregular.     Heart sounds: No murmur heard.    Comments: Pitting edema extends to the knees and thighs Pulmonary:     Effort: Pulmonary effort is normal. No respiratory distress.     Breath sounds: Normal breath sounds. No wheezing, rhonchi or rales.  Abdominal:     General: Bowel sounds are normal. There is no distension.     Palpations: Abdomen is soft.     Tenderness: There is no abdominal tenderness. There is no guarding.  Musculoskeletal:     Right lower leg: 2+ Pitting Edema present.     Left lower leg: 2+ Pitting Edema present.  Skin:    General: Skin is warm and dry.     Coloration: Skin is pale.  Neurological:     Mental Status: She is alert and oriented to person, place, and time.     Comments: Bilateral lower extremity weakness noted that is equal  Psychiatric:        Mood and Affect: Mood normal.        Behavior: Behavior normal.    Data Reviewed: CBC with WBC of 5.8, hemoglobin 8.6, MCV of 91 and platelets of 374 CMP with sodium of 133,  potassium 3.9, bicarb 26, glucose 111, creatinine 1.15, anion gap 9, albumin 2.7, AST 25, ALT 12 and GFR 45 BNP is 160 Troponin negative x 2 at 2019  CT Chest W Contrast  Result Date: 04/02/2023 CLINICAL DATA:  Shortness of breath, edema in lower extremities EXAM: CT CHEST WITH CONTRAST TECHNIQUE: Multidetector CT imaging of the chest was performed during intravenous contrast administration. RADIATION DOSE REDUCTION: This exam was performed according to the departmental dose-optimization program which includes automated exposure control, adjustment of the mA and/or kV according to patient size and/or use of iterative reconstruction technique. CONTRAST:  60mL OMNIPAQUE IOHEXOL 300 MG/ML  SOLN COMPARISON:  Chest radiograph done today FINDINGS: Cardiovascular: There are scattered calcifications in thoracic aorta. Coronary artery calcifications are seen. Mediastinum/Nodes: No significant lymphadenopathy is seen. Large fixed hiatal hernia is seen. Lungs/Pleura: Increase in the AP 6 diameter of chest suggests COPD. Small linear patchy infiltrate is seen in medial left lower lung field. Small linear densities are seen in medial lower lung fields. There is no focal pulmonary consolidation. Possible nodular density described in the lower lung field in the chest radiographs could not be identified in the lung fields in the CT. There is no pleural effusion or pneumothorax. There is mild ectasia of bronchi in right middle lobe. Upper Abdomen: There are few small myelinated low-density lesions in liver largest measuring 3.9 cm in size suggesting multiple hepatic cysts. Musculoskeletal: No acute findings are seen. There are numerous small calcifications in both breasts. IMPRESSION: COPD. Small linear patchy densities are seen in both lower lung fields suggesting subsegmental atelectasis or scarring or pneumonia. There is no discrete focal lung nodule in left lower lung field. There is no focal pulmonary consolidation. There  is no pleural effusion or pneumothorax. Coronary artery calcifications are seen.  Aortic arteriosclerosis. Large fixed hiatal hernia.  Multiple hepatic cysts. Electronically Signed   By: Ernie Avena M.D.   On: 04/02/2023 18:32   DG Chest 2 View  Result Date: 04/02/2023 CLINICAL DATA:  Shortness of breath EXAM: CHEST - 2 VIEW COMPARISON:  Chest x-ray dated February 28, 2019 FINDINGS: Cardiac and mediastinal contours are unchanged. Large hiatal hernia. Eventration of the right hemidiaphragm. Indeterminate infrahilar nodular opacity seen on lateral view. No pleural effusion. IMPRESSION: 1. Indeterminate infrahilar nodular opacity seen on lateral view, could be due to redundant mucosa related to large hiatal hernia, although pulmonary nodule can not be excluded. Recommend further evaluation with contrast-enhanced chest CT. 2. Large hiatal hernia. Electronically Signed   By: Allegra Lai M.D.   On: 04/02/2023 17:13    Results are pending, will review when available.  Assessment and Plan:  * Acute on chronic heart failure with preserved ejection fraction (HFpEF) (HCC) Patient is presenting with couple week history of worsening lower extremity edema that has led to inability to ambulate.  Last echocardiogram obtained in 2022 demonstrated preserved LV and RV systolic function with LVEF of 50%.  Since last echocardiogram, patient has had severe anemia, which may have worsened cardiac function.  - Telemetry monitoring - Lasix 40 mg IV daily - Echocardiogram ordered - Strict in and out - Daily weights - Daily monitoring of potassium and magnesium  Moderate pulmonary hypertension (HCC) Patient has a history of pulmonary hypertension as determined on echocardiogram. No prior history of right heart cath.  Given predominantly right-sided symptoms of heart failure today, repeat echocardiogram has been ordered.  - Echocardiogram ordered  Paroxysmal atrial fibrillation (HCC) At this time, patient is  predominantly rate controlled.  She has not not on any rate controlling agents  - Continue home Xarelto  Benign essential hypertension Patient has a history of hypertension which is now essentially well-controlled with torsemide only.  Blood pressure at this time remains within normal range, however on the lower end of normal.  Anemia Per chart review, patient has a history of iron deficiency anemia with hemoglobin ranging between 9-10.  In April 2024, she was admitted with a GI bleed at which time her hemoglobin was found to be 4.8.  It seems her hemoglobin has improved back up to near her baseline at 8.6 today.  - Continue outpatient follow-up  with PCP - Monitor CBC while admitted  Asthma, chronic - Continue home bronchodilators  Advance Care Planning:   Code Status: DNR/DNI.  Patient confirmed CODE STATUS and MOST form reviewed  Consults: None  Family Communication: No family at bedside  Severity of Illness: The appropriate patient status for this patient is INPATIENT. Inpatient status is judged to be reasonable and necessary in order to provide the required intensity of service to ensure the patient's safety. The patient's presenting symptoms, physical exam findings, and initial radiographic and laboratory data in the context of their chronic comorbidities is felt to place them at high risk for further clinical deterioration. Furthermore, it is not anticipated that the patient will be medically stable for discharge from the hospital within 2 midnights of admission.   * I certify that at the point of admission it is my clinical judgment that the patient will require inpatient hospital care spanning beyond 2 midnights from the point of admission due to high intensity of service, high risk for further deterioration and high frequency of surveillance required.*  Author: Verdene Lennert, MD 04/02/2023 9:30 PM  For on call review [email or web address].

## 2023-04-02 NOTE — Assessment & Plan Note (Signed)
Per chart review, patient has a history of iron deficiency anemia with hemoglobin ranging between 9-10.  In April 2024, she was admitted with a GI bleed at which time her hemoglobin was found to be 4.8.  It seems her hemoglobin has improved back up to near her baseline at 8.6 today.  - Continue outpatient follow-up with PCP - Monitor CBC while admitted

## 2023-04-02 NOTE — Assessment & Plan Note (Signed)
-  Continue home bronchodilators 

## 2023-04-02 NOTE — Assessment & Plan Note (Signed)
Patient is presenting with couple week history of worsening lower extremity edema that has led to inability to ambulate.  Last echocardiogram obtained in 2022 demonstrated preserved LV and RV systolic function with LVEF of 50%.  Since last echocardiogram, patient has had severe anemia, which may have worsened cardiac function.  - Telemetry monitoring - Lasix 40 mg IV daily - Echocardiogram ordered - Strict in and out - Daily weights - Daily monitoring of potassium and magnesium

## 2023-04-02 NOTE — ED Triage Notes (Signed)
EMS called out to [facility] for shortness of breath with exertion and increased bilateral leg swelling.

## 2023-04-03 ENCOUNTER — Inpatient Hospital Stay
Admit: 2023-04-03 | Discharge: 2023-04-03 | Disposition: A | Discharge status: Home / Self Care | Payer: PPO | Attending: Internal Medicine | Admitting: Internal Medicine

## 2023-04-03 DIAGNOSIS — I5033 Acute on chronic diastolic (congestive) heart failure: Secondary | ICD-10-CM | POA: Diagnosis not present

## 2023-04-03 LAB — CBC WITH DIFFERENTIAL/PLATELET
Abs Immature Granulocytes: 0.02 10*3/uL (ref 0.00–0.07)
Basophils Absolute: 0 10*3/uL (ref 0.0–0.1)
Basophils Relative: 1 %
Eosinophils Absolute: 0.2 10*3/uL (ref 0.0–0.5)
Eosinophils Relative: 3 %
HCT: 25.3 % — ABNORMAL LOW (ref 36.0–46.0)
Hemoglobin: 8 g/dL — ABNORMAL LOW (ref 12.0–15.0)
Immature Granulocytes: 0 %
Lymphocytes Relative: 18 %
Lymphs Abs: 1.1 10*3/uL (ref 0.7–4.0)
MCH: 28.7 pg (ref 26.0–34.0)
MCHC: 31.6 g/dL (ref 30.0–36.0)
MCV: 90.7 fL (ref 80.0–100.0)
Monocytes Absolute: 0.7 10*3/uL (ref 0.1–1.0)
Monocytes Relative: 12 %
Neutro Abs: 3.9 10*3/uL (ref 1.7–7.7)
Neutrophils Relative %: 66 %
Platelets: 357 10*3/uL (ref 150–400)
RBC: 2.79 MIL/uL — ABNORMAL LOW (ref 3.87–5.11)
RDW: 16.2 % — ABNORMAL HIGH (ref 11.5–15.5)
WBC: 5.9 10*3/uL (ref 4.0–10.5)
nRBC: 0 % (ref 0.0–0.2)

## 2023-04-03 LAB — BASIC METABOLIC PANEL
Anion gap: 9 (ref 5–15)
BUN: 18 mg/dL (ref 8–23)
CO2: 25 mmol/L (ref 22–32)
Calcium: 8.3 mg/dL — ABNORMAL LOW (ref 8.9–10.3)
Chloride: 102 mmol/L (ref 98–111)
Creatinine, Ser: 1.01 mg/dL — ABNORMAL HIGH (ref 0.44–1.00)
GFR, Estimated: 53 mL/min — ABNORMAL LOW (ref >60)
Glucose, Bld: 93 mg/dL (ref 70–99)
Potassium: 3.3 mmol/L — ABNORMAL LOW (ref 3.5–5.1)
Sodium: 136 mmol/L (ref 135–145)

## 2023-04-03 LAB — MAGNESIUM: Magnesium: 1.9 mg/dL (ref 1.7–2.4)

## 2023-04-03 MED ORDER — POTASSIUM CHLORIDE CRYS ER 20 MEQ PO TBCR
40.0000 meq | EXTENDED_RELEASE_TABLET | ORAL | Status: AC
  Administered 2023-04-03 (×2): 40 meq via ORAL
  Filled 2023-04-03 (×2): qty 2

## 2023-04-03 MED ORDER — MAGNESIUM SULFATE 2 GM/50ML IV SOLN
2.0000 g | Freq: Once | INTRAVENOUS | Status: AC
  Administered 2023-04-03: 2 g via INTRAVENOUS
  Filled 2023-04-03: qty 50

## 2023-04-03 MED ORDER — FUROSEMIDE 10 MG/ML IJ SOLN
40.0000 mg | Freq: Two times a day (BID) | INTRAMUSCULAR | Status: DC
  Administered 2023-04-03 – 2023-04-04 (×3): 40 mg via INTRAVENOUS
  Filled 2023-04-03 (×3): qty 4

## 2023-04-03 NOTE — Progress Notes (Signed)
*  PRELIMINARY RESULTS* Echocardiogram 2D Echocardiogram has been performed.  [name] 04/03/2023, 3:47 PM

## 2023-04-03 NOTE — Progress Notes (Signed)
PROGRESS NOTE    Joan Ingram  WUJ:[id number] DOB: 17-Mar-1930 DOA: 04/02/2023 PCP: Enid Baas, MD    Brief Narrative:  89 y.o. female with medical history significant of paroxysmal A-fib on Xarelto, HFpEF, iron deficiency anemia, pulmonary hypertension, essential hypertension, osteoporosis, asthma, who presents to the ED due to leg swelling.   Joan Ingram dates that over the last couple weeks, she has had gradually worsening lower extremity swelling that has made it difficult for her to ambulate.  Due to this, she has been walking very little.  She is uncertain if she has had dyspnea on exertion as she has not walked recently.  She denies any shortness of breath at rest, chest pain, palpitations.  She denies any recent illness, fever, chills, nausea, vomiting, diarrhea, abdominal pain, cough or rhinorrhea.  She has been taking her daily torsemide without improvement.   Assessment & Plan:   Principal Problem:   Acute on chronic heart failure with preserved ejection fraction (HFpEF) (HCC) Active Problems:   Moderate pulmonary hypertension (HCC)   Paroxysmal atrial fibrillation (HCC)   Benign essential hypertension   Anemia   Asthma, chronic   Acute on chronic heart failure with preserved ejection fraction (HFpEF) (HCC) Patient is presenting with couple week history of worsening lower extremity edema that has led to inability to ambulate.  Last echocardiogram obtained in 2022 demonstrated preserved LV and RV systolic function with LVEF of 50%.  Since last echocardiogram, patient has had severe anemia, which may have worsened cardiac function. Plan: Increase Lasix 40 mg IV twice daily Follow-up 2D echocardiogram Ins and outs, daily weights Target net -1-1.5 L daily Follow electrolytes.  Goal potassium greater than 4, magnesium greater than 2   Moderate pulmonary hypertension (HCC) Patient has a history of pulmonary hypertension as determined on echocardiogram. No prior  history of right heart cath.  Given predominantly right-sided symptoms of heart failure today, repeat echocardiogram has been ordered. - Echocardiogram ordered   Paroxysmal atrial fibrillation (HCC) At this time, patient is predominantly rate controlled.  She has not not on any rate controlling agents - Continue home Xarelto   Benign essential hypertension Patient has a history of hypertension which is now essentially well-controlled with torsemide only.  Blood pressure at this time remains within normal range, however on the lower end of normal.  Caution while on IV Lasix   Anemia Per chart review, patient has a history of iron deficiency anemia with hemoglobin ranging between 9-10.  In April 2024, she was admitted with a GI bleed at which time her hemoglobin was found to be 4.8.  It seems her hemoglobin has improved back up to near her baseline at 8.6 today. - Continue outpatient follow-up with PCP - Monitor CBC while admitted   Asthma, chronic - Continue home bronchodilators  Debility Functional decline Patient currently in independent living.  Family concerned about patient's safety at home.  Will benefit from skilled nursing facility placement.  TOC consulted.   DVT prophylaxis: Xarelto Code Status: DNR Family Communication: None today Disposition Plan: Status is: Inpatient Remains inpatient appropriate because: Decompensated heart failure   Level of care: Telemetry Medical  Consultants:  None  Procedures:  None  Antimicrobials: None    Subjective: Seen and examined.  Resting comfortably in bed.  No visible distress.  Complains of pain in bilateral knees.  Objective: Vitals:   04/03/23 0745 04/03/23 0900 04/03/23 1000 04/03/23 1148  BP:  109/71 97/67 (!) 95/57  Pulse:  (!) 133 (!) 119  94  Resp: 20   18  Temp:    97.9 F (36.6 C)  TempSrc:    Oral  SpO2:  93% 94% 99%  Weight:      Height:        Intake/Output Summary (Last 24 hours) at 04/03/2023  1328 Last data filed at 04/02/2023 2140 Gross per 24 hour  Intake --  Output 500 ml  Net -500 ml   Filed Weights   04/02/23 2139  Weight: 76.4 kg    Examination:  General exam: Appears calm and comfortable  Respiratory system: Bibasilar crackles.  Normal work of breathing.  Room air Cardiovascular system: S1-S2, RRR, no murmurs, 3+ pitting edema BLE Gastrointestinal system: Soft, NT/ND Central nervous system: Alert and oriented. No focal neurological deficits. Extremities: Symmetric 5 x 5 power. Skin: No rashes, lesions or ulcers Psychiatry: Judgement and insight appear normal. Mood & affect appropriate.     Data Reviewed: I have personally reviewed following labs and imaging studies  CBC: Recent Labs  Lab 04/02/23 1625 04/03/23 0602  WBC 5.8 5.9  NEUTROABS 3.9 3.9  HGB 8.6* 8.0*  HCT 27.3* 25.3*  MCV 91.9 90.7  PLT 374 357   Basic Metabolic Panel: Recent Labs  Lab 04/02/23 1625 04/03/23 0602  NA 133* 136  K 3.9 3.3*  CL 98 102  CO2 26 25  GLUCOSE 111* 93  BUN 20 18  CREATININE 1.15* 1.01*  CALCIUM 8.7* 8.3*  MG  --  1.9   GFR: Estimated Creatinine Clearance: 33.8 mL/min (A) (by C-G formula based on SCr of 1.01 mg/dL (H)). Liver Function Tests: Recent Labs  Lab 04/02/23 1625  AST 25  ALT 12  ALKPHOS 55  BILITOT 0.6  PROT 6.7  ALBUMIN 2.7*   No results for input(s): "LIPASE", "AMYLASE" in the last 168 hours. No results for input(s): "AMMONIA" in the last 168 hours. Coagulation Profile: No results for input(s): "INR", "PROTIME" in the last 168 hours. Cardiac Enzymes: No results for input(s): "CKTOTAL", "CKMB", "CKMBINDEX", "TROPONINI" in the last 168 hours. BNP (last 3 results) No results for input(s): "PROBNP" in the last 8760 hours. HbA1C: No results for input(s): "HGBA1C" in the last 72 hours. CBG: No results for input(s): "GLUCAP" in the last 168 hours. Lipid Profile: No results for input(s): "CHOL", "HDL", "LDLCALC", "TRIG", "CHOLHDL",  "LDLDIRECT" in the last 72 hours. Thyroid Function Tests: No results for input(s): "TSH", "T4TOTAL", "FREET4", "T3FREE", "THYROIDAB" in the last 72 hours. Anemia Panel: No results for input(s): "VITAMINB12", "FOLATE", "FERRITIN", "TIBC", "IRON", "RETICCTPCT" in the last 72 hours. Sepsis Labs: No results for input(s): "PROCALCITON", "LATICACIDVEN" in the last 168 hours.  No results found for this or any previous visit (from the past 240 hour(s)).       Radiology Studies: CT Chest W Contrast  Result Date: 04/02/2023 CLINICAL DATA:  Shortness of breath, edema in lower extremities EXAM: CT CHEST WITH CONTRAST TECHNIQUE: Multidetector CT imaging of the chest was performed during intravenous contrast administration. RADIATION DOSE REDUCTION: This exam was performed according to the departmental dose-optimization program which includes automated exposure control, adjustment of the mA and/or kV according to patient size and/or use of iterative reconstruction technique. CONTRAST:  60mL OMNIPAQUE IOHEXOL 300 MG/ML  SOLN COMPARISON:  Chest radiograph done today FINDINGS: Cardiovascular: There are scattered calcifications in thoracic aorta. Coronary artery calcifications are seen. Mediastinum/Nodes: No significant lymphadenopathy is seen. Large fixed hiatal hernia is seen. Lungs/Pleura: Increase in the AP 6 diameter of chest suggests COPD. Small linear  patchy infiltrate is seen in medial left lower lung field. Small linear densities are seen in medial lower lung fields. There is no focal pulmonary consolidation. Possible nodular density described in the lower lung field in the chest radiographs could not be identified in the lung fields in the CT. There is no pleural effusion or pneumothorax. There is mild ectasia of bronchi in right middle lobe. Upper Abdomen: There are few small myelinated low-density lesions in liver largest measuring 3.9 cm in size suggesting multiple hepatic cysts. Musculoskeletal: No  acute findings are seen. There are numerous small calcifications in both breasts. IMPRESSION: COPD. Small linear patchy densities are seen in both lower lung fields suggesting subsegmental atelectasis or scarring or pneumonia. There is no discrete focal lung nodule in left lower lung field. There is no focal pulmonary consolidation. There is no pleural effusion or pneumothorax. Coronary artery calcifications are seen.  Aortic arteriosclerosis. Large fixed hiatal hernia.  Multiple hepatic cysts. Electronically Signed   By: Ernie Avena M.D.   On: 04/02/2023 18:32   DG Chest 2 View  Result Date: 04/02/2023 CLINICAL DATA:  Shortness of breath EXAM: CHEST - 2 VIEW COMPARISON:  Chest x-ray dated February 28, 2019 FINDINGS: Cardiac and mediastinal contours are unchanged. Large hiatal hernia. Eventration of the right hemidiaphragm. Indeterminate infrahilar nodular opacity seen on lateral view. No pleural effusion. IMPRESSION: 1. Indeterminate infrahilar nodular opacity seen on lateral view, could be due to redundant mucosa related to large hiatal hernia, although pulmonary nodule can not be excluded. Recommend further evaluation with contrast-enhanced chest CT. 2. Large hiatal hernia. Electronically Signed   By: Allegra Lai M.D.   On: 04/02/2023 17:13        Scheduled Meds:  furosemide  40 mg Intravenous Q12H   mometasone-formoterol  2 puff Inhalation BID   pantoprazole  40 mg Oral BID AC   Rivaroxaban  15 mg Oral Q supper   sodium chloride flush  3 mL Intravenous Q12H   Continuous Infusions:  sodium chloride       LOS: 1 day   Tresa Moore, MD [facility]   If 7PM-7AM, please contact night-coverage  04/03/2023, 1:28 PM

## 2023-04-03 NOTE — Evaluation (Signed)
Occupational Therapy Evaluation Patient Details Name: Joan Ingram MRN: [id number] DOB: 08-Jun-1930 Today's Date: 04/03/2023   History of Present Illness Pt is a 89 y.o. female with PMH consisting of Paroxysmal A-fib on Xarelto, iron deficiency anemia, pulmonary hypertension, essential hypertension, osteoporosis, asthma. Pt admitted to ED 7/15 with reports of gradually worsening LE swelling. MD assessment includes: acute on chronic HFpEF.   Clinical Impression   Pt was seen for OT evaluation this date. Prior to hospital admission, pt was MOD I for ADL/functional mobility using rollator. Pt lives at [facility], staff can assist PRN. She was cooking occasionally, but preferred to have meals brought to room. Requires assist for driving. Pt presents to acute OT demonstrating impaired ADL performance and functional mobility 2/2 bilateral LE edema, generalized weakness and varied HR.  (See OT problem list for additional functional deficits). HR 96, O2 97% on RA. HR varied throughout session, increasing to 100-146 with transfer, at rest 95-115. Pt currently requires min guard for functional transfers using RW (increased cuing), setup for seated EOB grooming tasks and anticipate MOD-MAX for LB dressing d/t edema.  Pt left in recliner, needs within reach, RN notified of mobility, position, and HR data. Pt would benefit from skilled OT services to address noted impairments and functional limitations (see below for any additional details) in order to maximize safety and independence while minimizing falls risk and caregiver burden.      Recommendations for follow up therapy are one component of a multi-disciplinary discharge planning process, led by the attending physician.  Recommendations may be updated based on patient status, additional functional criteria and insurance authorization.   Assistance Recommended at Discharge Intermittent Supervision/Assistance  Patient can return home with the following A  little help with walking and/or transfers;A little help with bathing/dressing/bathroom;Direct supervision/assist for medications management;Direct supervision/assist for financial management;Help with stairs or ramp for entrance;Assist for transportation    Functional Status Assessment  Patient has had a recent decline in their functional status and demonstrates the ability to make significant improvements in function in a reasonable and predictable amount of time.  Equipment Recommendations  None recommended by OT    Recommendations for Other Services       Precautions / Restrictions Precautions Precautions: Fall Restrictions Weight Bearing Restrictions: No      Mobility Bed Mobility Overal bed mobility: Needs Assistance Bed Mobility: Supine to Sit, Sit to Supine     Supine to sit: Modified independent (Device/Increase time) Sit to supine: Min guard   General bed mobility comments: Min guard for supine > sit, sitting > supine min guard and increased time    Transfers Overall transfer level: Needs assistance Equipment used: Rolling walker (2 wheels) Transfers: Bed to chair/wheelchair/BSC, Sit to/from Stand Sit to Stand: Min guard     Step pivot transfers: Min guard     General transfer comment: Cues for hand placement, difficulties using RW since pt uses rollator baseline      Balance Overall balance assessment: Needs assistance Sitting-balance support: Feet supported, No upper extremity supported Sitting balance-Leahy Scale: Good     Standing balance support: Reliant on assistive device for balance, Bilateral upper extremity supported, During functional activity Standing balance-Leahy Scale: Fair Standing balance comment: Transfers with no LOB                           ADL either performed or assessed with clinical judgement   ADL Overall ADL's : Needs assistance/impaired  Grooming: Wash/dry hands;Wash/dry face;Oral care;Set up;Sitting                    Toilet Transfer: Hydrographic surveyor Details (indicate cue type and reason): Simulated toilet transfer EOB > recliner         Functional mobility during ADLs: Minimal assistance;Rolling walker (2 wheels) General ADL Comments: Grooming task sitting EOB, no LOB, setup overall, anticipate MAX-MOD A for LB ADLs d/t BLE swelling. UB ADLs minA     Vision Baseline Vision/History: 1 Wears glasses              Pertinent Vitals/Pain Pain Assessment Pain Assessment: No/denies pain        Extremity/Trunk Assessment Upper Extremity Assessment Upper Extremity Assessment: Generalized weakness   Lower Extremity Assessment Lower Extremity Assessment: Generalized weakness (Significant BLE swelling)       Communication Communication Communication: HOH   Cognition Arousal/Alertness: Awake/alert Behavior During Therapy: WFL for tasks assessed/performed Overall Cognitive Status: Within Functional Limits for tasks assessed                                       General Comments  LE edema present bilaterally. HR varied between 145-90    Exercises     Shoulder Instructions      Home Living Family/patient expects to be discharged to:: Private residence [facility]) Living Arrangements: Alone Available Help at Discharge: Family;Other (Comment) Counselling psychologist at [facility]) Type of Home: Independent living facility       Home Layout: One level     Bathroom Shower/Tub: Sponge bathes at baseline   Bathroom Toilet: Standard Bathroom Accessibility: Yes   Home Equipment: Rollator (4 wheels);Grab bars - toilet          Prior Functioning/Environment Prior Level of Function : Independent/Modified Independent             Mobility Comments: Mod I with rollator for all mobility; pt described household ambulator in past few weeks, community amb. before that. No falls history. ADLs Comments: Independent with all ADL's, sponge bathing. Needs  assistance for driving.        OT Problem List: Decreased range of motion;Decreased activity tolerance;Impaired balance (sitting and/or standing);Increased edema      OT Treatment/Interventions: Self-care/ADL training;Therapeutic exercise;Energy conservation;DME and/or AE instruction;Therapeutic activities;Patient/family education;Balance training    OT Goals(Current goals can be found in the care plan section) Acute Rehab OT Goals OT Goal Formulation: With patient Time For Goal Achievement: 04/17/23 Potential to Achieve Goals: Good  OT Frequency: Min 1X/week       AM-PAC OT "6 Clicks" Daily Activity     Outcome Measure Help from another person eating meals?: None Help from another person taking care of personal grooming?: None Help from another person toileting, which includes using toliet, bedpan, or urinal?: A Little Help from another person bathing (including washing, rinsing, drying)?: A Lot Help from another person to put on and taking off regular upper body clothing?: A Little Help from another person to put on and taking off regular lower body clothing?: A Lot 6 Click Score: 18   End of Session Equipment Utilized During Treatment: Gait belt;Rolling walker (2 wheels) Nurse Communication: Mobility status;Other (comment) (HR)  Activity Tolerance: Patient tolerated treatment well Patient left: in chair;with call bell/phone within reach;with chair alarm set  OT Visit Diagnosis: Unsteadiness on feet (R26.81);Other abnormalities of gait and mobility (R26.89);Muscle weakness (  generalized) (M62.81)                Time: 8413-2440 OT Time Calculation (min): 32 min Charges:  OT General Charges $OT Visit: 1 Visit OT Evaluation $OT Eval Low Complexity: 1 Low OT Treatments $Self Care/Home Management : 8-22 mins Clora Ohmer L. Daleen Steinhaus, OTR/L  04/03/23, 2:32 PM   Lise Auer Kyrin Gratz 04/03/2023, 2:26 PM

## 2023-04-03 NOTE — Evaluation (Signed)
Physical Therapy Evaluation Patient Details Name: Joan Ingram MRN: [id number] DOB: 1929-11-25 Today's Date: 04/03/2023  History of Present Illness  Pt is a 89 y.o. female with PMH consisting of Paroxysmal A-fib on Xarelto, iron deficiency anemia, pulmonary hypertension, essential hypertension, osteoporosis, asthma. Pt admitted to ED (6/12) with reports of gradually worsening LE swelling. MD assessment includes: acute on chronic HFpEF.   Clinical Impression  Pt pleasant and motivated for therapy. Vitals at rest as follows: HR 96, O2 97% on room air. Pt able to complete sup-sit with Mod I. She was min guard for STS from bed with heavy use of UEs on RW. Pt able to ambulate ~20 feet in the room with min guard for safety and RW. She did not require physical assistance at any point but did seem shaky/unstable at certain points throughout the walk. She was unable to take large steps, mostly having to shuffle/slide her LE's across the floor in order to move forward. HR peaked around mid 150's after ambulation, however was highly variable throughout the entire session, ranging from 100-mid 150s, MD notified. After 20-30 seconds of seated rest, HR ranged from 100-115. O2 remained mid to high 90's no room air. Pt will benefit from continued PT services upon discharge to safely address deficits listed in patient problem list for decreased caregiver assistance and eventual return to PLOF.       Assistance Recommended at Discharge Intermittent Supervision/Assistance  If plan is discharge home, recommend the following:  Can travel by private vehicle  A little help with walking and/or transfers;A little help with bathing/dressing/bathroom;Assistance with cooking/housework;Assist for transportation;Help with stairs or ramp for entrance   No    Equipment Recommendations Other (comment) (TBD)  Recommendations for Other Services       Functional Status Assessment Patient has had a recent decline in  their functional status and demonstrates the ability to make significant improvements in function in a reasonable and predictable amount of time.     Precautions / Restrictions Precautions Precautions: Fall Restrictions Weight Bearing Restrictions: No      Mobility  Bed Mobility Overal bed mobility: Needs Assistance Bed Mobility: Supine to Sit, Sit to Supine     Supine to sit: Modified independent (Device/Increase time) Sit to supine: Max assist, +2 for physical assistance   General bed mobility comments: Mod I for sup-sit, pt has to use UEs to assist with moving LEs off bed. Max A + 2 for sit-sup at end of session; pt unable to get legs onto bed independently.    Transfers Overall transfer level: Needs assistance Equipment used: Rolling walker (2 wheels) Transfers: Sit to/from Stand Sit to Stand: Min guard           General transfer comment: No physical assistance or cueing needed for STS from bed. Heavy use of UEs on RW.    Ambulation/Gait Ambulation/Gait assistance: Min guard Gait Distance (Feet): 20 Feet Assistive device: Rolling walker (2 wheels) Gait Pattern/deviations: Decreased step length - right, Decreased step length - left, Shuffle Gait velocity: decreased     General Gait Details: Pt able to ambulate ~20 feet in room with min guard and RW for safety. Pt did not need physical assistance for ambulation but did appear unsteady/shaky at points; pt mostly unable to take large steps and had to slide her feet across the floor in order to move forward. VC's for proper RW placement.  Stairs            Psychologist, prison and probation services  Tilt Bed    Modified Rankin (Stroke Patients Only)       Balance Overall balance assessment: Needs assistance Sitting-balance support: Feet supported, No upper extremity supported Sitting balance-Leahy Scale: Good     Standing balance support: Reliant on assistive device for balance, Bilateral upper extremity supported,  During functional activity Standing balance-Leahy Scale: Fair Standing balance comment: No LOB noted while standing/ambulating however heavy use of RW throughout                             Pertinent Vitals/Pain Pain Assessment Pain Assessment: No/denies pain    Home Living Family/patient expects to be discharged to:: Private residence (At [facility]) Living Arrangements: Alone Available Help at Discharge: Family, Intermittent Type of Home: Independent living facility         Home Layout: One level Home Equipment: Rollator (4 wheels);Grab bars - toilet      Prior Function Prior Level of Function : Independent/Modified Independent             Mobility Comments: Mod I with rollator for all mobility; pt described household ambulator in past few weeks, community amb. before that. No falls history. ADLs Comments: Independent with all ADL's, sponge bathing. Needs assistance for driving.     Hand Dominance        Extremity/Trunk Assessment   Upper Extremity Assessment Upper Extremity Assessment: Defer to OT evaluation    Lower Extremity Assessment Lower Extremity Assessment: Generalized weakness       Communication      Cognition Arousal/Alertness: Awake/alert Behavior During Therapy: WFL for tasks assessed/performed Overall Cognitive Status: No family/caregiver present to determine baseline cognitive functioning                                 General Comments: Pt asked frequently what time it was throughout session.        General Comments General comments (skin integrity, edema, etc.): Significant LE edema present bilaterally    Exercises     Assessment/Plan    PT Assessment Patient needs continued PT services  PT Problem List Decreased strength;Decreased activity tolerance;Decreased balance;Decreased mobility;Decreased knowledge of use of DME       PT Treatment Interventions DME  instruction;Therapeutic exercise;Gait training;Balance training;Stair training;Functional mobility training;Therapeutic activities;Patient/family education    PT Goals (Current goals can be found in the Care Plan section)  Acute Rehab PT Goals Patient Stated Goal: Go home PT Goal Formulation: With patient Time For Goal Achievement: 04/16/23 Potential to Achieve Goals: Good    Frequency Min 1X/week     Co-evaluation               AM-PAC PT "6 Clicks" Mobility  Outcome Measure Help needed turning from your back to your side while in a flat bed without using bedrails?: A Little Help needed moving from lying on your back to sitting on the side of a flat bed without using bedrails?: A Little Help needed moving to and from a bed to a chair (including a wheelchair)?: A Little Help needed standing up from a chair using your arms (e.g., wheelchair or bedside chair)?: A Little Help needed to walk in hospital room?: A Little Help needed climbing 3-5 steps with a railing? : Total 6 Click Score: 16    End of Session Equipment Utilized During Treatment: Gait belt Activity Tolerance: Patient tolerated treatment well  Patient left: in bed;with call bell/phone within reach Nurse Communication: Mobility status PT Visit Diagnosis: Unsteadiness on feet (R26.81);Muscle weakness (generalized) (M62.81);Difficulty in walking, not elsewhere classified (R26.2)    Time: 3664-4034 PT Time Calculation (min) (ACUTE ONLY): 27 min   Charges:                 Ricke Kimoto, SPT 04/03/23, 1:27 PM

## 2023-04-04 ENCOUNTER — Inpatient Hospital Stay: Payer: PPO

## 2023-04-04 DIAGNOSIS — I5033 Acute on chronic diastolic (congestive) heart failure: Secondary | ICD-10-CM | POA: Diagnosis not present

## 2023-04-04 LAB — ECHOCARDIOGRAM COMPLETE
AR max vel: 1.74 cm2
AV Area VTI: 1.75 cm2
AV Area mean vel: 1.67 cm2
AV Mean grad: 8 mmHg
AV Peak grad: 14.9 mmHg
Ao pk vel: 1.93 m/s
Area-P 1/2: 4.68 cm2
Height: 60 in
MV VTI: 2.63 cm2
S' Lateral: 3.1 cm
Weight: 2694.9 [oz_av]

## 2023-04-04 LAB — URINALYSIS, COMPLETE (UACMP) WITH MICROSCOPIC
Bilirubin Urine: NEGATIVE
Glucose, UA: NEGATIVE mg/dL
Ketones, ur: NEGATIVE mg/dL
Nitrite: NEGATIVE
Protein, ur: NEGATIVE mg/dL
Specific Gravity, Urine: 1.006 (ref 1.005–1.030)
pH: 7 (ref 5.0–8.0)

## 2023-04-04 LAB — CBC WITH DIFFERENTIAL/PLATELET
Abs Immature Granulocytes: 0.02 K/uL (ref 0.00–0.07)
Basophils Absolute: 0 K/uL (ref 0.0–0.1)
Basophils Relative: 1 %
Eosinophils Absolute: 0.2 K/uL (ref 0.0–0.5)
Eosinophils Relative: 4 %
HCT: 26.2 % — ABNORMAL LOW (ref 36.0–46.0)
Hemoglobin: 8.1 g/dL — ABNORMAL LOW (ref 12.0–15.0)
Immature Granulocytes: 0 %
Lymphocytes Relative: 13 %
Lymphs Abs: 0.8 K/uL (ref 0.7–4.0)
MCH: 28.6 pg (ref 26.0–34.0)
MCHC: 30.9 g/dL (ref 30.0–36.0)
MCV: 92.6 fL (ref 80.0–100.0)
Monocytes Absolute: 0.8 K/uL (ref 0.1–1.0)
Monocytes Relative: 13 %
Neutro Abs: 4.4 K/uL (ref 1.7–7.7)
Neutrophils Relative %: 69 %
Platelets: 374 K/uL (ref 150–400)
RBC: 2.83 MIL/uL — ABNORMAL LOW (ref 3.87–5.11)
RDW: 16.5 % — ABNORMAL HIGH (ref 11.5–15.5)
WBC: 6.3 K/uL (ref 4.0–10.5)
nRBC: 0 % (ref 0.0–0.2)

## 2023-04-04 LAB — BASIC METABOLIC PANEL
Anion gap: 8 (ref 5–15)
BUN: 15 mg/dL (ref 8–23)
CO2: 27 mmol/L (ref 22–32)
Calcium: 8.6 mg/dL — ABNORMAL LOW (ref 8.9–10.3)
Chloride: 100 mmol/L (ref 98–111)
Creatinine, Ser: 0.83 mg/dL (ref 0.44–1.00)
GFR, Estimated: >60 mL/min (ref >60)
Glucose, Bld: 106 mg/dL — ABNORMAL HIGH (ref 70–99)
Potassium: 3.6 mmol/L (ref 3.5–5.1)
Sodium: 135 mmol/L (ref 135–145)

## 2023-04-04 LAB — MAGNESIUM: Magnesium: 2.5 mg/dL — ABNORMAL HIGH (ref 1.7–2.4)

## 2023-04-04 LAB — D-DIMER, QUANTITATIVE: D-Dimer, Quant: 4.74 ug/mL-FEU — ABNORMAL HIGH (ref 0.00–0.50)

## 2023-04-04 LAB — TSH: TSH: 3.106 u[IU]/mL (ref 0.350–4.500)

## 2023-04-04 MED ORDER — METOPROLOL TARTRATE 25 MG PO TABS
12.5000 mg | ORAL_TABLET | Freq: Two times a day (BID) | ORAL | Status: DC
  Administered 2023-04-04 – 2023-04-11 (×11): 12.5 mg via ORAL
  Filled 2023-04-04 (×12): qty 1

## 2023-04-04 MED ORDER — POTASSIUM CHLORIDE CRYS ER 20 MEQ PO TBCR
60.0000 meq | EXTENDED_RELEASE_TABLET | Freq: Once | ORAL | Status: AC
  Administered 2023-04-04: 60 meq via ORAL
  Filled 2023-04-04: qty 3

## 2023-04-04 MED ORDER — FUROSEMIDE 10 MG/ML IJ SOLN
60.0000 mg | Freq: Two times a day (BID) | INTRAMUSCULAR | Status: DC
  Administered 2023-04-04 – 2023-04-05 (×2): 60 mg via INTRAVENOUS
  Filled 2023-04-04 (×5): qty 6

## 2023-04-04 NOTE — Plan of Care (Signed)

## 2023-04-04 NOTE — Discharge Instructions (Addendum)

## 2023-04-04 NOTE — Progress Notes (Signed)
PROGRESS NOTE    Joan Ingram  ZOX:[id number] DOB: November 11, 1929 DOA: 04/02/2023 PCP: Enid Baas, MD    Brief Narrative:  89 y.o. female with medical history significant of paroxysmal A-fib on Xarelto, HFpEF, iron deficiency anemia, pulmonary hypertension, essential hypertension, osteoporosis, asthma, who presents to the ED due to leg swelling.   Joan Ingram, Joan has had gradually worsening lower extremity swelling that has made it difficult for her to ambulate.  Due to this, Joan has been walking very little.  Joan is uncertain if Joan has had dyspnea on exertion as Joan has not walked recently.  Joan denies any shortness of breath at rest, chest pain, palpitations.  Joan denies any recent illness, fever, chills, nausea, vomiting, diarrhea, abdominal pain, cough or rhinorrhea.  Joan has been taking her daily torsemide without improvement.   Assessment & Plan:   Principal Problem:   Acute on chronic heart failure with preserved ejection fraction (HFpEF) (HCC) Active Problems:   Moderate pulmonary hypertension (HCC)   Paroxysmal atrial fibrillation (HCC)   Benign essential hypertension   Anemia   Asthma, chronic  # Lower extremity edema Chronic problem, appears to be 2/2 venous stasis. Has slowly progressed. No pulm edema and TTE is relatively unremarkable, has some mild pulmonary hypertension, mild MR, normal EF, indeterminate diastolic function. chf may be playing something of a role. No significant kidney dysfunction or signs significant liver dysfunction. Uop 1.4 liters yesterday - will increase lasix to 60 IV bid, monitor uop and kidney function - f/u tsh, dimer - f/u urinalysis  # A-fib with rvr Asymptomatic today but hr in the 100s. Not rate controlled at home - will start low-dose metop 12.5 bid - continue xarelto  # HTN BP appropriate today. Controlled outpt with torsemide - continue lasix as above  # Asthma Quiescent - home  advair  # Iron deficiency anemia Recent hospitalization for subacute drop, declined endoluminal eval. Hgb 8s here stable from priors - monitor  # Debility PT advising snf - TOC consulted   DVT prophylaxis: Xarelto Code Status: DNR Family Communication: hcpoa Joan Ingram updated telephonically 7/17 Disposition Plan: Status is: Inpatient Remains inpatient appropriate because: ongoing w/u and iv diuresis   Level of care: Telemetry Medical  Consultants:  None  Procedures:  None  Antimicrobials: None    Subjective: Seen and examined.  Resting comfortably in bed.  No visible distress.     Objective: Vitals:   04/04/23 0417 04/04/23 0500 04/04/23 0817 04/04/23 1245  BP: 119/77  (!) 108/57 97/60  Pulse: 97  90 94  Resp: 18  20 19   Temp: 97.7 F (36.5 C)  97.7 F (36.5 C) 98.3 F (36.8 C)  TempSrc:      SpO2: 96%  97% 99%  Weight:  68.7 kg    Height:        Intake/Output Summary (Last 24 hours) at 04/04/2023 1407 Last data filed at 04/04/2023 1100 Gross per 24 hour  Intake 603 ml  Output 2500 ml  Net -1897 ml   Filed Weights   04/02/23 2139 04/04/23 0500  Weight: 76.4 kg 68.7 kg    Examination:  General exam: Appears calm and comfortable  Respiratory system: ctab, normal wob Cardiovascular system: S1-S2, irreg irreg, tachy, no murmurs, 3+ pitting edema BLE Gastrointestinal system: Soft, NT/ND Central nervous system: Alert and oriented. No focal neurological deficits. Extremities: Symmetric 5 x 5 power. Skin: No rashes, lesions or ulcers Psychiatry: Judgement and insight appear  normal. Mood & affect appropriate.     Data Reviewed: I have personally reviewed following labs and imaging studies  CBC: Recent Labs  Lab 04/02/23 1625 04/03/23 0602 04/04/23 0413  WBC 5.8 5.9 6.3  NEUTROABS 3.9 3.9 4.4  HGB 8.6* 8.0* 8.1*  HCT 27.3* 25.3* 26.2*  MCV 91.9 90.7 92.6  PLT 374 357 374   Basic Metabolic Panel: Recent Labs  Lab 04/02/23 1625  04/03/23 0602 04/04/23 0413  NA 133* 136 135  K 3.9 3.3* 3.6  CL 98 102 100  CO2 26 25 27   GLUCOSE 111* 93 106*  BUN 20 18 15   CREATININE 1.15* 1.01* 0.83  CALCIUM 8.7* 8.3* 8.6*  MG  --  1.9 2.5*   GFR: Estimated Creatinine Clearance: 39 mL/min (by C-G formula based on SCr of 0.83 mg/dL). Liver Function Tests: Recent Labs  Lab 04/02/23 1625  AST 25  ALT 12  ALKPHOS 55  BILITOT 0.6  PROT 6.7  ALBUMIN 2.7*   No results for input(s): "LIPASE", "AMYLASE" in the last 168 hours. No results for input(s): "AMMONIA" in the last 168 hours. Coagulation Profile: No results for input(s): "INR", "PROTIME" in the last 168 hours. Cardiac Enzymes: No results for input(s): "CKTOTAL", "CKMB", "CKMBINDEX", "TROPONINI" in the last 168 hours. BNP (last 3 results) No results for input(s): "PROBNP" in the last 8760 hours. HbA1C: No results for input(s): "HGBA1C" in the last 72 hours. CBG: No results for input(s): "GLUCAP" in the last 168 hours. Lipid Profile: No results for input(s): "CHOL", "HDL", "LDLCALC", "TRIG", "CHOLHDL", "LDLDIRECT" in the last 72 hours. Thyroid Function Tests: No results for input(s): "TSH", "T4TOTAL", "FREET4", "T3FREE", "THYROIDAB" in the last 72 hours. Anemia Panel: No results for input(s): "VITAMINB12", "FOLATE", "FERRITIN", "TIBC", "IRON", "RETICCTPCT" in the last 72 hours. Sepsis Labs: No results for input(s): "PROCALCITON", "LATICACIDVEN" in the last 168 hours.  No results found for this or any previous visit (from the past 240 hour(s)).       Radiology Studies: ECHOCARDIOGRAM COMPLETE  Result Date: 04/04/2023    ECHOCARDIOGRAM REPORT   Patient Name:   Joan Ingram Date of Exam: 04/03/2023 Medical Rec #:  [id number]         Height:       60.0 in Accession #:    [id number]        Weight:       168.4 lb Date of Birth:  1929-10-26         BSA:          1.735 m Patient Age:    89 years          BP:           109/71 mmHg Patient Gender: F                  HR:           98 bpm. Exam Location:  [facility] Procedure: 2D Echo, Cardiac Doppler and Color Doppler Indications:     CHF  History:         Patient has no prior history of Echocardiogram examinations.                  CHF, Pulmonary HTN, Arrythmias:Atrial Fibrillation; Risk                  Factors:Hypertension.  Sonographer:     Mikki Harbor Referring Phys:  [id number] Verdene Lennert Diagnosing Phys: Rozell Searing Custovic IMPRESSIONS  1. Left ventricular ejection fraction,  by estimation, is 55 to 60%. Left ventricular ejection fraction by PLAX is 57 %. The left ventricle has normal function. The left ventricle has no regional wall motion abnormalities. Indeterminate diastolic filling  due to E-A fusion.  2. Right ventricular systolic function is normal. The right ventricular size is normal. There is mildly elevated pulmonary artery systolic pressure.  3. Left atrial size was mildly dilated.  4. Right atrial size was mildly dilated.  5. The mitral valve is grossly normal. Mild mitral valve regurgitation.  6. The aortic valve is grossly normal. Aortic valve regurgitation is trivial. Aortic valve sclerosis/calcification is present, without any evidence of aortic stenosis. FINDINGS  Left Ventricle: Left ventricular ejection fraction, by estimation, is 55 to 60%. Left ventricular ejection fraction by PLAX is 57 %. The left ventricle has normal function. The left ventricle has no regional wall motion abnormalities. The left ventricular internal cavity size was normal in size. There is no left ventricular hypertrophy. Indeterminate diastolic filling due to E-A fusion. Right Ventricle: The right ventricular size is normal. No increase in right ventricular wall thickness. Right ventricular systolic function is normal. There is mildly elevated pulmonary artery systolic pressure. The tricuspid regurgitant velocity is 2.78  m/s, and with an assumed right atrial pressure of 8 mmHg, the estimated right ventricular systolic pressure is  38.9 mmHg. Left Atrium: Left atrial size was mildly dilated. Right Atrium: Right atrial size was mildly dilated. Pericardium: There is no evidence of pericardial effusion. Mitral Valve: The mitral valve is grossly normal. Mild mitral valve regurgitation. MV peak gradient, 4.3 mmHg. The mean mitral valve gradient is 1.0 mmHg. Tricuspid Valve: The tricuspid valve is grossly normal. Tricuspid valve regurgitation is mild. Aortic Valve: The aortic valve is grossly normal. Aortic valve regurgitation is trivial. Aortic valve sclerosis/calcification is present, without any evidence of aortic stenosis. Aortic valve mean gradient measures 8.0 mmHg. Aortic valve peak gradient measures 14.9 mmHg. Aortic valve area, by VTI measures 1.75 cm. Pulmonic Valve: The pulmonic valve was grossly normal. Pulmonic valve regurgitation is mild. Aorta: The aortic root and ascending aorta are structurally normal, with no evidence of dilitation. IAS/Shunts: No atrial level shunt detected by color flow Doppler.  LEFT VENTRICLE PLAX 2D LV EF:         Left ventricular ejection fraction by PLAX is 57 %. LVIDd:         4.40 cm LVIDs:         3.10 cm LV PW:         1.20 cm LV IVS:        1.10 cm LVOT diam:     2.00 cm LV SV:         63 LV SV Index:   36 LVOT Area:     3.14 cm  RIGHT VENTRICLE RV Basal diam:  3.85 cm RV Mid diam:    3.20 cm RV S prime:     11.10 cm/s LEFT ATRIUM             Index        RIGHT ATRIUM           Index LA diam:        4.60 cm 2.65 cm/m   RA Area:     20.70 cm LA Vol (A2C):   72.0 ml 41.49 ml/m  RA Volume:   59.80 ml  34.46 ml/m LA Vol (A4C):   62.7 ml 36.13 ml/m LA Biplane Vol: 69.2 ml 39.88 ml/m  AORTIC VALVE  PULMONIC VALVE AV Area (Vmax):    1.74 cm      PV Vmax:       1.24 m/s AV Area (Vmean):   1.67 cm      PV Peak grad:  6.2 mmHg AV Area (VTI):     1.75 cm AV Vmax:           193.00 cm/s AV Vmean:          126.000 cm/s AV VTI:            0.360 m AV Peak Grad:      14.9 mmHg AV Mean Grad:       8.0 mmHg LVOT Vmax:         107.00 cm/s LVOT Vmean:        67.000 cm/s LVOT VTI:          0.200 m LVOT/AV VTI ratio: 0.56  AORTA Ao Root diam: 3.40 cm Ao Asc diam:  3.20 cm MITRAL VALVE               TRICUSPID VALVE MV Area (PHT): 4.68 cm    TR Peak grad:   30.9 mmHg MV Area VTI:   2.63 cm    TR Vmax:        278.00 cm/s MV Peak grad:  4.3 mmHg MV Mean grad:  1.0 mmHg    SHUNTS MV Vmax:       1.04 m/s    Systemic VTI:  0.20 m MV Vmean:      47.3 cm/s   Systemic Diam: 2.00 cm MV Decel Time: 162 msec MV E velocity: 92.20 cm/s Rozell Searing Custovic Electronically signed by Clotilde Dieter Signature Date/Time: 04/04/2023/1:55:34 PM    Final    CT Chest W Contrast  Result Date: 04/02/2023 CLINICAL DATA:  Shortness of breath, edema in lower extremities EXAM: CT CHEST WITH CONTRAST TECHNIQUE: Multidetector CT imaging of the chest was performed during intravenous contrast administration. RADIATION DOSE REDUCTION: This exam was performed according to the departmental dose-optimization program which includes automated exposure control, adjustment of the mA and/or kV according to patient size and/or use of iterative reconstruction technique. CONTRAST:  60mL OMNIPAQUE IOHEXOL 300 MG/ML  SOLN COMPARISON:  Chest radiograph done today FINDINGS: Cardiovascular: There are scattered calcifications in thoracic aorta. Coronary artery calcifications are seen. Mediastinum/Nodes: No significant lymphadenopathy is seen. Large fixed hiatal hernia is seen. Lungs/Pleura: Increase in the AP 6 diameter of chest suggests COPD. Small linear patchy infiltrate is seen in medial left lower lung field. Small linear densities are seen in medial lower lung fields. There is no focal pulmonary consolidation. Possible nodular density described in the lower lung field in the chest radiographs could not be identified in the lung fields in the CT. There is no pleural effusion or pneumothorax. There is mild ectasia of bronchi in right middle lobe. Upper  Abdomen: There are few small myelinated low-density lesions in liver largest measuring 3.9 cm in size suggesting multiple hepatic cysts. Musculoskeletal: No acute findings are seen. There are numerous small calcifications in both breasts. IMPRESSION: COPD. Small linear patchy densities are seen in both lower lung fields suggesting subsegmental atelectasis or scarring or pneumonia. There is no discrete focal lung nodule in left lower lung field. There is no focal pulmonary consolidation. There is no pleural effusion or pneumothorax. Coronary artery calcifications are seen.  Aortic arteriosclerosis. Large fixed hiatal hernia.  Multiple hepatic cysts. Electronically Signed   By: Ernie Avena M.D.   On: 04/02/2023  18:32   DG Chest 2 View  Result Date: 04/02/2023 CLINICAL DATA:  Shortness of breath EXAM: CHEST - 2 VIEW COMPARISON:  Chest x-ray dated February 28, 2019 FINDINGS: Cardiac and mediastinal contours are unchanged. Large hiatal hernia. Eventration of the right hemidiaphragm. Indeterminate infrahilar nodular opacity seen on lateral view. No pleural effusion. IMPRESSION: 1. Indeterminate infrahilar nodular opacity seen on lateral view, could be due to redundant mucosa related to large hiatal hernia, although pulmonary nodule can not be excluded. Recommend further evaluation with contrast-enhanced chest CT. 2. Large hiatal hernia. Electronically Signed   By: Allegra Lai M.D.   On: 04/02/2023 17:13        Scheduled Meds:  furosemide  40 mg Intravenous Q12H   mometasone-formoterol  2 puff Inhalation BID   pantoprazole  40 mg Oral BID AC   Rivaroxaban  15 mg Oral Q supper   sodium chloride flush  3 mL Intravenous Q12H   Continuous Infusions:  sodium chloride       LOS: 2 days   Silvano Bilis, MD [facility]   If 7PM-7AM, please contact night-coverage  04/04/2023, 2:07 PM

## 2023-04-04 NOTE — Progress Notes (Signed)
Physical Therapy Treatment Patient Details Name: Joan Ingram MRN: [id number] DOB: 1930-07-12 Today's Date: 04/04/2023   History of Present Illness Pt is a 89 y.o. female with PMH consisting of Paroxysmal A-fib on Xarelto, iron deficiency anemia, pulmonary hypertension, essential hypertension, osteoporosis, asthma. Pt admitted to ED 7/15 with reports of gradually worsening LE swelling. MD assessment includes: acute on chronic HFpEF.    PT Comments  Pt pleasant and motivated for therapy. Vitals in supine at start of session as follows: O2 95% on room air, HR highly variable (mid 70's to mid 90's). Pt was mod I for sup-sit with increased time/effort to come to sitting EOB. Pt able to perform multiple STS throughout session with min A for elevation. Her HR ranged from low 130's to low 150's with all STS attempts; still highly variable. HR remained at that range while walking; she ambulated ~10 feet in the room with min guard and RW. She had no instances of LOB and was able to take slightly longer steps today as compared to yesterday, however ambulation still continues to be limited secondary to elevated HR. Extra time for today's session to help with patient/room hygiene. Pt left in recliner with all needs in reach; HR at rest at end was varying between 105 to mid 110's. Pt will benefit from continued PT services upon discharge to safely address deficits listed in patient problem list for decreased caregiver assistance and eventual return to PLOF.     Assistance Recommended at Discharge Intermittent Supervision/Assistance  If plan is discharge home, recommend the following:  Can travel by private vehicle    A little help with walking and/or transfers;A little help with bathing/dressing/bathroom;Assistance with cooking/housework;Assist for transportation;Help with stairs or ramp for entrance      Equipment Recommendations  Other (comment) (TBD)    Recommendations for Other Services        Precautions / Restrictions Precautions Precautions: Fall Restrictions Weight Bearing Restrictions: No     Mobility  Bed Mobility Overal bed mobility: Needs Assistance Bed Mobility: Supine to Sit     Supine to sit: Modified independent (Device/Increase time)     General bed mobility comments: Mod I for sup-sit, increased time and effort.    Transfers Overall transfer level: Needs assistance Equipment used: Rolling walker (2 wheels) Transfers: Sit to/from Stand Sit to Stand: Min assist           General transfer comment: Min A for elevation from bed and recliner. Increased time/effort to come to standing. Pt had more success pushing from bed/recliner first before reaching for RW.    Ambulation/Gait Ambulation/Gait assistance: Min guard Gait Distance (Feet): 10 Feet Assistive device: Rolling walker (2 wheels) Gait Pattern/deviations: Decreased step length - right, Decreased step length - left, Shuffle Gait velocity: decreased     General Gait Details: Pt able to ambulate ~10 feet in room with min guard and RW for safety. No LOB or legs buckling noted. Pt able to take slightly larger steps compared to yesterday.   Stairs             Wheelchair Mobility     Tilt Bed    Modified Rankin (Stroke Patients Only)       Balance Overall balance assessment: Needs assistance Sitting-balance support: Feet supported, No upper extremity supported Sitting balance-Leahy Scale: Good     Standing balance support: Reliant on assistive device for balance, Bilateral upper extremity supported, During functional activity Standing balance-Leahy Scale: Fair  Cognition Arousal/Alertness: Awake/alert Behavior During Therapy: WFL for tasks assessed/performed Overall Cognitive Status: Within Functional Limits for tasks assessed                                 General Comments: Pt asked frequently if it was common for  her to have to urinate so much throughout the session        Exercises Total Joint Exercises Long Arc Quad: AROM, Both, 10 reps, Seated General Exercises - Lower Extremity Hip Flexion/Marching: AROM, Both, 10 reps, Seated Other Exercises Other Exercises: x3 STS from varying surfaces    General Comments General comments (skin integrity, edema, etc.): LE edema present bilaterally.      Pertinent Vitals/Pain Pain Assessment Pain Assessment: No/denies pain    Home Living                          Prior Function            PT Goals (current goals can now be found in the care plan section) Progress towards PT goals: Progressing toward goals    Frequency    Min 1X/week      PT Plan Current plan remains appropriate    Co-evaluation              AM-PAC PT "6 Clicks" Mobility   Outcome Measure  Help needed turning from your back to your side while in a flat bed without using bedrails?: A Little Help needed moving from lying on your back to sitting on the side of a flat bed without using bedrails?: A Little Help needed moving to and from a bed to a chair (including a wheelchair)?: A Little Help needed standing up from a chair using your arms (e.g., wheelchair or bedside chair)?: A Little Help needed to walk in hospital room?: A Little Help needed climbing 3-5 steps with a railing? : Total 6 Click Score: 16    End of Session Equipment Utilized During Treatment: Gait belt Activity Tolerance: Patient tolerated treatment well Patient left: in chair;with chair alarm set;with call bell/phone within reach Nurse Communication: Mobility status, MD notified of HR with activity per above PT Visit Diagnosis: Unsteadiness on feet (R26.81);Muscle weakness (generalized) (M62.81);Difficulty in walking, not elsewhere classified (R26.2)     Time: 1007-1050 PT Time Calculation (min) (ACUTE ONLY): 43 min  Charges:                            Abbigaile Rockman,  SPT 04/04/23, 1:30 PM

## 2023-04-04 NOTE — NC FL2 (Signed)
[place] MEDICAID FL2 LEVEL OF CARE FORM     IDENTIFICATION  Patient Name: CARLEE VONDERHAAR Birthdate: 11/30/1929 Sex: female Admission Date (Current Location): 04/02/2023  [facility] and [place] Number:  Chiropodist and Address:  [facility], [address], [place], [place] [address]      Provider Number: [id number]  Attending Physician Name and Address:  Kathrynn Running, MD  Relative Name and Phone Number:  [facility] (Relative)  [phone] (Mobile)    Current Level of Care: Hospital Recommended Level of Care: Skilled Nursing Facility Prior Approval Number:    Date Approved/Denied:   PASRR Number: [id number]  Discharge Plan: Home    Current Diagnoses: Patient Active Problem List   Diagnosis Date Noted   Senile osteoporosis 04/02/2023   Acute on chronic heart failure with preserved ejection fraction (HFpEF) (HCC) 04/02/2023   Asthma, chronic 04/02/2023   Anemia 01/04/2023   Urinary tract infection without hematuria 01/04/2023   Symptomatic anemia 01/03/2023   Hyponatremia 01/03/2023   Hypokalemia 01/03/2023   Benign essential hypertension 03/16/2022   Esophageal reflux 03/16/2022   Moderate pulmonary hypertension (HCC) 01/12/2022   Iron deficiency anemia 09/15/2021   Chronic heart failure with preserved ejection fraction (HCC) 08/04/2021   Paroxysmal atrial fibrillation (HCC) 02/26/2020   Plantar fascial fibromatosis 02/26/2020   Closed bimalleolar fracture 02/26/2020    Orientation RESPIRATION BLADDER Height & Weight     Self, Time, Situation, Place  Normal Incontinent Weight: 68.7 kg Height:  5' (152.4 cm)  BEHAVIORAL SYMPTOMS/MOOD NEUROLOGICAL BOWEL NUTRITION STATUS  Other (Comment) (n/a)  (n/a) Continent Diet (2 gram)  AMBULATORY STATUS COMMUNICATION OF NEEDS Skin   Limited Assist Verbally Bruising (bilateral lower extremity)                       Personal Care Assistance Level of Assistance   Bathing, Dressing Bathing Assistance: Limited assistance   Dressing Assistance: Limited assistance     Functional Limitations Info             SPECIAL CARE FACTORS FREQUENCY  PT (By licensed PT), OT (By licensed OT)     PT Frequency: Min 2x weekly OT Frequency: Min 2x weekly            Contractures Contractures Info: Not present    Additional Factors Info  Code Status, Allergies Code Status Info: DNR Allergies Info: Other, Aspirin, Astemizole, Benadryl (Diphenhydramine Hcl), Ciprofloxacin, Codeine, Doxycycline, Fexofenadine, Lansoprazole, Levofloxacin, Loratadine, Macrobid (Nitrofurantoin Macrocrystal), Morphine And Codeine, Omeprazole, Oxycodone, Pantoprazole, Penicillins, Rabeprazole, Terfenadine           Current Medications (04/04/2023):  This is the current hospital active medication list Current Facility-Administered Medications  Medication Dose Route Frequency Provider Last Rate Last Admin   0.9 %  sodium chloride infusion  250 mL Intravenous PRN Verdene Lennert, MD       acetaminophen (TYLENOL) tablet 650 mg  650 mg Oral Q4H PRN Verdene Lennert, MD       furosemide (LASIX) injection 60 mg  60 mg Intravenous Q12H Wouk, Wilfred Curtis, MD       metoprolol tartrate (LOPRESSOR) tablet 12.5 mg  12.5 mg Oral BID Wouk, Wilfred Curtis, MD       mometasone-formoterol [facility]) 200-5 MCG/ACT inhaler 2 puff  2 puff Inhalation BID Verdene Lennert, MD   2 puff at 04/04/23 1144   ondansetron (ZOFRAN) injection 4 mg  4 mg Intravenous Q6H PRN Verdene Lennert, MD  pantoprazole (PROTONIX) EC tablet 40 mg  40 mg Oral BID AC Verdene Lennert, MD   40 mg at 04/04/23 0850   potassium chloride SA (KLOR-CON M) CR tablet 60 mEq  60 mEq Oral Once Wouk, Wilfred Curtis, MD       Rivaroxaban Carlena Hurl) tablet 15 mg  15 mg Oral Q supper Verdene Lennert, MD   15 mg at 04/03/23 1647   sodium chloride flush (NS) 0.9 % injection 3 mL  3 mL Intravenous Q12H Verdene Lennert, MD   3 mL at 04/04/23 0851    sodium chloride flush (NS) 0.9 % injection 3 mL  3 mL Intravenous PRN Verdene Lennert, MD         Discharge Medications: Please see discharge summary for a list of discharge medications.  Relevant Imaging Results:  Relevant Lab Results:   Additional Information SS# [id number]  Truddie Hidden, RN

## 2023-04-05 DIAGNOSIS — I5033 Acute on chronic diastolic (congestive) heart failure: Secondary | ICD-10-CM | POA: Diagnosis not present

## 2023-04-05 LAB — CBC
HCT: 26.3 % — ABNORMAL LOW (ref 36.0–46.0)
Hemoglobin: 8.5 g/dL — ABNORMAL LOW (ref 12.0–15.0)
MCH: 28.9 pg (ref 26.0–34.0)
MCHC: 32.3 g/dL (ref 30.0–36.0)
MCV: 89.5 fL (ref 80.0–100.0)
Platelets: 374 10*3/uL (ref 150–400)
RBC: 2.94 MIL/uL — ABNORMAL LOW (ref 3.87–5.11)
RDW: 16.4 % — ABNORMAL HIGH (ref 11.5–15.5)
WBC: 6.8 10*3/uL (ref 4.0–10.5)
nRBC: 0 % (ref 0.0–0.2)

## 2023-04-05 LAB — BASIC METABOLIC PANEL
Anion gap: 7 (ref 5–15)
BUN: 18 mg/dL (ref 8–23)
CO2: 29 mmol/L (ref 22–32)
Calcium: 8.8 mg/dL — ABNORMAL LOW (ref 8.9–10.3)
Chloride: 99 mmol/L (ref 98–111)
Creatinine, Ser: 0.94 mg/dL (ref 0.44–1.00)
GFR, Estimated: 58 mL/min — ABNORMAL LOW (ref >60)
Glucose, Bld: 109 mg/dL — ABNORMAL HIGH (ref 70–99)
Potassium: 4.2 mmol/L (ref 3.5–5.1)
Sodium: 135 mmol/L (ref 135–145)

## 2023-04-05 NOTE — Progress Notes (Signed)
PROGRESS NOTE    Joan Ingram  KVQ:[id number] DOB: 1929-12-28 DOA: 04/02/2023 PCP: Enid Baas, MD    Brief Narrative:  89 y.o. female with medical history significant of paroxysmal A-fib on Xarelto, HFpEF, iron deficiency anemia, pulmonary hypertension, essential hypertension, osteoporosis, asthma, who presents to the ED due to leg swelling.   Joan Ingram dates that over the last couple weeks, she has had gradually worsening lower extremity swelling that has made it difficult for her to ambulate.  Due to this, she has been walking very little.  She is uncertain if she has had dyspnea on exertion as she has not walked recently.  She denies any shortness of breath at rest, chest pain, palpitations.  She denies any recent illness, fever, chills, nausea, vomiting, diarrhea, abdominal pain, cough or rhinorrhea.  She has been taking her daily torsemide without improvement.   Assessment & Plan:   Principal Problem:   Acute on chronic heart failure with preserved ejection fraction (HFpEF) (HCC) Active Problems:   Moderate pulmonary hypertension (HCC)   Paroxysmal atrial fibrillation (HCC)   Benign essential hypertension   Anemia   Asthma, chronic  # Lower extremity edema Chronic problem, appears to be 2/2 venous stasis. Has slowly progressed. No pulm edema and TTE is relatively unremarkable, has some mild pulmonary hypertension, mild MR, normal EF, indeterminate diastolic function. chf may be playing something of a role. No significant kidney dysfunction or signs significant liver dysfunction. Uop 2.8 last 24 hours. PVL neg for DVT. TSH wnl. No proteinuria on urinalysis. - cont lasix 60 IV bid, monitor uop and kidney function  # A-fib with rvr Asymptomatic, hr improved to around 100 at rest. Bp borderline but seems to be tolerating metop - cont low-dose metop 12.5 bid - continue xarelto  # HTN BP low normal, on torsemide at home - continue lasix as above  #  Asthma Quiescent - home advair  # Iron deficiency anemia Recent hospitalization for subacute drop, declined endoluminal eval. Hgb 8s here stable from priors - monitor on xarelto  # Debility PT advising snf - TOC consulted   DVT prophylaxis: Xarelto Code Status: DNR Family Communication: hcpoa gaylene updated telephonically 7/18 Disposition Plan: Status is: Inpatient Remains inpatient appropriate because: ongoing w/u and iv diuresis   Level of care: Telemetry Medical  Consultants:  None  Procedures:  None  Antimicrobials: None    Subjective: Seen and examined.  Resting comfortably in bed.  No visible distress.     Objective: Vitals:   04/05/23 0021 04/05/23 0549 04/05/23 0916 04/05/23 1240  BP: (!) 99/58 98/74 99/64  (!) 96/58  Pulse: 90 80 97 (!) 108  Resp: 18 18 16 16   Temp: 97.8 F (36.6 C) 97.7 F (36.5 C) 98.3 F (36.8 C) 98 F (36.7 C)  TempSrc:   Oral Oral  SpO2: 99% 93% 96% 92%  Weight:      Height:        Intake/Output Summary (Last 24 hours) at 04/05/2023 1329 Last data filed at 04/05/2023 1300 Gross per 24 hour  Intake --  Output 3300 ml  Net -3300 ml   Filed Weights   04/02/23 2139 04/04/23 0500  Weight: 76.4 kg 68.7 kg    Examination:  General exam: Appears calm and comfortable  Respiratory system: ctab, normal wob Cardiovascular system: S1-S2, irreg irreg, tachy, no murmurs, 2+ pitting edema BLE Gastrointestinal system: Soft, NT/ND Central nervous system: Alert and oriented. No focal neurological deficits. Extremities: Symmetric 5 x 5 power. Skin:  No rashes, lesions or ulcers Psychiatry: Judgement and insight appear normal. Mood & affect appropriate.     Data Reviewed: I have personally reviewed following labs and imaging studies  CBC: Recent Labs  Lab 04/02/23 1625 04/03/23 0602 04/04/23 0413 04/05/23 0437  WBC 5.8 5.9 6.3 6.8  NEUTROABS 3.9 3.9 4.4  --   HGB 8.6* 8.0* 8.1* 8.5*  HCT 27.3* 25.3* 26.2* 26.3*  MCV 91.9  90.7 92.6 89.5  PLT 374 357 374 374   Basic Metabolic Panel: Recent Labs  Lab 04/02/23 1625 04/03/23 0602 04/04/23 0413 04/05/23 0437  NA 133* 136 135 135  K 3.9 3.3* 3.6 4.2  CL 98 102 100 99  CO2 26 25 27 29   GLUCOSE 111* 93 106* 109*  BUN 20 18 15 18   CREATININE 1.15* 1.01* 0.83 0.94  CALCIUM 8.7* 8.3* 8.6* 8.8*  MG  --  1.9 2.5*  --    GFR: Estimated Creatinine Clearance: 34.4 mL/min (by C-G formula based on SCr of 0.94 mg/dL). Liver Function Tests: Recent Labs  Lab 04/02/23 1625  AST 25  ALT 12  ALKPHOS 55  BILITOT 0.6  PROT 6.7  ALBUMIN 2.7*   No results for input(s): "LIPASE", "AMYLASE" in the last 168 hours. No results for input(s): "AMMONIA" in the last 168 hours. Coagulation Profile: No results for input(s): "INR", "PROTIME" in the last 168 hours. Cardiac Enzymes: No results for input(s): "CKTOTAL", "CKMB", "CKMBINDEX", "TROPONINI" in the last 168 hours. BNP (last 3 results) No results for input(s): "PROBNP" in the last 8760 hours. HbA1C: No results for input(s): "HGBA1C" in the last 72 hours. CBG: No results for input(s): "GLUCAP" in the last 168 hours. Lipid Profile: No results for input(s): "CHOL", "HDL", "LDLCALC", "TRIG", "CHOLHDL", "LDLDIRECT" in the last 72 hours. Thyroid Function Tests: Recent Labs    04/04/23 0413  TSH 3.106   Anemia Panel: No results for input(s): "VITAMINB12", "FOLATE", "FERRITIN", "TIBC", "IRON", "RETICCTPCT" in the last 72 hours. Sepsis Labs: No results for input(s): "PROCALCITON", "LATICACIDVEN" in the last 168 hours.  No results found for this or any previous visit (from the past 240 hour(s)).       Radiology Studies: US Venous Img Lower Bilateral (DVT)  Result Date: 04/04/2023 CLINICAL DATA:  Positive D-dimer EXAM: BILATERAL LOWER EXTREMITY VENOUS DOPPLER ULTRASOUND TECHNIQUE: Gray-scale sonography with graded compression, as well as color Doppler and duplex ultrasound were performed to evaluate the lower  extremity deep venous systems from the level of the common femoral vein and including the common femoral, femoral, profunda femoral, popliteal and calf veins including the posterior tibial, peroneal and gastrocnemius veins when visible. The superficial great saphenous vein was also interrogated. Spectral Doppler was utilized to evaluate flow at rest and with distal augmentation maneuvers in the common femoral, femoral and popliteal veins. COMPARISON:  None Available. FINDINGS: RIGHT LOWER EXTREMITY Common Femoral Vein: No evidence of thrombus. Normal compressibility, respiratory phasicity and response to augmentation. Saphenofemoral Junction: No evidence of thrombus. Normal compressibility and flow on color Doppler imaging. Profunda Femoral Vein: No evidence of thrombus. Normal compressibility and flow on color Doppler imaging. Femoral Vein: No evidence of thrombus. Normal compressibility, respiratory phasicity and response to augmentation. Popliteal Vein: No evidence of thrombus. Normal compressibility, respiratory phasicity and response to augmentation. Calf Veins: No evidence of thrombus. Normal compressibility and flow on color Doppler imaging. Superficial Great Saphenous Vein: No evidence of thrombus. Normal compressibility. Venous Reflux:  None. Other Findings:  There is subcutaneous edema. LEFT LOWER EXTREMITY Common  Femoral Vein: No evidence of thrombus. Normal compressibility, respiratory phasicity and response to augmentation. Saphenofemoral Junction: No evidence of thrombus. Normal compressibility and flow on color Doppler imaging. Profunda Femoral Vein: No evidence of thrombus. Normal compressibility and flow on color Doppler imaging. Femoral Vein: No evidence of thrombus. Normal compressibility, respiratory phasicity and response to augmentation. Popliteal Vein: No evidence of thrombus. Normal compressibility, respiratory phasicity and response to augmentation. Calf Veins: No evidence of thrombus.  Normal compressibility and flow on color Doppler imaging. Superficial Great Saphenous Vein: No evidence of thrombus. Normal compressibility. Venous Reflux:  None. Other Findings:  There is subcutaneous edema. IMPRESSION: No evidence of deep venous thrombosis in either lower extremity. Electronically Signed   By: Ernie Avena M.D.   On: 04/04/2023 19:15   ECHOCARDIOGRAM COMPLETE  Result Date: 04/04/2023    ECHOCARDIOGRAM REPORT   Patient Name:   SHACARRA CHOE Date of Exam: 04/03/2023 Medical Rec #:  [id number]         Height:       60.0 in Accession #:    [id number]        Weight:       168.4 lb Date of Birth:  1930-05-13         BSA:          1.735 m Patient Age:    89 years          BP:           109/71 mmHg Patient Gender: F                 HR:           98 bpm. Exam Location:  [facility] Procedure: 2D Echo, Cardiac Doppler and Color Doppler Indications:     CHF  History:         Patient has no prior history of Echocardiogram examinations.                  CHF, Pulmonary HTN, Arrythmias:Atrial Fibrillation; Risk                  Factors:Hypertension.  Sonographer:     Mikki Harbor Referring Phys:  [id number] Verdene Lennert Diagnosing Phys: Rozell Searing Custovic IMPRESSIONS  1. Left ventricular ejection fraction, by estimation, is 55 to 60%. Left ventricular ejection fraction by PLAX is 57 %. The left ventricle has normal function. The left ventricle has no regional wall motion abnormalities. Indeterminate diastolic filling  due to E-A fusion.  2. Right ventricular systolic function is normal. The right ventricular size is normal. There is mildly elevated pulmonary artery systolic pressure.  3. Left atrial size was mildly dilated.  4. Right atrial size was mildly dilated.  5. The mitral valve is grossly normal. Mild mitral valve regurgitation.  6. The aortic valve is grossly normal. Aortic valve regurgitation is trivial. Aortic valve sclerosis/calcification is present, without any evidence of aortic stenosis.  FINDINGS  Left Ventricle: Left ventricular ejection fraction, by estimation, is 55 to 60%. Left ventricular ejection fraction by PLAX is 57 %. The left ventricle has normal function. The left ventricle has no regional wall motion abnormalities. The left ventricular internal cavity size was normal in size. There is no left ventricular hypertrophy. Indeterminate diastolic filling due to E-A fusion. Right Ventricle: The right ventricular size is normal. No increase in right ventricular wall thickness. Right ventricular systolic function is normal. There is mildly elevated pulmonary artery systolic pressure. The tricuspid regurgitant velocity is 2.78  m/s, and with an assumed right atrial pressure of 8 mmHg, the estimated right ventricular systolic pressure is 38.9 mmHg. Left Atrium: Left atrial size was mildly dilated. Right Atrium: Right atrial size was mildly dilated. Pericardium: There is no evidence of pericardial effusion. Mitral Valve: The mitral valve is grossly normal. Mild mitral valve regurgitation. MV peak gradient, 4.3 mmHg. The mean mitral valve gradient is 1.0 mmHg. Tricuspid Valve: The tricuspid valve is grossly normal. Tricuspid valve regurgitation is mild. Aortic Valve: The aortic valve is grossly normal. Aortic valve regurgitation is trivial. Aortic valve sclerosis/calcification is present, without any evidence of aortic stenosis. Aortic valve mean gradient measures 8.0 mmHg. Aortic valve peak gradient measures 14.9 mmHg. Aortic valve area, by VTI measures 1.75 cm. Pulmonic Valve: The pulmonic valve was grossly normal. Pulmonic valve regurgitation is mild. Aorta: The aortic root and ascending aorta are structurally normal, with no evidence of dilitation. IAS/Shunts: No atrial level shunt detected by color flow Doppler.  LEFT VENTRICLE PLAX 2D LV EF:         Left ventricular ejection fraction by PLAX is 57 %. LVIDd:         4.40 cm LVIDs:         3.10 cm LV PW:         1.20 cm LV IVS:        1.10 cm  LVOT diam:     2.00 cm LV SV:         63 LV SV Index:   36 LVOT Area:     3.14 cm  RIGHT VENTRICLE RV Basal diam:  3.85 cm RV Mid diam:    3.20 cm RV S prime:     11.10 cm/s LEFT ATRIUM             Index        RIGHT ATRIUM           Index LA diam:        4.60 cm 2.65 cm/m   RA Area:     20.70 cm LA Vol (A2C):   72.0 ml 41.49 ml/m  RA Volume:   59.80 ml  34.46 ml/m LA Vol (A4C):   62.7 ml 36.13 ml/m LA Biplane Vol: 69.2 ml 39.88 ml/m  AORTIC VALVE                     PULMONIC VALVE AV Area (Vmax):    1.74 cm      PV Vmax:       1.24 m/s AV Area (Vmean):   1.67 cm      PV Peak grad:  6.2 mmHg AV Area (VTI):     1.75 cm AV Vmax:           193.00 cm/s AV Vmean:          126.000 cm/s AV VTI:            0.360 m AV Peak Grad:      14.9 mmHg AV Mean Grad:      8.0 mmHg LVOT Vmax:         107.00 cm/s LVOT Vmean:        67.000 cm/s LVOT VTI:          0.200 m LVOT/AV VTI ratio: 0.56  AORTA Ao Root diam: 3.40 cm Ao Asc diam:  3.20 cm MITRAL VALVE               TRICUSPID VALVE MV Area (PHT): 4.68  cm    TR Peak grad:   30.9 mmHg MV Area VTI:   2.63 cm    TR Vmax:        278.00 cm/s MV Peak grad:  4.3 mmHg MV Mean grad:  1.0 mmHg    SHUNTS MV Vmax:       1.04 m/s    Systemic VTI:  0.20 m MV Vmean:      47.3 cm/s   Systemic Diam: 2.00 cm MV Decel Time: 162 msec MV E velocity: 92.20 cm/s Designer, multimedia signed by Clotilde Dieter Signature Date/Time: 04/04/2023/1:55:34 PM    Final         Scheduled Meds:  furosemide  60 mg Intravenous Q12H   metoprolol tartrate  12.5 mg Oral BID   mometasone-formoterol  2 puff Inhalation BID   pantoprazole  40 mg Oral BID AC   Rivaroxaban  15 mg Oral Q supper   sodium chloride flush  3 mL Intravenous Q12H   Continuous Infusions:  sodium chloride       LOS: 3 days   Silvano Bilis, MD [facility]   If 7PM-7AM, please contact night-coverage  04/05/2023, 1:29 PM

## 2023-04-05 NOTE — TOC Progression Note (Signed)
Transition of Care [facility]) - Progression Note    Patient Details  Name: Joan Ingram MRN: [id number] Date of Birth: 1929-11-16  [facility] of [facility]) CM/SW Contact  Truddie Hidden, RN Phone Number: 04/05/2023, 3:56 PM  Clinical Narrative:    Sherron Monday with [facility], patient's POA to give bed offers. Offers for [facility] and [facility] were presented. Family chose [facility]. She has been advised patient's admission to SNF will require a authorization.         Expected Discharge Plan and Services                                               Social Determinants of Health (SDOH) Interventions SDOH Screenings   Food Insecurity: No Food Insecurity (01/04/2023)  Housing: Low Risk  (01/04/2023)  Transportation Needs: No Transportation Needs (01/04/2023)  Utilities: Not At Risk (01/04/2023)  Tobacco Use: Low Risk  (04/02/2023)    Readmission Risk Interventions     No data to display

## 2023-04-05 NOTE — Plan of Care (Signed)

## 2023-04-05 NOTE — Progress Notes (Signed)
       CROSS COVER NOTE  NAME: Joan Ingram MRN: [id number] DOB : 1929/09/26    Concern as stated by nurse / staff   BP is 88/57 and 60mg  of lasix is due  37 mins AD she came in for CHF exacerbation and BLE swelling, her feet/legs are about +4. She is asymptomatic as far as her BP      Pertinent findings on chart review: Today's progress note reviewed.  Noted that patient takes Lasix for chronic venous stasis as follows: # Lower extremity edema Chronic problem, appears to be 2/2 venous stasis. Has slowly progressed. No pulm edema and TTE is relatively unremarkable, has some mild pulmonary hypertension, mild MR, normal EF, indeterminate diastolic function. chf may be playing something of a role. No significant kidney dysfunction or signs significant liver dysfunction. Uop 2.8 last 24 hours. PVL neg for DVT. TSH wnl. No proteinuria on urinalysis. - cont lasix 60 IV bid, monitor uop and kidney function    04/05/2023    8:00 PM 04/05/2023    4:55 PM 04/05/2023   12:40 PM  Vitals with BMI  Systolic 88 93 96  Diastolic 57 58 58  Pulse  88 108     Assessment and  Interventions   Assessment: Hypotension, likely related to IV Lasix for venous stasis  Plan: Hold IV Lasix tonight X X

## 2023-04-05 NOTE — Care Management Important Message (Signed)
Important Message  Patient Details  Name: MERYN SARRACINO MRN: [id number] Date of Birth: Mar 09, 1930   Medicare Important Message Given:  Yes     [name] 04/05/2023, 2:23 PM

## 2023-04-05 NOTE — Progress Notes (Signed)
Occupational Therapy Treatment Patient Details Name: Joan Ingram MRN: [id number] DOB: 1930/03/25 Today's Date: 04/05/2023   History of present illness Pt is a 89 y.o. female with PMH consisting of Paroxysmal A-fib on Xarelto, iron deficiency anemia, pulmonary hypertension, essential hypertension, osteoporosis, asthma. Pt admitted to ED 7/15 with reports of gradually worsening LE swelling. MD assessment includes: acute on chronic HFpEF.   OT comments  Pt seen for OT tx. Pt agreeable, denies pain or SOB. Pt completed sup>sit EOB with incr time/effort but no direct assist required. Pt required MAX A for donning grippy socks after attempting herself but limited by BLE edema and decreased flexibility. MIN A for STS with RW with VC for hand placement and anterior shift. HR up to 114 with mobility. Pt in recliner with all needs in reach at end of session. Continues to benefit from skilled OT services.    Recommendations for follow up therapy are one component of a multi-disciplinary discharge planning process, led by the attending physician.  Recommendations may be updated based on patient status, additional functional criteria and insurance authorization.    Assistance Recommended at Discharge Intermittent Supervision/Assistance  Patient can return home with the following  A little help with walking and/or transfers;A little help with bathing/dressing/bathroom;Direct supervision/assist for medications management;Direct supervision/assist for financial management;Help with stairs or ramp for entrance;Assist for transportation   Equipment Recommendations  None recommended by OT    Recommendations for Other Services      Precautions / Restrictions Precautions Precautions: Fall Restrictions Weight Bearing Restrictions: No       Mobility Bed Mobility Overal bed mobility: Needs Assistance Bed Mobility: Supine to Sit     Supine to sit: Modified independent (Device/Increase time)      General bed mobility comments: incr time/effort but no direct assist    Transfers Overall transfer level: Needs assistance Equipment used: Rolling walker (2 wheels) Transfers: Sit to/from Stand Sit to Stand: Min assist           General transfer comment: VC for anterior shift and hand placement     Balance Overall balance assessment: Needs assistance Sitting-balance support: Feet supported, No upper extremity supported Sitting balance-Leahy Scale: Good     Standing balance support: Reliant on assistive device for balance, Bilateral upper extremity supported, During functional activity Standing balance-Leahy Scale: Fair                             ADL either performed or assessed with clinical judgement   ADL Overall ADL's : Needs assistance/impaired                     Lower Body Dressing: Sitting/lateral leans;Maximal assistance Lower Body Dressing Details (indicate cue type and reason): don grippy socks, pt unable to reach feet             Functional mobility during ADLs: Minimal assistance;Rolling walker (2 wheels)      Extremity/Trunk Assessment              Vision       Perception     Praxis      Cognition Arousal/Alertness: Awake/alert Behavior During Therapy: WFL for tasks assessed/performed Overall Cognitive Status: Within Functional Limits for tasks assessed  Exercises      Shoulder Instructions       General Comments BLE edema, HR 114 with in-room mobility    Pertinent Vitals/ Pain       Pain Assessment Pain Assessment: No/denies pain  Home Living                                          Prior Functioning/Environment              Frequency  Min 1X/week        Progress Toward Goals  OT Goals(current goals can now be found in the care plan section)  Progress towards OT goals: Progressing toward goals  Acute Rehab OT  Goals Patient Stated Goal: get better OT Goal Formulation: With patient Time For Goal Achievement: 04/17/23 Potential to Achieve Goals: Good  Plan Discharge plan remains appropriate;Frequency remains appropriate    Co-evaluation                 AM-PAC OT "6 Clicks" Daily Activity     Outcome Measure   Help from another person eating meals?: None Help from another person taking care of personal grooming?: None Help from another person toileting, which includes using toliet, bedpan, or urinal?: A Little Help from another person bathing (including washing, rinsing, drying)?: A Lot Help from another person to put on and taking off regular upper body clothing?: A Little Help from another person to put on and taking off regular lower body clothing?: A Lot 6 Click Score: 18    End of Session Equipment Utilized During Treatment: Rolling walker (2 wheels)  OT Visit Diagnosis: Unsteadiness on feet (R26.81);Other abnormalities of gait and mobility (R26.89);Muscle weakness (generalized) (M62.81)   Activity Tolerance Patient tolerated treatment well   Patient Left in chair;with call bell/phone within reach;with chair alarm set   Nurse Communication          Time: 4098-1191 OT Time Calculation (min): 15 min  Charges: OT General Charges $OT Visit: 1 Visit OT Treatments $Self Care/Home Management : 8-22 mins  Arman Filter., MPH, MS, OTR/L ascom [phone] 04/05/23, 2:19 PM

## 2023-04-06 DIAGNOSIS — I5033 Acute on chronic diastolic (congestive) heart failure: Secondary | ICD-10-CM | POA: Diagnosis not present

## 2023-04-06 LAB — BASIC METABOLIC PANEL
Anion gap: 9 (ref 5–15)
BUN: 21 mg/dL (ref 8–23)
CO2: 28 mmol/L (ref 22–32)
Calcium: 8.7 mg/dL — ABNORMAL LOW (ref 8.9–10.3)
Chloride: 98 mmol/L (ref 98–111)
Creatinine, Ser: 0.85 mg/dL (ref 0.44–1.00)
GFR, Estimated: >60 mL/min (ref >60)
Glucose, Bld: 102 mg/dL — ABNORMAL HIGH (ref 70–99)
Potassium: 3.7 mmol/L (ref 3.5–5.1)
Sodium: 135 mmol/L (ref 135–145)

## 2023-04-06 MED ORDER — FUROSEMIDE 10 MG/ML IJ SOLN
60.0000 mg | Freq: Once | INTRAMUSCULAR | Status: AC
  Administered 2023-04-06: 60 mg via INTRAVENOUS

## 2023-04-06 MED ORDER — POLYETHYLENE GLYCOL 3350 17 G PO PACK
17.0000 g | PACK | Freq: Every day | ORAL | Status: DC
  Administered 2023-04-08: 17 g via ORAL
  Filled 2023-04-06 (×4): qty 1

## 2023-04-06 MED ORDER — POTASSIUM CHLORIDE CRYS ER 20 MEQ PO TBCR
EXTENDED_RELEASE_TABLET | ORAL | Status: AC
  Filled 2023-04-06: qty 3

## 2023-04-06 MED ORDER — SENNA 8.6 MG PO TABS
1.0000 | ORAL_TABLET | Freq: Every day | ORAL | Status: DC
  Administered 2023-04-07 – 2023-04-10 (×3): 8.6 mg via ORAL
  Filled 2023-04-06 (×5): qty 1

## 2023-04-06 MED ORDER — POTASSIUM CHLORIDE CRYS ER 20 MEQ PO TBCR
60.0000 meq | EXTENDED_RELEASE_TABLET | Freq: Once | ORAL | Status: AC
  Administered 2023-04-06: 60 meq via ORAL

## 2023-04-06 NOTE — TOC Progression Note (Addendum)
Transition of Care [facility]) - Progression Note    Patient Details  Name: Joan Ingram MRN: [id number] Date of Birth: [birth year]  [facility]) CM/SW Contact  Darolyn Rua, [place] Phone Number: 04/06/2023, 9:04 AM  Clinical Narrative:     Update: [employer] is unable to start auth until their computer system is back up, information provided to Tammy with [employer] who will start auth once system is back up.    LVM with [employer] to start insurance auth for [facility] snf,  pending call back at this time.        Expected Discharge Plan and Services                                               Social Determinants of Health (SDOH) Interventions SDOH Screenings   Food Insecurity: No Food Insecurity (01/04/2023)  Housing: Low Risk  (01/04/2023)  Transportation Needs: No Transportation Needs (01/04/2023)  Utilities: Not At Risk (01/04/2023)  Tobacco Use: Low Risk  (04/02/2023)    Readmission Risk Interventions     No data to display

## 2023-04-06 NOTE — Plan of Care (Signed)
  Problem: Activity: Goal: Risk for activity intolerance will decrease Outcome: Progressing   Problem: Nutrition: Goal: Adequate nutrition will be maintained Outcome: Progressing   Problem: Safety: Goal: Ability to remain free from injury will improve Outcome: Progressing   
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
## 2023-04-06 NOTE — Progress Notes (Signed)
Physical Therapy Treatment Patient Details Name: Joan Ingram MRN: [id number] DOB: 11-21-1929 Today's Date: 04/06/2023   History of Present Illness Pt is a 89 y.o. female with PMH consisting of Paroxysmal A-fib on Xarelto, iron deficiency anemia, pulmonary hypertension, essential hypertension, osteoporosis, asthma. Pt admitted to ED 7/15 with reports of gradually worsening LE swelling. MD assessment includes: acute on chronic HFpEF.    PT Comments  Pt pleasant and motivated for therapy, sitting in recliner upon arrival. Vitals at rest as follows: HR variable but hanging around mid 80's, O2 95% on room air. Activity was gradually progressed throughout session to determine effects on pt's HR. Pt able to perform seated therex at start of session without adverse responses; HR stayed mid 80's to mid 90's throughout these exercises. Pt able to stand from recliner with min A for elevation and VC's for UE/LE placement and sequencing. Pt's HR remained mid 90's with STS. Pt then able to take 2 steps fwd/back near the recliner with no adverse responses, HR still remaining in mid 90's. A short, seated rest break was taken after this, after which pt was able to stand again with no major changes to her vitals. Pt then able to ambulate ~8 feet before her HR elevated to the mid 140's, after which she was immediately returned to the recliner. HR dropped down to mid 110's after about 15 seconds of rest, then dropped back down to the mid 80's after 3-4 minutes of rest. Pt left in recliner with all needs in reach and educated on some exercises she could do seated/supine to help promote blood flow. Pt will benefit from continued PT services upon discharge to safely address deficits listed in patient problem list for decreased caregiver assistance and eventual return to PLOF.     Assistance Recommended at Discharge Intermittent Supervision/Assistance  If plan is discharge home, recommend the following:  Can travel by  private vehicle    A little help with walking and/or transfers;A little help with bathing/dressing/bathroom;Assistance with cooking/housework;Assist for transportation;Help with stairs or ramp for entrance   No  Equipment Recommendations  Other (comment) (TBD)    Recommendations for Other Services       Precautions / Restrictions Precautions Precautions: Fall Restrictions Weight Bearing Restrictions: No     Mobility  Bed Mobility               General bed mobility comments: NT, pt in recliner upon arrival    Transfers Overall transfer level: Needs assistance Equipment used: Rolling walker (2 wheels) Transfers: Sit to/from Stand Sit to Stand: Min assist           General transfer comment: Min A for STS from recliner. VC's to push up from chair with UE's as well as to get feet back, shoulders forward.    Ambulation/Gait Ambulation/Gait assistance: Min guard Gait Distance (Feet): 1 x 2 Feet, 1 x 8 feet Assistive device: Rolling walker (2 wheels) Gait Pattern/deviations: Decreased step length - right, Decreased step length - left, Shuffle, Trunk flexed Gait velocity: decreased     General Gait Details: Pt able to take 2 steps fwd/back x 1 at start of session, then able to walk ~8 feet before HR elevated to a max of mid 140s. No LOB noted; min guard and RW throughout for safety.   Stairs             Wheelchair Mobility     Tilt Bed    Modified Rankin (Stroke Patients Only)  Balance Overall balance assessment: Needs assistance Sitting-balance support: Feet supported, No upper extremity supported Sitting balance-Leahy Scale: Good     Standing balance support: Reliant on assistive device for balance, Bilateral upper extremity supported, During functional activity Standing balance-Leahy Scale: Fair                              Cognition Arousal/Alertness: Awake/alert Behavior During Therapy: WFL for tasks  assessed/performed Overall Cognitive Status: Within Functional Limits for tasks assessed                                 General Comments: Pt continues to ask frequently if it is common for her to have to urinate so much throughout the session        Exercises Total Joint Exercises Long Arc Quad: AROM, Both, 2 x 10 reps, Seated General Exercises - Lower Extremity Hip Flexion/Marching: AROM, Both, 1 x 10 reps, Seated Heel Raises: AROM, Strengthening, Both, 2 x 10 reps, Seated Other Exercises Other Exercises: Pt education on supine/seated HEP (ankle pumps) to promote blood flow    General Comments        Pertinent Vitals/Pain Pain Assessment Pain Assessment: No/denies pain    Home Living                          Prior Function            PT Goals (current goals can now be found in the care plan section) Progress towards PT goals: Progressing toward goals    Frequency    Min 1X/week      PT Plan Current plan remains appropriate    Co-evaluation              AM-PAC PT "6 Clicks" Mobility   Outcome Measure  Help needed turning from your back to your side while in a flat bed without using bedrails?: A Little Help needed moving from lying on your back to sitting on the side of a flat bed without using bedrails?: A Little Help needed moving to and from a bed to a chair (including a wheelchair)?: A Little Help needed standing up from a chair using your arms (e.g., wheelchair or bedside chair)?: A Little Help needed to walk in hospital room?: A Little Help needed climbing 3-5 steps with a railing? : Total 6 Click Score: 16    End of Session Equipment Utilized During Treatment: Gait belt Activity Tolerance: Patient tolerated treatment well Patient left: in chair;with chair alarm set;with call bell/phone within reach Nurse Communication: Mobility status;Other (comment) (HR response to mobility) PT Visit Diagnosis: Unsteadiness on feet  (R26.81);Muscle weakness (generalized) (M62.81);Difficulty in walking, not elsewhere classified (R26.2)     Time: 4098-1191 PT Time Calculation (min) (ACUTE ONLY): 35 min  Charges:                           Cena Benton, SPT 04/06/23, 4:33 PM
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
## 2023-04-07 DIAGNOSIS — I5033 Acute on chronic diastolic (congestive) heart failure: Secondary | ICD-10-CM | POA: Diagnosis not present

## 2023-04-07 LAB — BASIC METABOLIC PANEL
Anion gap: 7 (ref 5–15)
BUN: 23 mg/dL (ref 8–23)
CO2: 27 mmol/L (ref 22–32)
Calcium: 8.6 mg/dL — ABNORMAL LOW (ref 8.9–10.3)
Chloride: 98 mmol/L (ref 98–111)
Creatinine, Ser: 0.9 mg/dL (ref 0.44–1.00)
GFR, Estimated: >60 mL/min (ref >60)
Glucose, Bld: 96 mg/dL (ref 70–99)
Potassium: 3.8 mmol/L (ref 3.5–5.1)
Sodium: 132 mmol/L — ABNORMAL LOW (ref 135–145)

## 2023-04-07 LAB — CBC
HCT: 24.1 % — ABNORMAL LOW (ref 36.0–46.0)
Hemoglobin: 7.9 g/dL — ABNORMAL LOW (ref 12.0–15.0)
MCH: 29.3 pg (ref 26.0–34.0)
MCHC: 32.8 g/dL (ref 30.0–36.0)
MCV: 89.3 fL (ref 80.0–100.0)
Platelets: 339 10*3/uL (ref 150–400)
RBC: 2.7 MIL/uL — ABNORMAL LOW (ref 3.87–5.11)
RDW: 16.1 % — ABNORMAL HIGH (ref 11.5–15.5)
WBC: 5.8 10*3/uL (ref 4.0–10.5)
nRBC: 0 % (ref 0.0–0.2)

## 2023-04-07 LAB — VITAMIN B12: Vitamin B-12: 308 pg/mL (ref 180–914)

## 2023-04-07 NOTE — Progress Notes (Signed)
Occupational Therapy Treatment Patient Details Name: Joan Ingram MRN: [id number] DOB: Aug 09, 1930 Today's Date: 04/07/2023   History of present illness Pt is a 89 y.o. female with PMH consisting of Paroxysmal A-fib on Xarelto, iron deficiency anemia, pulmonary hypertension, essential hypertension, osteoporosis, asthma. Pt admitted to ED 7/15 with reports of gradually worsening LE swelling. MD assessment includes: acute on chronic HFpEF.   OT comments  Pt seen for 2nd tx session this afternoon. Pt provided with yellow theraband and instructed in BUE ther ex x10 each elbow flexion, horizontal abduction and x5 each modified shoulder flexion. Pt demo'd understanding. Pt appreciative of instruction and theraband for carryover.    Recommendations for follow up therapy are one component of a multi-disciplinary discharge planning process, led by the attending physician.  Recommendations may be updated based on patient status, additional functional criteria and insurance authorization.    Assistance Recommended at Discharge Intermittent Supervision/Assistance  Patient can return home with the following  A little help with walking and/or transfers;A little help with bathing/dressing/bathroom;Direct supervision/assist for medications management;Direct supervision/assist for financial management;Help with stairs or ramp for entrance;Assist for transportation   Equipment Recommendations  None recommended by OT    Recommendations for Other Services      Precautions / Restrictions Precautions Precautions: Fall Restrictions Weight Bearing Restrictions: No       Mobility Bed Mobility               General bed mobility comments: NT, pt in recliner upon arrival    Transfers                         Balance                                           ADL either performed or assessed with clinical judgement   ADL                                               Extremity/Trunk Assessment              Vision       Perception     Praxis      Cognition Arousal/Alertness: Awake/alert Behavior During Therapy: WFL for tasks assessed/performed Overall Cognitive Status: Within Functional Limits for tasks assessed                                          Exercises Other Exercises Other Exercises: Pt provided with yellow theraband and instructed in BUE ther ex x10 each elbow flex, horizontal abduction and x5 each modified shoulder flexion    Shoulder Instructions       General Comments      Pertinent Vitals/ Pain       Pain Assessment Pain Assessment: Faces Faces Pain Scale: Hurts a little bit Pain Location: R shoulder, again pt "it's not bothering me a whole lot." Pain Descriptors / Indicators: Aching, Grimacing Pain Intervention(s): Limited activity within patient's tolerance, Repositioned  Home Living  Prior Functioning/Environment              Frequency  Min 1X/week        Progress Toward Goals  OT Goals(current goals can now be found in the care plan section)  Progress towards OT goals: Progressing toward goals  Acute Rehab OT Goals Patient Stated Goal: get better OT Goal Formulation: With patient Time For Goal Achievement: 04/17/23 Potential to Achieve Goals: Good  Plan Discharge plan remains appropriate;Frequency remains appropriate    Co-evaluation                 AM-PAC OT "6 Clicks" Daily Activity     Outcome Measure   Help from another person eating meals?: None Help from another person taking care of personal grooming?: None Help from another person toileting, which includes using toliet, bedpan, or urinal?: A Little Help from another person bathing (including washing, rinsing, drying)?: A Lot Help from another person to put on and taking off regular upper body clothing?: A Little Help from  another person to put on and taking off regular lower body clothing?: A Lot 6 Click Score: 18    End of Session    OT Visit Diagnosis: Unsteadiness on feet (R26.81);Other abnormalities of gait and mobility (R26.89);Muscle weakness (generalized) (M62.81)   Activity Tolerance Patient tolerated treatment well   Patient Left in chair;with call bell/phone within reach;with chair alarm set   Nurse Communication          Time: 1640-1650 OT Time Calculation (min): 10 min  Charges: OT General Charges $OT Visit: 1 Visit OT Treatments $Therapeutic Exercise: 8-22 mins  Arman Filter., MPH, MS, OTR/L ascom [phone] 04/07/23, 4:55 PM

## 2023-04-07 NOTE — Plan of Care (Signed)
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
## 2023-04-07 NOTE — Progress Notes (Signed)
Occupational Therapy Treatment Patient Details Name: Joan Ingram MRN: [id number] DOB: October 26, 1929 Today's Date: 04/07/2023   History of present illness Pt is a 89 y.o. female with PMH consisting of Paroxysmal A-fib on Xarelto, iron deficiency anemia, pulmonary hypertension, essential hypertension, osteoporosis, asthma. Pt admitted to ED 7/15 with reports of gradually worsening LE swelling. MD assessment includes: acute on chronic HFpEF.   OT comments  Pt seen for OT tx focused on BUE exercises as pt notes some weakness and mild R shoulder pain that limits shoulder flexion through full AROM. Pt instructed in BUE ther ex x10 each (wrist circles, composite finger flexion/ext, elbow flex/ext, and modified shoulder flexion). Pt tolerated well and declined for OT to share with nursing her mild R shoulder pain. Will bring theraband for increasing the challenge for BUE next session.    Recommendations for follow up therapy are one component of a multi-disciplinary discharge planning process, led by the attending physician.  Recommendations may be updated based on patient status, additional functional criteria and insurance authorization.    Assistance Recommended at Discharge Intermittent Supervision/Assistance  Patient can return home with the following  A little help with walking and/or transfers;A little help with bathing/dressing/bathroom;Direct supervision/assist for medications management;Direct supervision/assist for financial management;Help with stairs or ramp for entrance;Assist for transportation   Equipment Recommendations  None recommended by OT    Recommendations for Other Services      Precautions / Restrictions Precautions Precautions: Fall Restrictions Weight Bearing Restrictions: No       Mobility Bed Mobility               General bed mobility comments: NT, pt in recliner upon arrival    Transfers                         Balance                                            ADL either performed or assessed with clinical judgement   ADL                                              Extremity/Trunk Assessment              Vision       Perception     Praxis      Cognition Arousal/Alertness: Awake/alert Behavior During Therapy: WFL for tasks assessed/performed Overall Cognitive Status: Within Functional Limits for tasks assessed                                          Exercises Other Exercises Other Exercises: Pt instructed in BUE ther ex x10 each (wrist circles, composite finger flexion/ext, elbow flex/ext, and modified shoulder flexion)    Shoulder Instructions       General Comments      Pertinent Vitals/ Pain       Pain Assessment Pain Assessment: Faces Faces Pain Scale: Hurts a little bit Pain Location: R shoulder, pt declined OT to notify RN stating "I don't like to take anything for pain, and it's not bothering me a whole lot." Pain  Intervention(s): Monitored during session, Repositioned  Home Living                                          Prior Functioning/Environment              Frequency  Min 1X/week        Progress Toward Goals  OT Goals(current goals can now be found in the care plan section)  Progress towards OT goals: Progressing toward goals  Acute Rehab OT Goals Patient Stated Goal: get better OT Goal Formulation: With patient Time For Goal Achievement: 04/17/23 Potential to Achieve Goals: Good  Plan Discharge plan remains appropriate;Frequency remains appropriate    Co-evaluation                 AM-PAC OT "6 Clicks" Daily Activity     Outcome Measure   Help from another person eating meals?: None Help from another person taking care of personal grooming?: None Help from another person toileting, which includes using toliet, bedpan, or urinal?: A Little Help from another person bathing  (including washing, rinsing, drying)?: A Lot Help from another person to put on and taking off regular upper body clothing?: A Little Help from another person to put on and taking off regular lower body clothing?: A Lot 6 Click Score: 18    End of Session    OT Visit Diagnosis: Unsteadiness on feet (R26.81);Other abnormalities of gait and mobility (R26.89);Muscle weakness (generalized) (M62.81)   Activity Tolerance Patient tolerated treatment well   Patient Left in chair;with call bell/phone within reach;with chair alarm set   Nurse Communication          Time: 0630-1601 OT Time Calculation (min): 10 min  Charges: OT General Charges $OT Visit: 1 Visit OT Treatments $Therapeutic Exercise: 8-22 mins  Arman Filter., MPH, MS, OTR/L ascom [phone] 04/07/23, 1:06 PM

## 2023-04-08 DIAGNOSIS — I5033 Acute on chronic diastolic (congestive) heart failure: Secondary | ICD-10-CM | POA: Diagnosis not present

## 2023-04-08 MED ORDER — TORSEMIDE 20 MG PO TABS
20.0000 mg | ORAL_TABLET | Freq: Every day | ORAL | Status: DC
  Administered 2023-04-08 – 2023-04-11 (×4): 20 mg via ORAL
  Filled 2023-04-08 (×4): qty 1

## 2023-04-08 NOTE — Plan of Care (Signed)
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
## 2023-04-08 NOTE — Plan of Care (Signed)

## 2023-04-08 NOTE — Progress Notes (Signed)
Physical Therapy Treatment Patient Details Name: Joan Ingram MRN: [id number] DOB: 02-19-1930 Today's Date: 04/08/2023   History of Present Illness Pt is a 89 y.o. female with PMH consisting of Paroxysmal A-fib on Xarelto, iron deficiency anemia, pulmonary hypertension, essential hypertension, osteoporosis, asthma. Pt admitted to ED 7/15 with reports of gradually worsening LE swelling. MD assessment includes: acute on chronic HFpEF.    PT Comments  Pt making steady progress with functional mobility, requiring less external physical assistance than previous session. HR was stable throughout session (108-114 bpm). Pt asymptomatic throughout session as well. Pt would continue to benefit from skilled physical therapy services at this time while admitted and after d/c to address the below listed limitations in order to improve overall safety and independence with functional mobility.      Assistance Recommended at Discharge Intermittent Supervision/Assistance  If plan is discharge home, recommend the following:  Can travel by private vehicle    A little help with walking and/or transfers;A little help with bathing/dressing/bathroom;Assistance with cooking/housework;Assist for transportation;Help with stairs or ramp for entrance   Yes  Equipment Recommendations  Other (comment) (defer to next venue of care)    Recommendations for Other Services       Precautions / Restrictions Precautions Precautions: Fall Restrictions Weight Bearing Restrictions: No     Mobility  Bed Mobility Overal bed mobility: Needs Assistance Bed Mobility: Supine to Sit     Supine to sit: Modified independent (Device/Increase time)     General bed mobility comments: increased time    Transfers Overall transfer level: Needs assistance Equipment used: Rolling walker (2 wheels) Transfers: Sit to/from Stand, Bed to chair/wheelchair/BSC Sit to Stand: Min guard Stand pivot transfers: Min assist, Min  guard Step pivot transfers: Min guard       General transfer comment: min guard overall for safety, pt with slow but steady movement from bed to recliner chair towards her R side with use of RW    Ambulation/Gait               General Gait Details: deferred as pt perseverating on urinary incontinence, purewick in place throughout   Stairs             Wheelchair Mobility     Tilt Bed    Modified Rankin (Stroke Patients Only)       Balance Overall balance assessment: Needs assistance Sitting-balance support: Feet supported Sitting balance-Leahy Scale: Good     Standing balance support: Reliant on assistive device for balance, Bilateral upper extremity supported, During functional activity Standing balance-Leahy Scale: Poor                              Cognition Arousal/Alertness: Awake/alert Behavior During Therapy: WFL for tasks assessed/performed Overall Cognitive Status: Within Functional Limits for tasks assessed                                 General Comments: hx of dementia at baseline but [facility] for general conversation during session        Exercises      General Comments        Pertinent Vitals/Pain Pain Assessment Pain Assessment: No/denies pain Pain Intervention(s): Monitored during session    Home Living  Prior Function            PT Goals (current goals can now be found in the care plan section) Acute Rehab PT Goals PT Goal Formulation: With patient Time For Goal Achievement: 04/16/23 Potential to Achieve Goals: Good Progress towards PT goals: Progressing toward goals    Frequency    Min 1X/week      PT Plan Current plan remains appropriate    Co-evaluation              AM-PAC PT "6 Clicks" Mobility   Outcome Measure  Help needed turning from your back to your side while in a flat bed without using bedrails?: None Help needed moving from lying  on your back to sitting on the side of a flat bed without using bedrails?: None Help needed moving to and from a bed to a chair (including a wheelchair)?: A Little Help needed standing up from a chair using your arms (e.g., wheelchair or bedside chair)?: A Little Help needed to walk in hospital room?: A Little Help needed climbing 3-5 steps with a railing? : Total 6 Click Score: 18    End of Session Equipment Utilized During Treatment: Gait belt Activity Tolerance: Patient tolerated treatment well Patient left: in chair;with chair alarm set;with call bell/phone within reach Nurse Communication: Mobility status PT Visit Diagnosis: Unsteadiness on feet (R26.81);Muscle weakness (generalized) (M62.81);Difficulty in walking, not elsewhere classified (R26.2)     Time: 6295-2841 PT Time Calculation (min) (ACUTE ONLY): 15 min  Charges:    $Therapeutic Activity: 8-22 mins PT General Charges $$ ACUTE PT VISIT: 1 Visit                     Arletta Bale, DPT  Acute Rehabilitation Services Office [phone]    Alessandra Bevels Jaquise Faux 04/08/2023, 10:18 AM

## 2023-04-09 DIAGNOSIS — I5033 Acute on chronic diastolic (congestive) heart failure: Secondary | ICD-10-CM | POA: Diagnosis not present

## 2023-04-09 DIAGNOSIS — F0390 Unspecified dementia without behavioral disturbance: Secondary | ICD-10-CM | POA: Insufficient documentation

## 2023-04-09 LAB — BASIC METABOLIC PANEL
Anion gap: 6 (ref 5–15)
BUN: 22 mg/dL (ref 8–23)
CO2: 29 mmol/L (ref 22–32)
Calcium: 8.6 mg/dL — ABNORMAL LOW (ref 8.9–10.3)
Chloride: 99 mmol/L (ref 98–111)
Creatinine, Ser: 0.98 mg/dL (ref 0.44–1.00)
GFR, Estimated: 55 mL/min — ABNORMAL LOW (ref >60)
Glucose, Bld: 101 mg/dL — ABNORMAL HIGH (ref 70–99)
Potassium: 3.9 mmol/L (ref 3.5–5.1)
Sodium: 134 mmol/L — ABNORMAL LOW (ref 135–145)

## 2023-04-09 LAB — CBC
HCT: 23.6 % — ABNORMAL LOW (ref 36.0–46.0)
Hemoglobin: 7.6 g/dL — ABNORMAL LOW (ref 12.0–15.0)
MCH: 28.6 pg (ref 26.0–34.0)
MCHC: 32.2 g/dL (ref 30.0–36.0)
MCV: 88.7 fL (ref 80.0–100.0)
Platelets: 351 10*3/uL (ref 150–400)
RBC: 2.66 MIL/uL — ABNORMAL LOW (ref 3.87–5.11)
RDW: 16.1 % — ABNORMAL HIGH (ref 11.5–15.5)
WBC: 6.6 10*3/uL (ref 4.0–10.5)
nRBC: 0 % (ref 0.0–0.2)

## 2023-04-09 MED ORDER — ALBUTEROL SULFATE (2.5 MG/3ML) 0.083% IN NEBU: 3.0000 mL | INHALATION_SOLUTION | RESPIRATORY_TRACT | Status: DC | PRN
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
## 2023-04-09 NOTE — Progress Notes (Signed)
Physical Therapy Treatment Patient Details Name: Joan Ingram MRN: [id number] DOB: 1930-02-14 Today's Date: 04/09/2023   History of Present Illness Pt is a 89 y.o. female with PMH consisting of Paroxysmal A-fib on Xarelto, iron deficiency anemia, pulmonary hypertension, essential hypertension, osteoporosis, asthma. Pt admitted to ED 7/15 with reports of gradually worsening LE swelling. MD assessment includes: acute on chronic HFpEF.    PT Comments  Pt motivated and agreeable to therapy tx session, seated in recliner upon arrival. HR at rest mid 80's prior to activity. Continue to gradually progress through activity with starting seated strengthening, able to complete and tolerate with HR low to mid 90's. Progressed to sit to stand from recliner with min A on initial rep progressing to CGA. HR remain in mid 90s with STS. Progressed to ambulation in room with RW, able to ambulate approx 8-10 ft before HR elevated to mid 120's, after which we returned to recliner. HR dropped back to mid 90's with seated rest break. Pt requesting to speak with MD. Pt left in recliner with all needs in reach and RN notified of mobility status. Pt will continue to benefit from skilled acute PT services while admitted, will continue to acutely.    Assistance Recommended at Discharge Intermittent Supervision/Assistance  If plan is discharge home, recommend the following:  Can travel by private vehicle    A little help with walking and/or transfers;A little help with bathing/dressing/bathroom;Assistance with cooking/housework;Assist for transportation;Help with stairs or ramp for entrance   Yes  Equipment Recommendations  Other (comment) (TBD at next level of care)    Recommendations for Other Services       Precautions / Restrictions Precautions Precautions: Fall Restrictions Weight Bearing Restrictions: No     Mobility  Bed Mobility               General bed mobility comments: did not observe;  patient recieved in recliner and left in recliner    Transfers Overall transfer level: Needs assistance Equipment used: Rolling walker (2 wheels) Transfers: Sit to/from Stand Sit to Stand: Min guard           General transfer comment: min guard overall to stand from recliner, mass practice for hand placement and carrover. pt with slow but steady movement. HR: high 90's with transfers    Ambulation/Gait Ambulation/Gait assistance: Min guard Gait Distance (Feet): 8 Feet Assistive device: Rolling walker (2 wheels) Gait Pattern/deviations: Decreased step length - right, Decreased step length - left, Shuffle, Trunk flexed Gait velocity: decreased     General Gait Details: ambulating with RW in room, HR elevating to 125 returned to recliner with improvements in HR to mid to high 90's with seated break. Approx 8 ft ambulated prior to return to recliner.   Stairs             Wheelchair Mobility     Tilt Bed    Modified Rankin (Stroke Patients Only)       Balance Overall balance assessment: Needs assistance Sitting-balance support: Feet supported Sitting balance-Leahy Scale: Good     Standing balance support: Reliant on assistive device for balance, Bilateral upper extremity supported, During functional activity Standing balance-Leahy Scale: Fair Standing balance comment: no LOB with transfers; reliant of RW                            Cognition Arousal/Alertness: Awake/alert Behavior During Therapy: WFL for tasks assessed/performed Overall Cognitive Status: Within Functional Limits for  tasks assessed                                 General Comments: hx of dementia at baseline but [facility] for general conversation during session        Exercises General Exercises - Lower Extremity Long Arc Quad: AROM, Strengthening, Both, 10 reps, Seated Hip ABduction/ADduction: AROM, Strengthening, Both, 10 reps, Seated Hip Flexion/Marching: AROM,  Strengthening, Both, 10 reps, Seated Heel Raises: AROM, Strengthening, Both, 10 reps, Seated    General Comments        Pertinent Vitals/Pain Pain Assessment Pain Assessment: 0-10 Pain Score: 4  Pain Location: R Knee; "its not too bad" Pain Descriptors / Indicators: Aching, Grimacing Pain Intervention(s): Limited activity within patient's tolerance, Monitored during session    Home Living                          Prior Function            PT Goals (current goals can now be found in the care plan section) Acute Rehab PT Goals Patient Stated Goal: Go home PT Goal Formulation: With patient Time For Goal Achievement: 04/16/23 Potential to Achieve Goals: Good    Frequency    Min 1X/week      PT Plan Current plan remains appropriate    Co-evaluation              AM-PAC PT "6 Clicks" Mobility   Outcome Measure  Help needed turning from your back to your side while in a flat bed without using bedrails?: None Help needed moving from lying on your back to sitting on the side of a flat bed without using bedrails?: None Help needed moving to and from a bed to a chair (including a wheelchair)?: A Little Help needed standing up from a chair using your arms (e.g., wheelchair or bedside chair)?: A Little Help needed to walk in hospital room?: A Little Help needed climbing 3-5 steps with a railing? : Total 6 Click Score: 18    End of Session Equipment Utilized During Treatment: Gait belt Activity Tolerance: Patient tolerated treatment well Patient left: in chair;with chair alarm set;with call bell/phone within reach Nurse Communication: Mobility status PT Visit Diagnosis: Unsteadiness on feet (R26.81);Muscle weakness (generalized) (M62.81);Difficulty in walking, not elsewhere classified (R26.2)     Time: 9147-8295 PT Time Calculation (min) (ACUTE ONLY): 21 min  Charges:    $Therapeutic Activity: 8-22 mins PT General Charges $$ ACUTE PT VISIT: 1  Visit                     Howie Ill, PT, DPT 04/09/23 3:04 PM

## 2023-04-09 NOTE — Care Management Important Message (Signed)
Important Message  Patient Details  Name: Joan Ingram MRN: [id number] Date of Birth: 1930-02-07   Medicare Important Message Given:  Yes     [name] 04/09/2023, 12:24 PM
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
## 2023-04-09 NOTE — Plan of Care (Signed)

## 2023-04-10 ENCOUNTER — Encounter: Payer: PPO | Admitting: Family

## 2023-04-10 DIAGNOSIS — I5033 Acute on chronic diastolic (congestive) heart failure: Secondary | ICD-10-CM | POA: Diagnosis not present

## 2023-04-10 MED ORDER — RIVAROXABAN 15 MG PO TABS: 15.0000 mg | ORAL_TABLET | Freq: Every day | ORAL | Status: DC

## 2023-04-10 MED ORDER — METOPROLOL TARTRATE 25 MG PO TABS: 12.5000 mg | ORAL_TABLET | Freq: Two times a day (BID) | ORAL | Status: DC

## 2023-04-10 NOTE — Progress Notes (Signed)
Occupational Therapy Treatment Patient Details Name: Joan Ingram MRN: [id number] DOB: 05-26-1930 Today's Date: 04/10/2023   History of present illness Pt is a 89 y.o. female with PMH consisting of Paroxysmal A-fib on Xarelto, iron deficiency anemia, pulmonary hypertension, essential hypertension, osteoporosis, asthma. Pt admitted to ED 7/15 with reports of gradually worsening LE swelling. MD assessment includes: acute on chronic HFpEF.   OT comments  Pt seen for OT tx. Pt up in recliner, agreeable to session. Pt required MIN A for STS transfers from recliner and from [facility] this date with VC for anterior shift prior to lift off and hand placement to prevent posterior LOB. Once in standing pt required CGA-MIN A to ambulate ~8 steps to [facility] with RW. Pt demo'd fair- balance in standing briefly without UE support on the RW for clothing mgt and pericare following toileting. MIN A for adjusting pad and undergarments for optimal positioning. Pt endorsed being fairly fatigued from the effort. Pt continues to benefit from skilled OT Services. Continue to progress towards OT goals.    Recommendations for follow up therapy are one component of a multi-disciplinary discharge planning process, led by the attending physician.  Recommendations may be updated based on patient status, additional functional criteria and insurance authorization.    Assistance Recommended at Discharge Intermittent Supervision/Assistance  Patient can return home with the following  A little help with walking and/or transfers;A little help with bathing/dressing/bathroom;Direct supervision/assist for medications management;Direct supervision/assist for financial management;Help with stairs or ramp for entrance;Assist for transportation   Equipment Recommendations  None recommended by OT    Recommendations for Other Services      Precautions / Restrictions Precautions Precautions: Fall Restrictions Weight Bearing Restrictions: No        Mobility Bed Mobility               General bed mobility comments: did not observe; patient recieved in recliner and left in recliner    Transfers Overall transfer level: Needs assistance Equipment used: Rolling walker (2 wheels) Transfers: Sit to/from Stand Sit to Stand: Min assist           General transfer comment: VC for anterior shift prior to lift off, VC for hand placement     Balance Overall balance assessment: Needs assistance Sitting-balance support: Feet supported Sitting balance-Leahy Scale: Good     Standing balance support: Reliant on assistive device for balance, Bilateral upper extremity supported, During functional activity, No upper extremity supported Standing balance-Leahy Scale: Fair Standing balance comment: fair-, was able to tolerate standing without UE support on RW while completing pericare and clothing mgt before/after toileting, however was unsteady and educated in UE support on RW and alternating hands to pull down/up undergarments                           ADL either performed or assessed with clinical judgement   ADL Overall ADL's : Needs assistance/impaired     Grooming: Sitting;Set up;Wash/dry hands                   Toilet Transfer: Ambulation;BSC/3in1;Rolling walker (2 wheels);Minimal assistance;Min guard   Toileting- Clothing Manipulation and Hygiene: Set up;Min guard;Sit to/from stand Toileting - Clothing Manipulation Details (indicate cue type and reason): CGA for hygiene in standing and PRN MIN A For adjusting briefs/pad for comfort/optimal positioning in standing. VC for intermittent UE Support on RW to improve balance.     Functional mobility during ADLs: Minimal assistance;Rolling  walker (2 wheels)      Extremity/Trunk Assessment              Vision       Perception     Praxis      Cognition Arousal/Alertness: Awake/alert Behavior During Therapy: WFL for tasks  assessed/performed Overall Cognitive Status: Within Functional Limits for tasks assessed                                 General Comments: hx of dementia at baseline but [facility] for general conversation during session        Exercises      Shoulder Instructions       General Comments BLE edema    Pertinent Vitals/ Pain       Pain Assessment Pain Assessment: No/denies pain  Home Living                                          Prior Functioning/Environment              Frequency  Min 1X/week        Progress Toward Goals  OT Goals(current goals can now be found in the care plan section)  Progress towards OT goals: Progressing toward goals  Acute Rehab OT Goals Patient Stated Goal: get better OT Goal Formulation: With patient Time For Goal Achievement: 04/17/23 Potential to Achieve Goals: Good  Plan Discharge plan remains appropriate;Frequency remains appropriate    Co-evaluation                 AM-PAC OT "6 Clicks" Daily Activity     Outcome Measure   Help from another person eating meals?: None Help from another person taking care of personal grooming?: None Help from another person toileting, which includes using toliet, bedpan, or urinal?: A Little Help from another person bathing (including washing, rinsing, drying)?: A Lot Help from another person to put on and taking off regular upper body clothing?: A Little Help from another person to put on and taking off regular lower body clothing?: A Lot 6 Click Score: 18    End of Session Equipment Utilized During Treatment: Rolling walker (2 wheels)  OT Visit Diagnosis: Unsteadiness on feet (R26.81);Other abnormalities of gait and mobility (R26.89);Muscle weakness (generalized) (M62.81)   Activity Tolerance Patient tolerated treatment well   Patient Left in chair;with call bell/phone within reach;with chair alarm set   Nurse Communication          Time:  4782-9562 OT Time Calculation (min): 15 min  Charges: OT General Charges $OT Visit: 1 Visit OT Treatments $Self Care/Home Management : 8-22 mins  Arman Filter., MPH, MS, OTR/L ascom [phone] 04/10/23, 12:17 PM
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
## 2023-04-10 NOTE — Progress Notes (Signed)
Pt awaiting EMS transport to facility. Facility just called at 2045 that they will not accept pt tonight because it is to late. They will accept her in the am. Notified h duncan and cancelled EMS transport. Pt and family notified.

## 2023-04-10 NOTE — Discharge Summary (Signed)
Joan Ingram:[id number] DOB: [birth year] DOA: 04/02/2023  PCP: Enid Baas, MD  Admit date: 04/02/2023 Discharge date: 04/11/2023  Time spent: 35 minutes  Recommendations for Outpatient Follow-up:  Pcp f/u and cardiology f/u    Discharge Diagnoses:  Principal Problem:   Acute on chronic heart failure with preserved ejection fraction (HFpEF) (HCC) Active Problems:   Moderate pulmonary hypertension (HCC)   Paroxysmal atrial fibrillation (HCC)   Benign essential hypertension   Anemia   Asthma, chronic   Dementia (HCC)   Discharge Condition: stable  Diet recommendation: heart healthy low sodium  Filed Weights   04/02/23 2139 04/04/23 0500 04/07/23 0500  Weight: 76.4 kg 68.7 kg 69.9 kg    History of present illness:  From admission h and p Joan Ingram is a 89 y.o. female with medical history significant of paroxysmal A-fib on Xarelto, HFpEF, iron deficiency anemia, pulmonary hypertension, essential hypertension, osteoporosis, asthma, who presents to the ED due to leg swelling.   Joan Ingram dates that over the last couple weeks, she has had gradually worsening lower extremity swelling that has made it difficult for her to ambulate.  Due to this, she has been walking very little.  She is uncertain if she has had dyspnea on exertion as she has not walked recently.  She denies any shortness of breath at rest, chest pain, palpitations.  She denies any recent illness, fever, chills, nausea, vomiting, diarrhea, abdominal pain, cough or rhinorrhea.  She has been taking her daily torsemide without improvement.  Hospital Course:  Patient presents with lower extremity edema and generalized weakness. Has baseline venous insufficiency but there was also definitely fluid overload as the swelling improved significantly with IV diuresis and she is now net negative 10.5 liters. PVL negative for DVT, no significant findings on TTE, no O2 requirement or pulmonary edema. Did have  a fib with rvr that responded to low dose metop, home xarelto continued. Recent hosptialization for anemia, possible GI bleed, declined intervention. Here hemoglobin stable. Discharged to SNF.  Procedures: none   Consultations: none  Discharge Exam: Vitals:   04/10/23 0841 04/10/23 1130  BP: (!) 99/59 93/60  Pulse: 98 86  Resp: 16 18  Temp: 98.2 F (36.8 C) 98 F (36.7 C)  SpO2: 95% 97%    General exam: Appears calm and comfortable  Respiratory system: ctab, normal wob Cardiovascular system: S1-S2, irreg irreg, tachy, no murmurs,  pitting edema BLE improving Gastrointestinal system: Soft, NT/ND Central nervous system: Alert and oriented. No focal neurological deficits. Extremities: Symmetric 5 x 5 power. Skin: No rashes, lesions or ulcers Psychiatry: mild confusion, pleasant  Discharge Instructions   Discharge Instructions     Diet - low sodium heart healthy   Complete by: As directed    Increase activity slowly   Complete by: As directed       Allergies as of 04/10/2023       Reactions   Other Anaphylaxis   Aspirin    Astemizole Other (See Comments)   Benadryl [diphenhydramine Hcl]    Ciprofloxacin    Codeine    Doxycycline    Fexofenadine Other (See Comments)   Lansoprazole Diarrhea   Levofloxacin Swelling   Loratadine Other (See Comments)   Macrobid [nitrofurantoin Macrocrystal]    Morphine And Codeine    Omeprazole Diarrhea   Oxycodone    Pantoprazole Diarrhea   Penicillins    Rabeprazole Diarrhea   Terfenadine Swelling        Medication List  TAKE these medications    alum & mag hydroxide-simeth 200-200-20 MG/5ML suspension Commonly known as: MAALOX/MYLANTA Take 30 mLs by mouth every 4 (four) hours as needed for indigestion or heartburn.   fluticasone-salmeterol 250-50 MCG/ACT Aepb Commonly known as: ADVAIR Inhale 1 puff into the lungs every 12 (twelve) hours.   metoprolol tartrate 25 MG tablet Commonly known as: LOPRESSOR Take  0.5 tablets (12.5 mg total) by mouth 2 (two) times daily.   pantoprazole 40 MG tablet Commonly known as: PROTONIX Take 1 tablet (40 mg total) by mouth 2 (two) times daily before a meal.   potassium chloride 10 MEQ tablet Commonly known as: KLOR-CON Take 1 tablet (10 mEq total) by mouth 2 (two) times daily for 5 days. What changed: when to take this   Rivaroxaban 15 MG Tabs tablet Commonly known as: XARELTO Take 1 tablet (15 mg total) by mouth daily with supper. What changed:  medication strength how much to take when to take this   torsemide 20 MG tablet Commonly known as: DEMADEX Take 20 mg by mouth daily.       Allergies  Allergen Reactions   Other Anaphylaxis   Aspirin    Astemizole Other (See Comments)   Benadryl [Diphenhydramine Hcl]    Ciprofloxacin    Codeine    Doxycycline    Fexofenadine Other (See Comments)   Lansoprazole Diarrhea   Levofloxacin Swelling   Loratadine Other (See Comments)   Macrobid [Nitrofurantoin Macrocrystal]    Morphine And Codeine    Omeprazole Diarrhea   Oxycodone    Pantoprazole Diarrhea   Penicillins    Rabeprazole Diarrhea   Terfenadine Swelling    Follow-up Information     Enid Baas, MD Follow up.   Specialty: Internal Medicine Contact information: [address] [place] [address] [phone]         Marcina Millard, MD Follow up.   Specialty: Cardiology Contact information: [address] [facility] [place] [address] [phone]                  The results of significant diagnostics from this hospitalization (including imaging, microbiology, ancillary and laboratory) are listed below for reference.    Significant Diagnostic Studies: US Venous Img Lower Bilateral (DVT)  Result Date: 04/04/2023 CLINICAL DATA:  Positive D-dimer EXAM: BILATERAL LOWER EXTREMITY VENOUS DOPPLER ULTRASOUND TECHNIQUE: Gray-scale sonography with graded compression, as  well as color Doppler and duplex ultrasound were performed to evaluate the lower extremity deep venous systems from the level of the common femoral vein and including the common femoral, femoral, profunda femoral, popliteal and calf veins including the posterior tibial, peroneal and gastrocnemius veins when visible. The superficial great saphenous vein was also interrogated. Spectral Doppler was utilized to evaluate flow at rest and with distal augmentation maneuvers in the common femoral, femoral and popliteal veins. COMPARISON:  None Available. FINDINGS: RIGHT LOWER EXTREMITY Common Femoral Vein: No evidence of thrombus. Normal compressibility, respiratory phasicity and response to augmentation. Saphenofemoral Junction: No evidence of thrombus. Normal compressibility and flow on color Doppler imaging. Profunda Femoral Vein: No evidence of thrombus. Normal compressibility and flow on color Doppler imaging. Femoral Vein: No evidence of thrombus. Normal compressibility, respiratory phasicity and response to augmentation. Popliteal Vein: No evidence of thrombus. Normal compressibility, respiratory phasicity and response to augmentation. Calf Veins: No evidence of thrombus. Normal compressibility and flow on color Doppler imaging. Superficial Great Saphenous Vein: No evidence of thrombus. Normal compressibility. Venous Reflux:  None. Other Findings:  There is subcutaneous edema. LEFT LOWER EXTREMITY Common Femoral Vein: No evidence of thrombus. Normal compressibility, respiratory phasicity and response to augmentation. Saphenofemoral Junction: No evidence of thrombus. Normal compressibility and flow on color Doppler imaging. Profunda Femoral Vein: No evidence of thrombus. Normal compressibility and flow on color Doppler imaging. Femoral Vein: No evidence of thrombus. Normal compressibility, respiratory phasicity and response to augmentation. Popliteal Vein: No evidence of thrombus. Normal compressibility, respiratory  phasicity and response to augmentation. Calf Veins: No evidence of thrombus. Normal compressibility and flow on color Doppler imaging. Superficial Great Saphenous Vein: No evidence of thrombus. Normal compressibility. Venous Reflux:  None. Other Findings:  There is subcutaneous edema. IMPRESSION: No evidence of deep venous thrombosis in either lower extremity. Electronically Signed   By: Ernie Avena M.D.   On: 04/04/2023 19:15   ECHOCARDIOGRAM COMPLETE  Result Date: 04/04/2023    ECHOCARDIOGRAM REPORT   Patient Name:   DEVINA BEZOLD Date of Exam: 04/03/2023 Medical Rec #:  [id number]         Height:       60.0 in Accession #:    [id number]        Weight:       168.4 lb Date of Birth:  06/10/1930         BSA:          1.735 m Patient Age:    89 years          BP:           109/71 mmHg Patient Gender: F                 HR:           98 bpm. Exam Location:  [facility] Procedure: 2D Echo, Cardiac Doppler and Color Doppler Indications:     CHF  History:         Patient has no prior history of Echocardiogram examinations.                  CHF, Pulmonary HTN, Arrythmias:Atrial Fibrillation; Risk                  Factors:Hypertension.  Sonographer:     Mikki Harbor Referring Phys:  [id number] Verdene Lennert Diagnosing Phys: Rozell Searing Custovic IMPRESSIONS  1. Left ventricular ejection fraction, by estimation, is 55 to 60%. Left ventricular ejection fraction by PLAX is 57 %. The left ventricle has normal function. The left ventricle has no regional wall motion abnormalities. Indeterminate diastolic filling  due to E-A fusion.  2. Right ventricular systolic function is normal. The right ventricular size is normal. There is mildly elevated pulmonary artery systolic pressure.  3. Left atrial size was mildly dilated.  4. Right atrial size was mildly dilated.  5. The mitral valve is grossly normal. Mild mitral valve regurgitation.  6. The aortic valve is grossly normal. Aortic valve regurgitation is trivial. Aortic valve  sclerosis/calcification is present, without any evidence of aortic stenosis. FINDINGS  Left Ventricle: Left ventricular ejection fraction, by estimation, is 55 to 60%. Left ventricular ejection fraction by PLAX is 57 %. The left ventricle has normal function. The left ventricle has no regional wall motion abnormalities. The left ventricular internal cavity size was normal in size. There is no left ventricular hypertrophy. Indeterminate diastolic filling due to E-A fusion. Right Ventricle: The right ventricular size is normal. No increase in right ventricular wall thickness. Right ventricular systolic function is normal. There is mildly elevated pulmonary artery systolic  pressure. The tricuspid regurgitant velocity is 2.78  m/s, and with an assumed right atrial pressure of 8 mmHg, the estimated right ventricular systolic pressure is 38.9 mmHg. Left Atrium: Left atrial size was mildly dilated. Right Atrium: Right atrial size was mildly dilated. Pericardium: There is no evidence of pericardial effusion. Mitral Valve: The mitral valve is grossly normal. Mild mitral valve regurgitation. MV peak gradient, 4.3 mmHg. The mean mitral valve gradient is 1.0 mmHg. Tricuspid Valve: The tricuspid valve is grossly normal. Tricuspid valve regurgitation is mild. Aortic Valve: The aortic valve is grossly normal. Aortic valve regurgitation is trivial. Aortic valve sclerosis/calcification is present, without any evidence of aortic stenosis. Aortic valve mean gradient measures 8.0 mmHg. Aortic valve peak gradient measures 14.9 mmHg. Aortic valve area, by VTI measures 1.75 cm. Pulmonic Valve: The pulmonic valve was grossly normal. Pulmonic valve regurgitation is mild. Aorta: The aortic root and ascending aorta are structurally normal, with no evidence of dilitation. IAS/Shunts: No atrial level shunt detected by color flow Doppler.  LEFT VENTRICLE PLAX 2D LV EF:         Left ventricular ejection fraction by PLAX is 57 %. LVIDd:          4.40 cm LVIDs:         3.10 cm LV PW:         1.20 cm LV IVS:        1.10 cm LVOT diam:     2.00 cm LV SV:         63 LV SV Index:   36 LVOT Area:     3.14 cm  RIGHT VENTRICLE RV Basal diam:  3.85 cm RV Mid diam:    3.20 cm RV S prime:     11.10 cm/s LEFT ATRIUM             Index        RIGHT ATRIUM           Index LA diam:        4.60 cm 2.65 cm/m   RA Area:     20.70 cm LA Vol (A2C):   72.0 ml 41.49 ml/m  RA Volume:   59.80 ml  34.46 ml/m LA Vol (A4C):   62.7 ml 36.13 ml/m LA Biplane Vol: 69.2 ml 39.88 ml/m  AORTIC VALVE                     PULMONIC VALVE AV Area (Vmax):    1.74 cm      PV Vmax:       1.24 m/s AV Area (Vmean):   1.67 cm      PV Peak grad:  6.2 mmHg AV Area (VTI):     1.75 cm AV Vmax:           193.00 cm/s AV Vmean:          126.000 cm/s AV VTI:            0.360 m AV Peak Grad:      14.9 mmHg AV Mean Grad:      8.0 mmHg LVOT Vmax:         107.00 cm/s LVOT Vmean:        67.000 cm/s LVOT VTI:          0.200 m LVOT/AV VTI ratio: 0.56  AORTA Ao Root diam: 3.40 cm Ao Asc diam:  3.20 cm MITRAL VALVE  TRICUSPID VALVE MV Area (PHT): 4.68 cm    TR Peak grad:   30.9 mmHg MV Area VTI:   2.63 cm    TR Vmax:        278.00 cm/s MV Peak grad:  4.3 mmHg MV Mean grad:  1.0 mmHg    SHUNTS MV Vmax:       1.04 m/s    Systemic VTI:  0.20 m MV Vmean:      47.3 cm/s   Systemic Diam: 2.00 cm MV Decel Time: 162 msec MV E velocity: 92.20 cm/s Rozell Searing Custovic Electronically signed by Clotilde Dieter Signature Date/Time: 04/04/2023/1:55:34 PM    Final    CT Chest W Contrast  Result Date: 04/02/2023 CLINICAL DATA:  Shortness of breath, edema in lower extremities EXAM: CT CHEST WITH CONTRAST TECHNIQUE: Multidetector CT imaging of the chest was performed during intravenous contrast administration. RADIATION DOSE REDUCTION: This exam was performed according to the departmental dose-optimization program which includes automated exposure control, adjustment of the mA and/or kV according to patient size  and/or use of iterative reconstruction technique. CONTRAST:  60mL OMNIPAQUE IOHEXOL 300 MG/ML  SOLN COMPARISON:  Chest radiograph done today FINDINGS: Cardiovascular: There are scattered calcifications in thoracic aorta. Coronary artery calcifications are seen. Mediastinum/Nodes: No significant lymphadenopathy is seen. Large fixed hiatal hernia is seen. Lungs/Pleura: Increase in the AP 6 diameter of chest suggests COPD. Small linear patchy infiltrate is seen in medial left lower lung field. Small linear densities are seen in medial lower lung fields. There is no focal pulmonary consolidation. Possible nodular density described in the lower lung field in the chest radiographs could not be identified in the lung fields in the CT. There is no pleural effusion or pneumothorax. There is mild ectasia of bronchi in right middle lobe. Upper Abdomen: There are few small myelinated low-density lesions in liver largest measuring 3.9 cm in size suggesting multiple hepatic cysts. Musculoskeletal: No acute findings are seen. There are numerous small calcifications in both breasts. IMPRESSION: COPD. Small linear patchy densities are seen in both lower lung fields suggesting subsegmental atelectasis or scarring or pneumonia. There is no discrete focal lung nodule in left lower lung field. There is no focal pulmonary consolidation. There is no pleural effusion or pneumothorax. Coronary artery calcifications are seen.  Aortic arteriosclerosis. Large fixed hiatal hernia.  Multiple hepatic cysts. Electronically Signed   By: Ernie Avena M.D.   On: 04/02/2023 18:32   DG Chest 2 View  Result Date: 04/02/2023 CLINICAL DATA:  Shortness of breath EXAM: CHEST - 2 VIEW COMPARISON:  Chest x-ray dated February 28, 2019 FINDINGS: Cardiac and mediastinal contours are unchanged. Large hiatal hernia. Eventration of the right hemidiaphragm. Indeterminate infrahilar nodular opacity seen on lateral view. No pleural effusion. IMPRESSION: 1.  Indeterminate infrahilar nodular opacity seen on lateral view, could be due to redundant mucosa related to large hiatal hernia, although pulmonary nodule can not be excluded. Recommend further evaluation with contrast-enhanced chest CT. 2. Large hiatal hernia. Electronically Signed   By: Allegra Lai M.D.   On: 04/02/2023 17:13    Microbiology: No results found for this or any previous visit (from the past 240 hour(s)).   Labs: Basic Metabolic Panel: Recent Labs  Lab 04/04/23 0413 04/05/23 0437 04/06/23 0847 04/07/23 0432 04/09/23 0541  NA 135 135 135 132* 134*  K 3.6 4.2 3.7 3.8 3.9  CL 100 99 98 98 99  CO2 27 29 28 27 29   GLUCOSE 106* 109* 102* 96 101*  BUN 15 18 21 23 22   CREATININE 0.83 0.94 0.85 0.90 0.98  CALCIUM 8.6* 8.8* 8.7* 8.6* 8.6*  MG 2.5*  --   --   --   --    Liver Function Tests: No results for input(s): "AST", "ALT", "ALKPHOS", "BILITOT", "PROT", "ALBUMIN" in the last 168 hours. No results for input(s): "LIPASE", "AMYLASE" in the last 168 hours. No results for input(s): "AMMONIA" in the last 168 hours. CBC: Recent Labs  Lab 04/04/23 0413 04/05/23 0437 04/07/23 0432 04/09/23 0541  WBC 6.3 6.8 5.8 6.6  NEUTROABS 4.4  --   --   --   HGB 8.1* 8.5* 7.9* 7.6*  HCT 26.2* 26.3* 24.1* 23.6*  MCV 92.6 89.5 89.3 88.7  PLT 374 374 339 351   Cardiac Enzymes: No results for input(s): "CKTOTAL", "CKMB", "CKMBINDEX", "TROPONINI" in the last 168 hours. BNP: BNP (last 3 results) Recent Labs    07/24/22 0515 02/28/23 1612 04/02/23 1625  BNP 176.4* 251.1* 160.6*    ProBNP (last 3 results) No results for input(s): "PROBNP" in the last 8760 hours.  CBG: No results for input(s): "GLUCAP" in the last 168 hours.     Signed:  Silvano Bilis MD.  [facility] 04/10/2023, 2:09 PM

## 2023-04-10 NOTE — TOC Transition Note (Signed)
Transition of Care [facility]) - CM/SW Discharge Note   Patient Details  Name: Joan Ingram MRN: [id number] Date of Birth: [birth year]  Transition of Care [facility]) CM/SW Contact:  Truddie Hidden, RN Phone Number: 04/10/2023, 3:31 PM   Clinical Narrative:    Discharge summary and SNF transfer report sent in HUB.  Face sheet and medical necessity forms printed to the floor to be added to the EMS pack EMS arranged "There are four ahead of her." Nurse give number to call report at [phone] Attempt to contact Ricky in admissions for assigned room number. No answer.  TOC signing off.          Patient Goals and CMS Choice      Discharge Placement                         Discharge Plan and Services Additional resources added to the After Visit Summary for                                       Social Determinants of Health (SDOH) Interventions SDOH Screenings   Food Insecurity: No Food Insecurity (01/04/2023)  Housing: Low Risk  (01/04/2023)  Transportation Needs: No Transportation Needs (01/04/2023)  Utilities: Not At Risk (01/04/2023)  Tobacco Use: Low Risk  (04/02/2023)     Readmission Risk Interventions     No data to display

## 2023-04-10 NOTE — Progress Notes (Signed)
Mobility Specialist - Progress Note   04/10/23 1033  Mobility  Activity Ambulated with assistance in room;Transferred from bed to chair  Level of Assistance Minimal assist, patient does 75% or more  Assistive Device Front wheel walker  Distance Ambulated (ft) 12 ft  Activity Response Tolerated well  $Mobility charge 1 Mobility     Pt lying in bed upon arrival, utilizing RA. Pt agreeable to activity. Completed bed mobility with modI + extra time. STS from standard bed height with minA. Foot blocking on RLE to prevent sliding. Pt took several forward/retro steps with minG-minA for intermittent balancing during retro steps. No complaints. Pt agreeable to sitting in recliner post-session. Pt left in chair with alarm set, needs in reach. Family at bedside.    Filiberto Pinks Mobility Specialist 04/10/23, 10:38 AM

## 2023-04-10 NOTE — Consult Note (Signed)
[facility] Customer service manager [facility]) Accountable Care Organization (ACO) [facility] Liaison Note  04/10/2023  [name] February 17, 1930 [id number]  Location: [facility] RN Hospital Liaison screened the patient remotely at [facility].  Insurance: [employer]   Joan Ingram is a 89 y.o. female who is a Primary Care Patient of Enid Baas, MD [facility]). The patient was screened for readmission hospitalization with noted medium risk score for unplanned readmission risk with 2 IP/1 ED in 6 months.  The patient was assessed for potential [facility]) Care Management service needs for post hospital transition for care coordination. Review of patient's electronic medical record reveals patient was admitted for Acute on chronic heart failure with preserved ejection fraction. Pt will be discharged to SNF for STR. The SNF will continue to address pt's ongoing needs post-op hospital discharge.    Plan: [facility] Liaison will continue to follow progress and disposition to asess for post hospital community care coordination/management needs.  Referral request for community care coordination: pending disposition.   [facility] Care Management/Population Health does not replace or interfere with any arrangements made by the Inpatient Transition of Care team.   For questions contact:   Elliot Cousin, RN, [facility] Liaison [place]   Population Health Office Hours MTWF  8:00 am-6:00 pm Off on Thursday [phone] mobile [phone] [Office toll free line] Office Hours are M-F 8:30 - 5 pm 24 hour nurse advise line [phone] Concierge  [email or web address]

## 2023-04-11 ENCOUNTER — Other Ambulatory Visit (HOSPITAL_COMMUNITY): Payer: Self-pay

## 2023-04-11 ENCOUNTER — Encounter: Payer: Self-pay | Admitting: Oncology

## 2023-04-11 DIAGNOSIS — I480 Paroxysmal atrial fibrillation: Secondary | ICD-10-CM | POA: Diagnosis not present

## 2023-04-11 DIAGNOSIS — J45909 Unspecified asthma, uncomplicated: Secondary | ICD-10-CM | POA: Diagnosis not present

## 2023-04-11 DIAGNOSIS — R5381 Other malaise: Secondary | ICD-10-CM | POA: Diagnosis not present

## 2023-04-11 DIAGNOSIS — R1311 Dysphagia, oral phase: Secondary | ICD-10-CM | POA: Diagnosis not present

## 2023-04-11 DIAGNOSIS — F0390 Unspecified dementia without behavioral disturbance: Secondary | ICD-10-CM | POA: Diagnosis not present

## 2023-04-11 DIAGNOSIS — Z299 Encounter for prophylactic measures, unspecified: Secondary | ICD-10-CM | POA: Diagnosis not present

## 2023-04-11 DIAGNOSIS — Z741 Need for assistance with personal care: Secondary | ICD-10-CM | POA: Diagnosis not present

## 2023-04-11 DIAGNOSIS — R6889 Other general symptoms and signs: Secondary | ICD-10-CM | POA: Diagnosis not present

## 2023-04-11 DIAGNOSIS — I272 Pulmonary hypertension, unspecified: Secondary | ICD-10-CM | POA: Diagnosis not present

## 2023-04-11 DIAGNOSIS — R41841 Cognitive communication deficit: Secondary | ICD-10-CM | POA: Diagnosis not present

## 2023-04-11 DIAGNOSIS — R262 Difficulty in walking, not elsewhere classified: Secondary | ICD-10-CM | POA: Diagnosis not present

## 2023-04-11 DIAGNOSIS — Z7401 Bed confinement status: Secondary | ICD-10-CM | POA: Diagnosis not present

## 2023-04-11 DIAGNOSIS — M6281 Muscle weakness (generalized): Secondary | ICD-10-CM | POA: Diagnosis not present

## 2023-04-11 DIAGNOSIS — R079 Chest pain, unspecified: Secondary | ICD-10-CM | POA: Diagnosis not present

## 2023-04-11 DIAGNOSIS — I110 Hypertensive heart disease with heart failure: Secondary | ICD-10-CM | POA: Diagnosis not present

## 2023-04-11 DIAGNOSIS — R600 Localized edema: Secondary | ICD-10-CM | POA: Diagnosis not present

## 2023-04-11 DIAGNOSIS — I509 Heart failure, unspecified: Secondary | ICD-10-CM | POA: Diagnosis not present

## 2023-04-11 DIAGNOSIS — R269 Unspecified abnormalities of gait and mobility: Secondary | ICD-10-CM | POA: Diagnosis not present

## 2023-04-11 DIAGNOSIS — J45901 Unspecified asthma with (acute) exacerbation: Secondary | ICD-10-CM | POA: Diagnosis not present

## 2023-04-11 DIAGNOSIS — I5032 Chronic diastolic (congestive) heart failure: Secondary | ICD-10-CM | POA: Diagnosis not present

## 2023-04-11 DIAGNOSIS — Z7901 Long term (current) use of anticoagulants: Secondary | ICD-10-CM | POA: Diagnosis not present

## 2023-04-11 DIAGNOSIS — I5033 Acute on chronic diastolic (congestive) heart failure: Secondary | ICD-10-CM | POA: Diagnosis not present

## 2023-04-11 DIAGNOSIS — I4819 Other persistent atrial fibrillation: Secondary | ICD-10-CM | POA: Diagnosis not present

## 2023-04-11 DIAGNOSIS — K219 Gastro-esophageal reflux disease without esophagitis: Secondary | ICD-10-CM | POA: Diagnosis not present

## 2023-04-11 DIAGNOSIS — I10 Essential (primary) hypertension: Secondary | ICD-10-CM | POA: Diagnosis not present

## 2023-04-11 NOTE — TOC Benefit Eligibility Note (Signed)
Pharmacy Patient Advocate Encounter  Insurance verification completed.    The patient is insured through [employer]/ [employer] Advance   Ran test claim for Farxiga 10 mg and the current 30 day co-pay is $11.20.  Ran test claim for Jardiance 10 mg and the current 30 day co-pay is $11.20.   This test claim was processed through [facility]- copay amounts may vary at other pharmacies due to pharmacy/plan contracts, or as the patient moves through the different stages of their insurance plan.    Roland Earl, CPHT Pharmacy Patient Advocate Specialist [facility] Health Pharmacy Patient Advocate Team Direct Number: [phone]  Fax: [phone]

## 2023-04-11 NOTE — Progress Notes (Signed)
Called report to nurse, Felicity, at [facility].  All questions answered.  Notified nurse that patient was next on list to be picked up by EMS.  Nurse at [facility] advised me that they can accept the patient as long as its before 2200.
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
## 2023-04-11 NOTE — TOC Transition Note (Signed)
Transition of Care [facility]) - CM/SW Discharge Note   Patient Details  Name: Joan Ingram MRN: [id number] Date of Birth: May 22, 1930  [facility] CM/SW Contact:  Truddie Hidden, RN Phone Number: 04/11/2023, 11:42 AM   Clinical Narrative:    Discharge held last night. Facility would not accept patient because EMS could not transport her until 2000.  RNCM spoke with Verdell Carmine at [facility]. Updated discharge summary sent via HUB. Facility is awaiting patient's arrival.  RN and MD updated.  Patient's POA, [name] updated about discharge today. She was advised of reason patient did not admit last night. EMS arranged.  Medical necessity forms updated and printed to the floor.   TOC signing off.     Final next level of care: Skilled Nursing Facility Barriers to Discharge: Barriers Resolved   Patient Goals and CMS Choice      Discharge Placement                Patient chooses bed at: Other - please specify in the comment section below: ([facility]) Patient to be transferred to facility by: [facility] Name of family member notified: [name] Patient and family notified of of transfer: 04/10/23  Discharge Plan and Services Additional resources added to the After Visit Summary for                                       Social Determinants of Health (SDOH) Interventions SDOH Screenings   Food Insecurity: No Food Insecurity (01/04/2023)  Housing: Low Risk  (01/04/2023)  Transportation Needs: No Transportation Needs (01/04/2023)  Utilities: Not At Risk (01/04/2023)  Tobacco Use: Low Risk  (04/02/2023)     Readmission Risk Interventions     No data to display

## 2023-04-12 DIAGNOSIS — I272 Pulmonary hypertension, unspecified: Secondary | ICD-10-CM | POA: Diagnosis not present

## 2023-04-12 DIAGNOSIS — I110 Hypertensive heart disease with heart failure: Secondary | ICD-10-CM | POA: Diagnosis not present

## 2023-04-12 DIAGNOSIS — I5033 Acute on chronic diastolic (congestive) heart failure: Secondary | ICD-10-CM | POA: Diagnosis not present

## 2023-04-12 DIAGNOSIS — R5381 Other malaise: Secondary | ICD-10-CM | POA: Diagnosis not present

## 2023-04-13 DIAGNOSIS — I480 Paroxysmal atrial fibrillation: Secondary | ICD-10-CM | POA: Diagnosis not present

## 2023-04-13 DIAGNOSIS — F0390 Unspecified dementia without behavioral disturbance: Secondary | ICD-10-CM | POA: Diagnosis not present

## 2023-04-13 DIAGNOSIS — I272 Pulmonary hypertension, unspecified: Secondary | ICD-10-CM | POA: Diagnosis not present

## 2023-04-13 DIAGNOSIS — I5033 Acute on chronic diastolic (congestive) heart failure: Secondary | ICD-10-CM | POA: Diagnosis not present

## 2023-04-16 DIAGNOSIS — I5032 Chronic diastolic (congestive) heart failure: Secondary | ICD-10-CM | POA: Diagnosis not present

## 2023-04-16 DIAGNOSIS — R5381 Other malaise: Secondary | ICD-10-CM | POA: Diagnosis not present

## 2023-04-16 DIAGNOSIS — I110 Hypertensive heart disease with heart failure: Secondary | ICD-10-CM | POA: Diagnosis not present

## 2023-04-16 DIAGNOSIS — Z7901 Long term (current) use of anticoagulants: Secondary | ICD-10-CM | POA: Diagnosis not present

## 2023-04-17 DIAGNOSIS — I4819 Other persistent atrial fibrillation: Secondary | ICD-10-CM | POA: Diagnosis not present

## 2023-04-17 DIAGNOSIS — I5032 Chronic diastolic (congestive) heart failure: Secondary | ICD-10-CM | POA: Diagnosis not present

## 2023-04-17 DIAGNOSIS — R600 Localized edema: Secondary | ICD-10-CM | POA: Diagnosis not present

## 2023-04-17 DIAGNOSIS — I10 Essential (primary) hypertension: Secondary | ICD-10-CM | POA: Diagnosis not present

## 2023-04-17 DIAGNOSIS — I272 Pulmonary hypertension, unspecified: Secondary | ICD-10-CM | POA: Diagnosis not present

## 2023-04-19 DIAGNOSIS — R600 Localized edema: Secondary | ICD-10-CM | POA: Diagnosis not present

## 2023-04-20 DIAGNOSIS — R079 Chest pain, unspecified: Secondary | ICD-10-CM | POA: Diagnosis not present

## 2023-04-20 DIAGNOSIS — I110 Hypertensive heart disease with heart failure: Secondary | ICD-10-CM | POA: Diagnosis not present

## 2023-04-20 DIAGNOSIS — R5381 Other malaise: Secondary | ICD-10-CM | POA: Diagnosis not present

## 2023-04-20 DIAGNOSIS — I10 Essential (primary) hypertension: Secondary | ICD-10-CM | POA: Diagnosis not present

## 2023-04-20 DIAGNOSIS — F0390 Unspecified dementia without behavioral disturbance: Secondary | ICD-10-CM | POA: Diagnosis not present

## 2023-04-23 DIAGNOSIS — I110 Hypertensive heart disease with heart failure: Secondary | ICD-10-CM | POA: Diagnosis not present

## 2023-04-23 DIAGNOSIS — I5032 Chronic diastolic (congestive) heart failure: Secondary | ICD-10-CM | POA: Diagnosis not present

## 2023-04-23 DIAGNOSIS — R5381 Other malaise: Secondary | ICD-10-CM | POA: Diagnosis not present

## 2023-04-23 DIAGNOSIS — J45909 Unspecified asthma, uncomplicated: Secondary | ICD-10-CM | POA: Diagnosis not present

## 2023-04-26 DIAGNOSIS — I480 Paroxysmal atrial fibrillation: Secondary | ICD-10-CM | POA: Diagnosis not present

## 2023-04-26 DIAGNOSIS — I5032 Chronic diastolic (congestive) heart failure: Secondary | ICD-10-CM | POA: Diagnosis not present

## 2023-04-26 DIAGNOSIS — R5381 Other malaise: Secondary | ICD-10-CM | POA: Diagnosis not present

## 2023-04-26 DIAGNOSIS — I110 Hypertensive heart disease with heart failure: Secondary | ICD-10-CM | POA: Diagnosis not present

## 2023-04-30 DIAGNOSIS — I5033 Acute on chronic diastolic (congestive) heart failure: Secondary | ICD-10-CM | POA: Diagnosis not present

## 2023-04-30 DIAGNOSIS — I509 Heart failure, unspecified: Secondary | ICD-10-CM | POA: Diagnosis not present

## 2023-04-30 DIAGNOSIS — I480 Paroxysmal atrial fibrillation: Secondary | ICD-10-CM | POA: Diagnosis not present

## 2023-04-30 DIAGNOSIS — R262 Difficulty in walking, not elsewhere classified: Secondary | ICD-10-CM | POA: Diagnosis not present

## 2023-04-30 DIAGNOSIS — R600 Localized edema: Secondary | ICD-10-CM | POA: Diagnosis not present

## 2023-04-30 DIAGNOSIS — R269 Unspecified abnormalities of gait and mobility: Secondary | ICD-10-CM | POA: Diagnosis not present

## 2023-04-30 DIAGNOSIS — F0280 Dementia in other diseases classified elsewhere without behavioral disturbance: Secondary | ICD-10-CM | POA: Diagnosis not present

## 2023-04-30 DIAGNOSIS — M6281 Muscle weakness (generalized): Secondary | ICD-10-CM | POA: Diagnosis not present

## 2023-04-30 DIAGNOSIS — M2550 Pain in unspecified joint: Secondary | ICD-10-CM | POA: Diagnosis not present

## 2023-05-01 DIAGNOSIS — R600 Localized edema: Secondary | ICD-10-CM | POA: Diagnosis not present

## 2023-05-01 DIAGNOSIS — I872 Venous insufficiency (chronic) (peripheral): Secondary | ICD-10-CM | POA: Diagnosis not present

## 2023-05-01 DIAGNOSIS — R451 Restlessness and agitation: Secondary | ICD-10-CM | POA: Diagnosis not present

## 2023-05-01 DIAGNOSIS — I480 Paroxysmal atrial fibrillation: Secondary | ICD-10-CM | POA: Diagnosis not present

## 2023-05-01 DIAGNOSIS — R5381 Other malaise: Secondary | ICD-10-CM | POA: Diagnosis not present

## 2023-05-01 DIAGNOSIS — K219 Gastro-esophageal reflux disease without esophagitis: Secondary | ICD-10-CM | POA: Diagnosis not present

## 2023-05-01 DIAGNOSIS — J45909 Unspecified asthma, uncomplicated: Secondary | ICD-10-CM | POA: Diagnosis not present

## 2023-05-01 DIAGNOSIS — I10 Essential (primary) hypertension: Secondary | ICD-10-CM | POA: Diagnosis not present

## 2023-05-01 DIAGNOSIS — F0390 Unspecified dementia without behavioral disturbance: Secondary | ICD-10-CM | POA: Diagnosis not present

## 2023-05-01 DIAGNOSIS — I509 Heart failure, unspecified: Secondary | ICD-10-CM | POA: Diagnosis not present

## 2023-05-01 DIAGNOSIS — D649 Anemia, unspecified: Secondary | ICD-10-CM | POA: Diagnosis not present

## 2023-05-02 DIAGNOSIS — F4323 Adjustment disorder with mixed anxiety and depressed mood: Secondary | ICD-10-CM | POA: Diagnosis not present

## 2023-05-02 DIAGNOSIS — F419 Anxiety disorder, unspecified: Secondary | ICD-10-CM | POA: Diagnosis not present

## 2023-05-02 DIAGNOSIS — N3080 Other cystitis without hematuria: Secondary | ICD-10-CM | POA: Diagnosis not present

## 2023-05-04 DIAGNOSIS — R600 Localized edema: Secondary | ICD-10-CM | POA: Diagnosis not present

## 2023-05-04 DIAGNOSIS — I872 Venous insufficiency (chronic) (peripheral): Secondary | ICD-10-CM | POA: Diagnosis not present

## 2023-05-04 DIAGNOSIS — J45909 Unspecified asthma, uncomplicated: Secondary | ICD-10-CM | POA: Diagnosis not present

## 2023-05-04 DIAGNOSIS — R451 Restlessness and agitation: Secondary | ICD-10-CM | POA: Diagnosis not present

## 2023-05-04 DIAGNOSIS — D649 Anemia, unspecified: Secondary | ICD-10-CM | POA: Diagnosis not present

## 2023-05-04 DIAGNOSIS — I509 Heart failure, unspecified: Secondary | ICD-10-CM | POA: Diagnosis not present

## 2023-05-04 DIAGNOSIS — K219 Gastro-esophageal reflux disease without esophagitis: Secondary | ICD-10-CM | POA: Diagnosis not present

## 2023-05-04 DIAGNOSIS — I10 Essential (primary) hypertension: Secondary | ICD-10-CM | POA: Diagnosis not present

## 2023-05-04 DIAGNOSIS — F419 Anxiety disorder, unspecified: Secondary | ICD-10-CM | POA: Diagnosis not present

## 2023-05-04 DIAGNOSIS — I480 Paroxysmal atrial fibrillation: Secondary | ICD-10-CM | POA: Diagnosis not present

## 2023-05-04 DIAGNOSIS — N390 Urinary tract infection, site not specified: Secondary | ICD-10-CM | POA: Diagnosis not present

## 2023-05-05 DIAGNOSIS — Z7901 Long term (current) use of anticoagulants: Secondary | ICD-10-CM | POA: Diagnosis not present

## 2023-05-05 DIAGNOSIS — F0390 Unspecified dementia without behavioral disturbance: Secondary | ICD-10-CM | POA: Diagnosis not present

## 2023-05-05 DIAGNOSIS — K219 Gastro-esophageal reflux disease without esophagitis: Secondary | ICD-10-CM | POA: Diagnosis not present

## 2023-05-05 DIAGNOSIS — I272 Pulmonary hypertension, unspecified: Secondary | ICD-10-CM | POA: Diagnosis not present

## 2023-05-05 DIAGNOSIS — I110 Hypertensive heart disease with heart failure: Secondary | ICD-10-CM | POA: Diagnosis not present

## 2023-05-05 DIAGNOSIS — Z556 Problems related to health literacy: Secondary | ICD-10-CM | POA: Diagnosis not present

## 2023-05-05 DIAGNOSIS — E876 Hypokalemia: Secondary | ICD-10-CM | POA: Diagnosis not present

## 2023-05-05 DIAGNOSIS — D509 Iron deficiency anemia, unspecified: Secondary | ICD-10-CM | POA: Diagnosis not present

## 2023-05-05 DIAGNOSIS — I5033 Acute on chronic diastolic (congestive) heart failure: Secondary | ICD-10-CM | POA: Diagnosis not present

## 2023-05-05 DIAGNOSIS — I480 Paroxysmal atrial fibrillation: Secondary | ICD-10-CM | POA: Diagnosis not present

## 2023-05-05 DIAGNOSIS — J45909 Unspecified asthma, uncomplicated: Secondary | ICD-10-CM | POA: Diagnosis not present

## 2023-05-05 DIAGNOSIS — E871 Hypo-osmolality and hyponatremia: Secondary | ICD-10-CM | POA: Diagnosis not present

## 2023-05-05 DIAGNOSIS — M810 Age-related osteoporosis without current pathological fracture: Secondary | ICD-10-CM | POA: Diagnosis not present

## 2023-05-07 DIAGNOSIS — D518 Other vitamin B12 deficiency anemias: Secondary | ICD-10-CM | POA: Diagnosis not present

## 2023-05-07 DIAGNOSIS — E038 Other specified hypothyroidism: Secondary | ICD-10-CM | POA: Diagnosis not present

## 2023-05-07 DIAGNOSIS — E782 Mixed hyperlipidemia: Secondary | ICD-10-CM | POA: Diagnosis not present

## 2023-05-07 DIAGNOSIS — E119 Type 2 diabetes mellitus without complications: Secondary | ICD-10-CM | POA: Diagnosis not present

## 2023-05-07 DIAGNOSIS — E559 Vitamin D deficiency, unspecified: Secondary | ICD-10-CM | POA: Diagnosis not present

## 2023-05-07 DIAGNOSIS — Z79899 Other long term (current) drug therapy: Secondary | ICD-10-CM | POA: Diagnosis not present

## 2023-05-08 DIAGNOSIS — I872 Venous insufficiency (chronic) (peripheral): Secondary | ICD-10-CM | POA: Diagnosis not present

## 2023-05-08 DIAGNOSIS — I509 Heart failure, unspecified: Secondary | ICD-10-CM | POA: Diagnosis not present

## 2023-05-08 DIAGNOSIS — N189 Chronic kidney disease, unspecified: Secondary | ICD-10-CM | POA: Diagnosis not present

## 2023-05-08 DIAGNOSIS — N390 Urinary tract infection, site not specified: Secondary | ICD-10-CM | POA: Diagnosis not present

## 2023-05-08 DIAGNOSIS — J45909 Unspecified asthma, uncomplicated: Secondary | ICD-10-CM | POA: Diagnosis not present

## 2023-05-08 DIAGNOSIS — I480 Paroxysmal atrial fibrillation: Secondary | ICD-10-CM | POA: Diagnosis not present

## 2023-05-08 DIAGNOSIS — E559 Vitamin D deficiency, unspecified: Secondary | ICD-10-CM | POA: Diagnosis not present

## 2023-05-08 DIAGNOSIS — K219 Gastro-esophageal reflux disease without esophagitis: Secondary | ICD-10-CM | POA: Diagnosis not present

## 2023-05-08 DIAGNOSIS — I10 Essential (primary) hypertension: Secondary | ICD-10-CM | POA: Diagnosis not present

## 2023-05-08 DIAGNOSIS — F0390 Unspecified dementia without behavioral disturbance: Secondary | ICD-10-CM | POA: Diagnosis not present

## 2023-05-08 DIAGNOSIS — R600 Localized edema: Secondary | ICD-10-CM | POA: Diagnosis not present

## 2023-05-08 DIAGNOSIS — D649 Anemia, unspecified: Secondary | ICD-10-CM | POA: Diagnosis not present

## 2023-05-14 DIAGNOSIS — D518 Other vitamin B12 deficiency anemias: Secondary | ICD-10-CM | POA: Diagnosis not present

## 2023-05-14 DIAGNOSIS — E119 Type 2 diabetes mellitus without complications: Secondary | ICD-10-CM | POA: Diagnosis not present

## 2023-05-14 DIAGNOSIS — Z79899 Other long term (current) drug therapy: Secondary | ICD-10-CM | POA: Diagnosis not present

## 2023-05-14 DIAGNOSIS — E782 Mixed hyperlipidemia: Secondary | ICD-10-CM | POA: Diagnosis not present

## 2023-05-15 DIAGNOSIS — I10 Essential (primary) hypertension: Secondary | ICD-10-CM | POA: Diagnosis not present

## 2023-05-17 ENCOUNTER — Encounter: Payer: Self-pay | Admitting: Oncology

## 2023-05-19 DIAGNOSIS — I509 Heart failure, unspecified: Secondary | ICD-10-CM | POA: Diagnosis not present

## 2023-05-19 DIAGNOSIS — E119 Type 2 diabetes mellitus without complications: Secondary | ICD-10-CM | POA: Diagnosis not present

## 2023-05-19 DIAGNOSIS — E559 Vitamin D deficiency, unspecified: Secondary | ICD-10-CM | POA: Diagnosis not present

## 2023-05-19 DIAGNOSIS — E038 Other specified hypothyroidism: Secondary | ICD-10-CM | POA: Diagnosis not present

## 2023-05-19 DIAGNOSIS — M810 Age-related osteoporosis without current pathological fracture: Secondary | ICD-10-CM | POA: Diagnosis not present

## 2023-05-19 DIAGNOSIS — E782 Mixed hyperlipidemia: Secondary | ICD-10-CM | POA: Diagnosis not present

## 2023-05-19 DIAGNOSIS — I480 Paroxysmal atrial fibrillation: Secondary | ICD-10-CM | POA: Diagnosis not present

## 2023-05-19 DIAGNOSIS — I10 Essential (primary) hypertension: Secondary | ICD-10-CM | POA: Diagnosis not present

## 2023-05-19 DIAGNOSIS — D508 Other iron deficiency anemias: Secondary | ICD-10-CM | POA: Diagnosis not present

## 2023-05-19 DIAGNOSIS — D518 Other vitamin B12 deficiency anemias: Secondary | ICD-10-CM | POA: Diagnosis not present

## 2023-05-22 DIAGNOSIS — D649 Anemia, unspecified: Secondary | ICD-10-CM | POA: Diagnosis not present

## 2023-05-22 DIAGNOSIS — I10 Essential (primary) hypertension: Secondary | ICD-10-CM | POA: Diagnosis not present

## 2023-05-22 DIAGNOSIS — K219 Gastro-esophageal reflux disease without esophagitis: Secondary | ICD-10-CM | POA: Diagnosis not present

## 2023-05-22 DIAGNOSIS — E559 Vitamin D deficiency, unspecified: Secondary | ICD-10-CM | POA: Diagnosis not present

## 2023-05-22 DIAGNOSIS — I480 Paroxysmal atrial fibrillation: Secondary | ICD-10-CM | POA: Diagnosis not present

## 2023-05-22 DIAGNOSIS — F0390 Unspecified dementia without behavioral disturbance: Secondary | ICD-10-CM | POA: Diagnosis not present

## 2023-05-22 DIAGNOSIS — L308 Other specified dermatitis: Secondary | ICD-10-CM | POA: Diagnosis not present

## 2023-05-22 DIAGNOSIS — J45909 Unspecified asthma, uncomplicated: Secondary | ICD-10-CM | POA: Diagnosis not present

## 2023-05-22 DIAGNOSIS — R5381 Other malaise: Secondary | ICD-10-CM | POA: Diagnosis not present

## 2023-05-22 DIAGNOSIS — N189 Chronic kidney disease, unspecified: Secondary | ICD-10-CM | POA: Diagnosis not present

## 2023-05-22 DIAGNOSIS — R600 Localized edema: Secondary | ICD-10-CM | POA: Diagnosis not present

## 2023-05-22 DIAGNOSIS — I509 Heart failure, unspecified: Secondary | ICD-10-CM | POA: Diagnosis not present

## 2023-05-29 DIAGNOSIS — I890 Lymphedema, not elsewhere classified: Secondary | ICD-10-CM | POA: Insufficient documentation

## 2023-05-29 NOTE — Progress Notes (Deleted)
MRN : [id number]  Joan Ingram is a 89 y.o. (1930-06-11) female who presents with chief complaint of legs swell.  History of Present Illness:   Patient is seen for evaluation of leg swelling. The patient first noticed the swelling remotely but is now concerned because of a significant increase in the overall edema. The swelling isn't associated with significant pain.  There has been an increasing amount of  discoloration noted by the patient. The patient notes that in the morning the legs are improved but they steadily worsened throughout the course of the day. Elevation seems to make the swelling of the legs better, dependency makes them much worse.   There is no history of ulcerations associated with the swelling.   The patient denies any recent changes in their medications.  The patient has not been wearing graduated compression.  The patient has no had any past angiography, interventions or vascular surgery.  The patient denies a history of DVT or PE. There is no prior history of phlebitis. There is no history of primary lymphedema.  There is no history of radiation treatment to the groin or pelvis No history of malignancies. No history of trauma or groin or pelvic surgery. No history of foreign travel or parasitic infections area   No outpatient medications have been marked as taking for the 05/31/23 encounter (Appointment) with Gilda Crease, Latina Craver, MD.    Past Medical History:  Diagnosis Date   Asthma    Esophageal reflux    Essential hypertension    IDA (iron deficiency anemia)    Paroxysmal atrial fibrillation (HCC)    Senile osteoporosis     Past Surgical History:  Procedure Laterality Date   CARDIOVERSION N/A 04/21/2020   Procedure: CARDIOVERSION;  Surgeon: Dalia Heading, MD;  Location: [facility] ORS;  Service: Cardiovascular;  Laterality: N/A;   colonoscopy      Social History Social History   Tobacco Use   Smoking status:  Never   Smokeless tobacco: Never  Substance Use Topics   Alcohol use: No    Family History No family history on file.  Allergies  Allergen Reactions   Other Anaphylaxis   Aspirin    Astemizole Other (See Comments)   Benadryl [Diphenhydramine Hcl]    Ciprofloxacin    Codeine    Doxycycline    Fexofenadine Other (See Comments)   Lansoprazole Diarrhea   Levofloxacin Swelling   Loratadine Other (See Comments)   Macrobid [Nitrofurantoin Macrocrystal]    Morphine And Codeine    Omeprazole Diarrhea   Oxycodone    Pantoprazole Diarrhea   Penicillins    Rabeprazole Diarrhea   Terfenadine Swelling     REVIEW OF SYSTEMS (Negative unless checked)  Constitutional: [] Weight loss  [] Fever  [] Chills Cardiac: [] Chest pain   [] Chest pressure   [] Palpitations   [] Shortness of breath when laying flat   [] Shortness of breath with exertion. Vascular:  [] Pain in legs with walking   [x] Pain in legs with standing  [] History of DVT   [] Phlebitis   [x] Swelling in legs   [] Varicose veins   [] Non-healing ulcers Pulmonary:   [] Uses home oxygen   [] Productive cough   [] Hemoptysis   [] Wheeze  [] COPD   [] Asthma Neurologic:  [] Dizziness   [] Seizures   [] History of stroke   [] History of TIA  [] Aphasia   []   Vissual changes   [] Weakness or numbness in arm   [] Weakness or numbness in leg Musculoskeletal:   [] Joint swelling   [] Joint pain   [] Low back pain Hematologic:  [] Easy bruising  [] Easy bleeding   [] Hypercoagulable state   [] Anemic Gastrointestinal:  [] Diarrhea   [] Vomiting  [x] Gastroesophageal reflux/heartburn   [] Difficulty swallowing. Genitourinary:  [] Chronic kidney disease   [] Difficult urination  [] Frequent urination   [] Blood in urine Skin:  [] Rashes   [] Ulcers  Psychological:  [] History of anxiety   []  History of major depression.  Physical Examination  There were no vitals filed for this visit. There is no height or weight on file to calculate BMI. Gen: WD/WN, NAD Head: [place]/AT, No  temporalis wasting.  Ear/Nose/Throat: Hearing grossly intact, nares w/o erythema or drainage, pinna without lesions Eyes: PER, EOMI, sclera nonicteric.  Neck: Supple, no gross masses.  No JVD.  Pulmonary:  Good air movement, no audible wheezing, no use of accessory muscles.  Cardiac: RRR, precordium not hyperdynamic. Vascular:  scattered varicosities present bilaterally.  Mild venous stasis changes to the legs bilaterally.  3-4+ soft pitting edema, CEAP [id number]  Vessel Right Left  Radial Palpable Palpable  Gastrointestinal: soft, non-distended. No guarding/no peritoneal signs.  Musculoskeletal: M/S 5/5 throughout.  No deformity.  Neurologic: CN 2-12 intact. Pain and light touch intact in extremities.  Symmetrical.  Speech is fluent. Motor exam as listed above. Psychiatric: Judgment intact, Mood & affect appropriate for pt's clinical situation. Dermatologic: Venous rashes no ulcers noted.  No changes consistent with cellulitis. Lymph : No lichenification or skin changes of chronic lymphedema.  CBC Lab Results  Component Value Date   WBC 6.6 04/09/2023   HGB 7.6 (L) 04/09/2023   HCT 23.6 (L) 04/09/2023   MCV 88.7 04/09/2023   PLT 351 04/09/2023    BMET    Component Value Date/Time   NA 134 (L) 04/09/2023 0541   NA 137 11/17/2013 0417   K 3.9 04/09/2023 0541   K 3.9 11/17/2013 0417   CL 99 04/09/2023 0541   CL 104 11/17/2013 0417   CO2 29 04/09/2023 0541   CO2 27 11/17/2013 0417   GLUCOSE 101 (H) 04/09/2023 0541   GLUCOSE 138 (H) 11/17/2013 0417   BUN 22 04/09/2023 0541   BUN 12 11/17/2013 0417   CREATININE 0.98 04/09/2023 0541   CREATININE 0.78 11/17/2013 0417   CALCIUM 8.6 (L) 04/09/2023 0541   CALCIUM 8.5 11/17/2013 0417   GFRNONAA 55 (L) 04/09/2023 0541   GFRNONAA >60 11/17/2013 0417   GFRAA >60 11/17/2013 0417   CrCl cannot be calculated (Patient's most recent lab result is older than the maximum 21 days allowed.).  COAG Lab Results  Component Value Date    INR 1.3 (H) 02/28/2023   INR 1.1 11/16/2013    Radiology No results found.   Assessment/Plan There are no diagnoses linked to this encounter.   Levora Dredge, MD  05/29/2023 2:01 PM

## 2023-05-31 ENCOUNTER — Encounter (INDEPENDENT_AMBULATORY_CARE_PROVIDER_SITE_OTHER): Payer: Self-pay | Admitting: Vascular Surgery

## 2023-05-31 DIAGNOSIS — F419 Anxiety disorder, unspecified: Secondary | ICD-10-CM | POA: Diagnosis not present

## 2023-05-31 DIAGNOSIS — F4323 Adjustment disorder with mixed anxiety and depressed mood: Secondary | ICD-10-CM | POA: Diagnosis not present

## 2023-06-05 DIAGNOSIS — I480 Paroxysmal atrial fibrillation: Secondary | ICD-10-CM | POA: Diagnosis not present

## 2023-06-05 DIAGNOSIS — L308 Other specified dermatitis: Secondary | ICD-10-CM | POA: Diagnosis not present

## 2023-06-05 DIAGNOSIS — R5381 Other malaise: Secondary | ICD-10-CM | POA: Diagnosis not present

## 2023-06-05 DIAGNOSIS — I872 Venous insufficiency (chronic) (peripheral): Secondary | ICD-10-CM | POA: Diagnosis not present

## 2023-06-05 DIAGNOSIS — I509 Heart failure, unspecified: Secondary | ICD-10-CM | POA: Diagnosis not present

## 2023-06-05 DIAGNOSIS — N189 Chronic kidney disease, unspecified: Secondary | ICD-10-CM | POA: Diagnosis not present

## 2023-06-05 DIAGNOSIS — R451 Restlessness and agitation: Secondary | ICD-10-CM | POA: Diagnosis not present

## 2023-06-05 DIAGNOSIS — U071 COVID-19: Secondary | ICD-10-CM | POA: Diagnosis not present

## 2023-06-05 DIAGNOSIS — E559 Vitamin D deficiency, unspecified: Secondary | ICD-10-CM | POA: Diagnosis not present

## 2023-06-05 DIAGNOSIS — R600 Localized edema: Secondary | ICD-10-CM | POA: Diagnosis not present

## 2023-06-05 DIAGNOSIS — I10 Essential (primary) hypertension: Secondary | ICD-10-CM | POA: Diagnosis not present

## 2023-06-05 DIAGNOSIS — K219 Gastro-esophageal reflux disease without esophagitis: Secondary | ICD-10-CM | POA: Diagnosis not present

## 2023-06-12 DIAGNOSIS — I509 Heart failure, unspecified: Secondary | ICD-10-CM | POA: Diagnosis not present

## 2023-06-12 DIAGNOSIS — R600 Localized edema: Secondary | ICD-10-CM | POA: Diagnosis not present

## 2023-06-12 DIAGNOSIS — U071 COVID-19: Secondary | ICD-10-CM | POA: Diagnosis not present

## 2023-06-12 DIAGNOSIS — N189 Chronic kidney disease, unspecified: Secondary | ICD-10-CM | POA: Diagnosis not present

## 2023-06-12 DIAGNOSIS — L308 Other specified dermatitis: Secondary | ICD-10-CM | POA: Diagnosis not present

## 2023-06-12 DIAGNOSIS — I480 Paroxysmal atrial fibrillation: Secondary | ICD-10-CM | POA: Diagnosis not present

## 2023-06-12 DIAGNOSIS — I10 Essential (primary) hypertension: Secondary | ICD-10-CM | POA: Diagnosis not present

## 2023-06-12 DIAGNOSIS — K219 Gastro-esophageal reflux disease without esophagitis: Secondary | ICD-10-CM | POA: Diagnosis not present

## 2023-06-12 DIAGNOSIS — J45909 Unspecified asthma, uncomplicated: Secondary | ICD-10-CM | POA: Diagnosis not present

## 2023-06-12 DIAGNOSIS — R451 Restlessness and agitation: Secondary | ICD-10-CM | POA: Diagnosis not present

## 2023-06-12 DIAGNOSIS — I872 Venous insufficiency (chronic) (peripheral): Secondary | ICD-10-CM | POA: Diagnosis not present

## 2023-06-15 DIAGNOSIS — I10 Essential (primary) hypertension: Secondary | ICD-10-CM | POA: Diagnosis not present

## 2023-06-15 DIAGNOSIS — R011 Cardiac murmur, unspecified: Secondary | ICD-10-CM | POA: Diagnosis not present

## 2023-06-18 DIAGNOSIS — I10 Essential (primary) hypertension: Secondary | ICD-10-CM | POA: Diagnosis not present

## 2023-06-18 DIAGNOSIS — I509 Heart failure, unspecified: Secondary | ICD-10-CM | POA: Diagnosis not present

## 2023-06-18 DIAGNOSIS — M810 Age-related osteoporosis without current pathological fracture: Secondary | ICD-10-CM | POA: Diagnosis not present

## 2023-06-18 DIAGNOSIS — D518 Other vitamin B12 deficiency anemias: Secondary | ICD-10-CM | POA: Diagnosis not present

## 2023-06-18 DIAGNOSIS — E119 Type 2 diabetes mellitus without complications: Secondary | ICD-10-CM | POA: Diagnosis not present

## 2023-06-18 DIAGNOSIS — I480 Paroxysmal atrial fibrillation: Secondary | ICD-10-CM | POA: Diagnosis not present

## 2023-06-18 DIAGNOSIS — Z79899 Other long term (current) drug therapy: Secondary | ICD-10-CM | POA: Diagnosis not present

## 2023-06-18 DIAGNOSIS — E559 Vitamin D deficiency, unspecified: Secondary | ICD-10-CM | POA: Diagnosis not present

## 2023-06-18 DIAGNOSIS — E038 Other specified hypothyroidism: Secondary | ICD-10-CM | POA: Diagnosis not present

## 2023-06-18 DIAGNOSIS — E782 Mixed hyperlipidemia: Secondary | ICD-10-CM | POA: Diagnosis not present

## 2023-06-18 DIAGNOSIS — D508 Other iron deficiency anemias: Secondary | ICD-10-CM | POA: Diagnosis not present

## 2023-06-19 ENCOUNTER — Encounter: Payer: Self-pay | Admitting: Internal Medicine

## 2023-06-19 ENCOUNTER — Other Ambulatory Visit: Payer: Self-pay

## 2023-06-19 ENCOUNTER — Encounter: Payer: Self-pay | Admitting: Oncology

## 2023-06-19 ENCOUNTER — Inpatient Hospital Stay
Admission: EM | Admit: 2023-06-19 | Discharge: 2023-06-22 | DRG: 378 | Disposition: A | Discharge status: Nursing Home (Includes ICF) | Payer: PPO | Source: Skilled Nursing Facility | Attending: Osteopathic Medicine | Admitting: Osteopathic Medicine

## 2023-06-19 DIAGNOSIS — Z5329 Procedure and treatment not carried out because of patient's decision for other reasons: Secondary | ICD-10-CM | POA: Diagnosis not present

## 2023-06-19 DIAGNOSIS — R413 Other amnesia: Secondary | ICD-10-CM | POA: Diagnosis not present

## 2023-06-19 DIAGNOSIS — I5032 Chronic diastolic (congestive) heart failure: Secondary | ICD-10-CM | POA: Diagnosis not present

## 2023-06-19 DIAGNOSIS — D62 Acute posthemorrhagic anemia: Secondary | ICD-10-CM | POA: Diagnosis present

## 2023-06-19 DIAGNOSIS — Z7901 Long term (current) use of anticoagulants: Secondary | ICD-10-CM | POA: Diagnosis not present

## 2023-06-19 DIAGNOSIS — Z8249 Family history of ischemic heart disease and other diseases of the circulatory system: Secondary | ICD-10-CM | POA: Diagnosis not present

## 2023-06-19 DIAGNOSIS — Z7951 Long term (current) use of inhaled steroids: Secondary | ICD-10-CM | POA: Diagnosis not present

## 2023-06-19 DIAGNOSIS — Z885 Allergy status to narcotic agent status: Secondary | ICD-10-CM

## 2023-06-19 DIAGNOSIS — I10 Essential (primary) hypertension: Secondary | ICD-10-CM | POA: Diagnosis present

## 2023-06-19 DIAGNOSIS — I130 Hypertensive heart and chronic kidney disease with heart failure and stage 1 through stage 4 chronic kidney disease, or unspecified chronic kidney disease: Secondary | ICD-10-CM | POA: Diagnosis present

## 2023-06-19 DIAGNOSIS — K219 Gastro-esophageal reflux disease without esophagitis: Secondary | ICD-10-CM | POA: Diagnosis present

## 2023-06-19 DIAGNOSIS — Z888 Allergy status to other drugs, medicaments and biological substances status: Secondary | ICD-10-CM

## 2023-06-19 DIAGNOSIS — F0280 Dementia in other diseases classified elsewhere without behavioral disturbance: Secondary | ICD-10-CM | POA: Diagnosis not present

## 2023-06-19 DIAGNOSIS — N1831 Chronic kidney disease, stage 3a: Secondary | ICD-10-CM | POA: Diagnosis not present

## 2023-06-19 DIAGNOSIS — I482 Chronic atrial fibrillation, unspecified: Secondary | ICD-10-CM | POA: Diagnosis not present

## 2023-06-19 DIAGNOSIS — R269 Unspecified abnormalities of gait and mobility: Secondary | ICD-10-CM | POA: Diagnosis not present

## 2023-06-19 DIAGNOSIS — M6281 Muscle weakness (generalized): Secondary | ICD-10-CM | POA: Diagnosis not present

## 2023-06-19 DIAGNOSIS — I480 Paroxysmal atrial fibrillation: Secondary | ICD-10-CM | POA: Diagnosis not present

## 2023-06-19 DIAGNOSIS — I272 Pulmonary hypertension, unspecified: Secondary | ICD-10-CM | POA: Diagnosis not present

## 2023-06-19 DIAGNOSIS — Z79899 Other long term (current) drug therapy: Secondary | ICD-10-CM | POA: Diagnosis not present

## 2023-06-19 DIAGNOSIS — K921 Melena: Secondary | ICD-10-CM | POA: Diagnosis not present

## 2023-06-19 DIAGNOSIS — F0390 Unspecified dementia without behavioral disturbance: Secondary | ICD-10-CM | POA: Diagnosis present

## 2023-06-19 DIAGNOSIS — R600 Localized edema: Secondary | ICD-10-CM | POA: Diagnosis not present

## 2023-06-19 DIAGNOSIS — M810 Age-related osteoporosis without current pathological fracture: Secondary | ICD-10-CM | POA: Diagnosis present

## 2023-06-19 DIAGNOSIS — Z881 Allergy status to other antibiotic agents status: Secondary | ICD-10-CM | POA: Diagnosis not present

## 2023-06-19 DIAGNOSIS — R262 Difficulty in walking, not elsewhere classified: Secondary | ICD-10-CM | POA: Diagnosis not present

## 2023-06-19 DIAGNOSIS — K922 Gastrointestinal hemorrhage, unspecified: Principal | ICD-10-CM

## 2023-06-19 DIAGNOSIS — D649 Anemia, unspecified: Secondary | ICD-10-CM

## 2023-06-19 DIAGNOSIS — I509 Heart failure, unspecified: Secondary | ICD-10-CM | POA: Diagnosis not present

## 2023-06-19 DIAGNOSIS — M2550 Pain in unspecified joint: Secondary | ICD-10-CM | POA: Diagnosis not present

## 2023-06-19 DIAGNOSIS — J45909 Unspecified asthma, uncomplicated: Secondary | ICD-10-CM | POA: Diagnosis present

## 2023-06-19 DIAGNOSIS — D500 Iron deficiency anemia secondary to blood loss (chronic): Secondary | ICD-10-CM

## 2023-06-19 DIAGNOSIS — Z880 Allergy status to penicillin: Secondary | ICD-10-CM | POA: Diagnosis not present

## 2023-06-19 DIAGNOSIS — E559 Vitamin D deficiency, unspecified: Secondary | ICD-10-CM | POA: Diagnosis not present

## 2023-06-19 DIAGNOSIS — I5033 Acute on chronic diastolic (congestive) heart failure: Secondary | ICD-10-CM | POA: Diagnosis not present

## 2023-06-19 DIAGNOSIS — R000 Tachycardia, unspecified: Secondary | ICD-10-CM | POA: Diagnosis not present

## 2023-06-19 DIAGNOSIS — L308 Other specified dermatitis: Secondary | ICD-10-CM | POA: Diagnosis not present

## 2023-06-19 DIAGNOSIS — I890 Lymphedema, not elsewhere classified: Secondary | ICD-10-CM

## 2023-06-19 DIAGNOSIS — R7889 Finding of other specified substances, not normally found in blood: Secondary | ICD-10-CM | POA: Diagnosis not present

## 2023-06-19 DIAGNOSIS — Z66 Do not resuscitate: Secondary | ICD-10-CM | POA: Diagnosis not present

## 2023-06-19 DIAGNOSIS — N189 Chronic kidney disease, unspecified: Secondary | ICD-10-CM | POA: Diagnosis not present

## 2023-06-19 DIAGNOSIS — R451 Restlessness and agitation: Secondary | ICD-10-CM | POA: Diagnosis not present

## 2023-06-19 LAB — CBC WITH DIFFERENTIAL/PLATELET
Abs Immature Granulocytes: 0.01 10*3/uL (ref 0.00–0.07)
Basophils Absolute: 0 10*3/uL (ref 0.0–0.1)
Basophils Relative: 1 %
Eosinophils Absolute: 0.2 10*3/uL (ref 0.0–0.5)
Eosinophils Relative: 3 %
HCT: 20.4 % — ABNORMAL LOW (ref 36.0–46.0)
Hemoglobin: 6.3 g/dL — ABNORMAL LOW (ref 12.0–15.0)
Immature Granulocytes: 0 %
Lymphocytes Relative: 14 %
Lymphs Abs: 0.8 10*3/uL (ref 0.7–4.0)
MCH: 29.3 pg (ref 26.0–34.0)
MCHC: 30.9 g/dL (ref 30.0–36.0)
MCV: 94.9 fL (ref 80.0–100.0)
Monocytes Absolute: 0.9 10*3/uL (ref 0.1–1.0)
Monocytes Relative: 15 %
Neutro Abs: 3.9 10*3/uL (ref 1.7–7.7)
Neutrophils Relative %: 67 %
Platelets: 358 10*3/uL (ref 150–400)
RBC: 2.15 MIL/uL — ABNORMAL LOW (ref 3.87–5.11)
RDW: 17.2 % — ABNORMAL HIGH (ref 11.5–15.5)
WBC: 5.8 10*3/uL (ref 4.0–10.5)
nRBC: 0 % (ref 0.0–0.2)

## 2023-06-19 LAB — BASIC METABOLIC PANEL
Anion gap: 9 (ref 5–15)
BUN: 21 mg/dL (ref 8–23)
CO2: 27 mmol/L (ref 22–32)
Calcium: 8.7 mg/dL — ABNORMAL LOW (ref 8.9–10.3)
Chloride: 98 mmol/L (ref 98–111)
Creatinine, Ser: 1.21 mg/dL — ABNORMAL HIGH (ref 0.44–1.00)
GFR, Estimated: 43 mL/min — ABNORMAL LOW (ref >60)
Glucose, Bld: 122 mg/dL — ABNORMAL HIGH (ref 70–99)
Potassium: 3.7 mmol/L (ref 3.5–5.1)
Sodium: 134 mmol/L — ABNORMAL LOW (ref 135–145)

## 2023-06-19 LAB — IRON AND TIBC
Iron: 231 ug/dL — ABNORMAL HIGH (ref 28–170)
Saturation Ratios: 64 % — ABNORMAL HIGH (ref 10.4–31.8)
TIBC: 364 ug/dL (ref 250–450)
UIBC: 133 ug/dL

## 2023-06-19 LAB — FERRITIN: Ferritin: 35 ng/mL (ref 11–307)

## 2023-06-19 LAB — PREPARE RBC (CROSSMATCH)

## 2023-06-19 MED ORDER — SODIUM CHLORIDE 0.9 % IV SOLN
300.0000 mg | Freq: Once | INTRAVENOUS | Status: AC
  Administered 2023-06-19: 300 mg via INTRAVENOUS
  Filled 2023-06-19: qty 300

## 2023-06-19 MED ORDER — ACETAMINOPHEN 325 MG PO TABS: 650.0000 mg | ORAL_TABLET | Freq: Four times a day (QID) | ORAL | Status: DC | PRN

## 2023-06-19 MED ORDER — ONDANSETRON HCL 4 MG PO TABS: 4.0000 mg | ORAL_TABLET | Freq: Four times a day (QID) | ORAL | Status: DC | PRN

## 2023-06-19 MED ORDER — MOMETASONE FURO-FORMOTEROL FUM 200-5 MCG/ACT IN AERO
2.0000 | INHALATION_SPRAY | Freq: Two times a day (BID) | RESPIRATORY_TRACT | Status: DC
  Administered 2023-06-19 – 2023-06-22 (×7): 2 via RESPIRATORY_TRACT
  Filled 2023-06-19: qty 8.8

## 2023-06-19 MED ORDER — METOPROLOL TARTRATE 25 MG PO TABS: 12.5000 mg | ORAL_TABLET | Freq: Two times a day (BID) | ORAL | Status: DC

## 2023-06-19 MED ORDER — PANTOPRAZOLE SODIUM 40 MG IV SOLR
40.0000 mg | Freq: Two times a day (BID) | INTRAVENOUS | Status: DC
  Administered 2023-06-19 – 2023-06-22 (×6): 40 mg via INTRAVENOUS
  Filled 2023-06-19 (×6): qty 10

## 2023-06-19 MED ORDER — ACETAMINOPHEN 650 MG RE SUPP: 650.0000 mg | Freq: Four times a day (QID) | RECTAL | Status: DC | PRN

## 2023-06-19 MED ORDER — ALUM & MAG HYDROXIDE-SIMETH 200-200-20 MG/5ML PO SUSP: 30.0000 mL | ORAL | Status: DC | PRN

## 2023-06-19 MED ORDER — ONDANSETRON HCL 4 MG/2ML IJ SOLN: 4.0000 mg | Freq: Four times a day (QID) | INTRAMUSCULAR | Status: DC | PRN

## 2023-06-19 MED ORDER — SODIUM CHLORIDE 0.9 % IV SOLN
10.0000 mL/h | Freq: Once | INTRAVENOUS | Status: AC
  Administered 2023-06-19: 10 mL/h via INTRAVENOUS

## 2023-06-19 MED ORDER — PANTOPRAZOLE SODIUM 40 MG IV SOLR
40.0000 mg | Freq: Once | INTRAVENOUS | Status: AC
  Administered 2023-06-19: 40 mg via INTRAVENOUS
  Filled 2023-06-19: qty 10

## 2023-06-19 MED ORDER — POTASSIUM CHLORIDE ER 10 MEQ PO TBCR: 10.0000 meq | EXTENDED_RELEASE_TABLET | Freq: Every day | ORAL | Status: DC

## 2023-06-19 MED ORDER — TORSEMIDE 20 MG PO TABS
20.0000 mg | ORAL_TABLET | Freq: Every day | ORAL | Status: DC
  Filled 2023-06-19: qty 1

## 2023-06-19 MED ORDER — POTASSIUM CHLORIDE CRYS ER 10 MEQ PO TBCR
10.0000 meq | EXTENDED_RELEASE_TABLET | Freq: Every day | ORAL | Status: DC
  Administered 2023-06-20 – 2023-06-22 (×3): 10 meq via ORAL
  Filled 2023-06-19 (×3): qty 1

## 2023-06-19 NOTE — Assessment & Plan Note (Signed)
Check creatinine tomorrow after blood transfusion.

## 2023-06-19 NOTE — H&P (Signed)
History and Physical    Patient: Joan Ingram YNW:[id number] DOB: April 02, 1930 DOA: 06/19/2023 DOS: the patient was seen and examined on 06/19/2023 PCP: Enid Baas, MD  Patient coming from: Assisted living  Chief Complaint:  Chief Complaint  Patient presents with   abnormal labs   HPI: Joan Ingram is a 89 y.o. female with medical history significant of paroxysmal atrial fibrillation, iron deficiency anemia essential hypertension acid reflux and asthma.  She was called by her primary care physician to come into the hospital secondary to low blood counts.  Her ferritin level was 35.  Hemoglobin 6.3.  Patient did have some black stool.  Patient takes Xarelto at home.  Patient not complaining of chest pain or shortness of breath or dizziness or lightheadedness. Review of Systems:  Review of Systems  Constitutional:  Negative for chills and fever.  HENT:  Negative for hearing loss.   Eyes:  Negative for blurred vision.  Respiratory:  Negative for cough and shortness of breath.   Cardiovascular:  Negative for chest pain.  Gastrointestinal:  Positive for melena. Negative for abdominal pain, constipation, diarrhea, nausea and vomiting.  Genitourinary:  Negative for dysuria.  Musculoskeletal:  Negative for myalgias.  Skin:  Negative for rash.  Neurological:  Negative for dizziness.  Endo/Heme/Allergies:  Bruises/bleeds easily.  Psychiatric/Behavioral:  Negative for depression.     Past Medical History:  Diagnosis Date   Asthma    Esophageal reflux    Essential hypertension    IDA (iron deficiency anemia)    Paroxysmal atrial fibrillation (HCC)    Senile osteoporosis    Past Surgical History:  Procedure Laterality Date   CARDIOVERSION N/A 04/21/2020   Procedure: CARDIOVERSION;  Surgeon: Dalia Heading, MD;  Location: [facility] ORS;  Service: Cardiovascular;  Laterality: N/A;   colonoscopy     Social History:  reports that she has never smoked. She has never used  smokeless tobacco. She reports that she does not drink alcohol and does not use drugs.  Allergies  Allergen Reactions   Other Anaphylaxis   Aspirin    Astemizole Other (See Comments)   Benadryl [Diphenhydramine Hcl]    Ciprofloxacin    Codeine    Doxycycline    Fexofenadine Other (See Comments)   Lansoprazole Diarrhea   Levofloxacin Swelling   Loratadine Other (See Comments)   Macrobid [Nitrofurantoin Macrocrystal]    Morphine And Codeine    Omeprazole Diarrhea   Oxycodone    Pantoprazole Diarrhea   Penicillins    Rabeprazole Diarrhea   Terfenadine Swelling    Family History  Problem Relation Age of Onset   CAD Mother     Prior to Admission medications   Medication Sig Start Date End Date Taking? Authorizing Provider  alum & mag hydroxide-simeth (MAALOX/MYLANTA) 200-200-20 MG/5ML suspension Take 30 mLs by mouth every 4 (four) hours as needed for indigestion or heartburn. 01/10/23  Yes Amin, Ankit C, MD  fluticasone-salmeterol (ADVAIR) 250-50 MCG/ACT AEPB Inhale 1 puff into the lungs every 12 (twelve) hours.   Yes [provider]  metoprolol tartrate (LOPRESSOR) 25 MG tablet Take 0.5 tablets (12.5 mg total) by mouth 2 (two) times daily. 04/10/23  Yes Wouk, Wilfred Curtis, MD  pantoprazole (PROTONIX) 40 MG tablet Take 1 tablet (40 mg total) by mouth 2 (two) times daily before a meal. 01/10/23  Yes Amin, Ankit C, MD  potassium chloride (KLOR-CON) 10 MEQ tablet Take 1 tablet (10 mEq total) by mouth 2 (two) times daily for 5 days. Patient  taking differently: Take 10 mEq by mouth daily. 02/28/23  Yes Dionne Bucy, MD  Rivaroxaban (XARELTO) 15 MG TABS tablet Take 1 tablet (15 mg total) by mouth daily with supper. 04/10/23  Yes Wouk, Wilfred Curtis, MD  torsemide (DEMADEX) 20 MG tablet Take 20 mg by mouth daily.   Yes [provider]    Physical Exam: Vitals:   06/19/23 0918 06/19/23 0930 06/19/23 1000 06/19/23 1224  BP: 104/72 99/67 101/63 95/60  Pulse: 89 90 91  74  Resp: 16   16  Temp: 97.7 F (36.5 C)   98.1 F (36.7 C)  TempSrc: Oral   Oral  SpO2: 97% 99% 100% 98%  Weight: 68 kg   68.8 kg  Height: 5' (1.524 m)   5' (1.524 m)   Physical Exam HENT:     Head: Normocephalic.     Mouth/Throat:     Pharynx: No oropharyngeal exudate.  Eyes:     General: Lids are normal.     Conjunctiva/sclera: Conjunctivae normal.  Cardiovascular:     Rate and Rhythm: Normal rate and regular rhythm.     Heart sounds: Normal heart sounds, S1 normal and S2 normal.  Pulmonary:     Breath sounds: No decreased breath sounds, rhonchi or rales.  Abdominal:     Palpations: Abdomen is soft.     Tenderness: There is no abdominal tenderness.  Musculoskeletal:     Right lower leg: Swelling present.     Left lower leg: Swelling present.  Skin:    General: Skin is warm.     Findings: No rash.  Neurological:     Mental Status: She is alert.     Comments: Answers some questions but not the best historian.     Data Reviewed: Hemoglobin 6.3, ferritin 35, creatinine 1.21, sodium 134  Assessment and Plan: * Acute on chronic blood loss anemia Patient's hemoglobin 6.3.  Benefits and risk of blood transfusion explained to patient and patient's daughter.  Will transfuse 1 unit of packed red blood cells.  Will also give IV iron later.  GI consulted but patient declined endoscopy currently.  Hold Xarelto for now.  Chronic diastolic CHF (congestive heart failure) (HCC) Continue torsemide  Chronic acquired lymphedema Continue torsemide  Essential hypertension On metoprolol  CKD stage 3a, GFR 45-59 ml/min (HCC) Check creatinine tomorrow after blood transfusion.  Atrial fibrillation, chronic (HCC) Continue metoprolol.  Holding Xarelto.  Memory loss Suspect dementia.  Follow-up with PCP.  Check B12, RPR and TSH.  GERD (gastroesophageal reflux disease) Switched IV Protonix      Advance Care Planning:   Code Status: Limited: Do not attempt resuscitation (DNR)  -DNR-LIMITED -Do Not Intubate/DNI    Consults: Gastroenterology  Family Communication: Spoke with daughter on the phone  Severity of Illness: The appropriate patient status for this patient is INPATIENT. Inpatient status is judged to be reasonable and necessary in order to provide the required intensity of service to ensure the patient's safety. The patient's presenting symptoms, physical exam findings, and initial radiographic and laboratory data in the context of their chronic comorbidities is felt to place them at high risk for further clinical deterioration. Furthermore, it is not anticipated that the patient will be medically stable for discharge from the hospital within 2 midnights of admission.   * I certify that at the point of admission it is my clinical judgment that the patient will require inpatient hospital care spanning beyond 2 midnights from the point of admission due to high  intensity of service, high risk for further deterioration and high frequency of surveillance required.*  Author: Alford Highland, MD 06/19/2023 2:52 PM  For on call review [email or web address].

## 2023-06-19 NOTE — Assessment & Plan Note (Signed)
Continue torsemide °

## 2023-06-19 NOTE — Consult Note (Signed)
Wyline Mood , MD [address], [place], [place], [address] [address], [place], [place], [address] Phone: [phone]  Fax: [phone]  Consultation  Referring Provider:   Dr Renae Gloss Primary Care Physician:  Enid Baas, MD Primary Gastroenterologist:  [facility]          Reason for Consultation:     Anemia   Date of Admission:  06/19/2023 Date of Consultation:  06/19/2023         HPI:   Joan Ingram is a 89 y.o. female who has previously been seen by [facility] gastroenterology back in 2022 for iron deficiency anemia.  At that point of time she declined any endoscopy procedures.   She presented to the emergency room for an incidental finding of hemoglobin of 6.4 performed at the nursing facility she states that.  She has been on Xarelto for atrial fibrillation.  History of pulmonary hypertension asthma iron deficiency anemia and dementia.  She states that she has had 1 small black bowel movement a few days back , non after, denies any hematemesis or abdominal pain or nsaid use. No other complaints. She states she wants to return to her home.   Her baseline hemoglobin has been around 8 g with an MCV of 92 but since 2 months it has dropped to 7.9 and presently 6.3.  Ferritin is at baseline around 30.  She was found to have dark stools yesterday and admitted for concerns of an upper GI bleed.   Past Medical History:  Diagnosis Date   Asthma    Esophageal reflux    Essential hypertension    IDA (iron deficiency anemia)    Paroxysmal atrial fibrillation (HCC)    Senile osteoporosis     Past Surgical History:  Procedure Laterality Date   CARDIOVERSION N/A 04/21/2020   Procedure: CARDIOVERSION;  Surgeon: Dalia Heading, MD;  Location: [facility] ORS;  Service: Cardiovascular;  Laterality: N/A;   colonoscopy      Prior to Admission medications   Medication Sig Start Date End Date Taking? Authorizing Provider  alum & mag hydroxide-simeth  (MAALOX/MYLANTA) 200-200-20 MG/5ML suspension Take 30 mLs by mouth every 4 (four) hours as needed for indigestion or heartburn. 01/10/23   Amin, Ankit C, MD  fluticasone-salmeterol (ADVAIR) 250-50 MCG/ACT AEPB Inhale 1 puff into the lungs every 12 (twelve) hours.    [provider]  metoprolol tartrate (LOPRESSOR) 25 MG tablet Take 0.5 tablets (12.5 mg total) by mouth 2 (two) times daily. 04/10/23   Wouk, Wilfred Curtis, MD  pantoprazole (PROTONIX) 40 MG tablet Take 1 tablet (40 mg total) by mouth 2 (two) times daily before a meal. 01/10/23   Amin, Ankit C, MD  potassium chloride (KLOR-CON) 10 MEQ tablet Take 1 tablet (10 mEq total) by mouth 2 (two) times daily for 5 days. Patient taking differently: Take 10 mEq by mouth daily. 02/28/23   Dionne Bucy, MD  Rivaroxaban (XARELTO) 15 MG TABS tablet Take 1 tablet (15 mg total) by mouth daily with supper. 04/10/23   Wouk, Wilfred Curtis, MD  torsemide (DEMADEX) 20 MG tablet Take 20 mg by mouth daily.    [provider]    History reviewed. No pertinent family history.   Social History   Tobacco Use   Smoking status: Never   Smokeless tobacco: Never  Substance Use Topics   Alcohol use: No    Allergies as of 06/19/2023 - Review Complete 06/19/2023  Allergen Reaction Noted  Other Anaphylaxis 01/23/2014   Aspirin  01/24/2017   Astemizole Other (See Comments) 01/23/2014   Benadryl [diphenhydramine hcl]  01/24/2017   Ciprofloxacin  01/24/2017   Codeine  01/24/2017   Doxycycline  01/24/2017   Fexofenadine Other (See Comments) 01/23/2014   Lansoprazole Diarrhea 01/23/2014   Levofloxacin Swelling 01/23/2014   Loratadine Other (See Comments) 01/23/2014   Macrobid [nitrofurantoin macrocrystal]  01/24/2017   Morphine and codeine  01/24/2017   Omeprazole Diarrhea 01/23/2014   Oxycodone  09/15/2021   Pantoprazole Diarrhea 01/23/2014   Penicillins  01/24/2017   Rabeprazole Diarrhea 01/23/2014   Terfenadine Swelling 01/23/2014     Review of Systems:    All systems reviewed and negative except where noted in HPI.   Physical Exam:  Vital signs in last 24 hours: Temp:  [97.7 F (36.5 C)-98.1 F (36.7 C)] 98.1 F (36.7 C) (10/01 1224) Pulse Rate:  [74-91] 74 (10/01 1224) Resp:  [16] 16 (10/01 1224) BP: (95-104)/(60-72) 95/60 (10/01 1224) SpO2:  [97 %-100 %] 98 % (10/01 1224) Weight:  [68 kg-68.8 kg] 68.8 kg (10/01 1224)   General:   Pleasant, cooperative in NAD Head:  Normocephalic and atraumatic. Eyes:   No icterus.   Conjunctiva pink. PERRLA. Ears:  Normal auditory acuity. Neck:  Supple; no masses or thyroidomegaly Lungs: Respirations even and unlabored. Lungs clear to auscultation bilaterally.   No wheezes, crackles, or rhonchi.  Heart:  Regular rate and rhythm;  Without murmur, clicks, rubs or gallops Abdomen:  Soft, nondistended, nontender. Normal bowel sounds. No appreciable masses or hepatomegaly.  No rebound or guarding.  Neurologic:  Alert and oriented x3;  grossly normal neurologically. Psych:  Alert and cooperative. Normal affect.  LAB RESULTS: Recent Labs    06/19/23 0959  WBC 5.8  HGB 6.3*  HCT 20.4*  PLT 358   BMET Recent Labs    06/19/23 0959  NA 134*  K 3.7  CL 98  CO2 27  GLUCOSE 122*  BUN 21  CREATININE 1.21*  CALCIUM 8.7*   LFT No results for input(s): "PROT", "ALBUMIN", "AST", "ALT", "ALKPHOS", "BILITOT", "BILIDIR", "IBILI" in the last 72 hours. PT/INR No results for input(s): "LABPROT", "INR" in the last 72 hours.  STUDIES: No results found.    Impression / Plan:   Joan Ingram is a 89 y.o. y/o female with  History of dementia, iron deficiency anemia going back for at least the past 2 years.  She had declined evaluation back in 2022 with [facility] gastroenterology.  She was sent to the emergency room for incidental finding of a hemoglobin of 6.9 g with a normal MCV.  Iron studies are at baseline ferritin on the lower side of normal 35.  TIBC is not  elevated.  There was some concern of passage of black stools.  Reviewing her labs it is more suggestive of an anemia of chronic disease.  She may also have issues with her bone marrow such as MDS.  Xarelto has been held  Plan 1.  Monitor CBC transfuse as needed 2.  IV PPI 3.  Xarelto has been held.She refuses an EGD at this time. I made her aware of the fact that if she is still bleeding we wouldn't know . She says she doesn't want to risk a procedure at her age. Advised  her to let me know if she changes her mind.   I will sign off.  Please call me if any further GI concerns or questions.  We would like to  thank you for the opportunity to participate in the care of [employer].    Thank you for involving me in the care of this patient.      LOS: 0 days   Wyline Mood, MD  06/19/2023, 12:49 PM

## 2023-06-19 NOTE — Assessment & Plan Note (Signed)
Patient's hemoglobin 6.3.  Benefits and risk of blood transfusion explained to patient and patient's daughter.  Will transfuse 1 unit of packed red blood cells.  Will also give IV iron later.  GI consulted but patient declined endoscopy currently.  Hold Xarelto for now.

## 2023-06-19 NOTE — Assessment & Plan Note (Addendum)
Suspect dementia.  Follow-up with PCP.  Check B12, RPR and TSH.

## 2023-06-19 NOTE — ED Notes (Signed)
Informed RN bed assigned 

## 2023-06-19 NOTE — ED Notes (Signed)
Chaperoned MD for rectal exam

## 2023-06-19 NOTE — Assessment & Plan Note (Signed)
Continue metoprolol.  Holding Xarelto.

## 2023-06-19 NOTE — Progress Notes (Signed)
Patient arrived to unit at 1220 pm.  Patient oriented to room and unit and provided with [facility] Patient Guide.  Patient reports no further questions.

## 2023-06-19 NOTE — ED Provider Notes (Signed)
[facility] Provider Note    Event Date/Time   First MD Initiated Contact with Patient 06/19/23 [phone]     (approximate)   History   Chief Complaint abnormal labs   HPI  Joan Ingram is a 89 y.o. female with past medical history of atrial fibrillation on Xarelto, pulmonary hypertension, CHF, asthma, iron deficiency anemia, and dementia who presents to the ED for abnormal labs.  Patient reportedly had hemoglobin of 6.4 following routine labs at her nursing facility yesterday.  She states that she feels well and denies any chest pain, shortness of breath, or lightheadedness.  She does note that she has noticed some dark black stool since yesterday, continues to take Xarelto for atrial fibrillation.  She has not had any pain in her abdomen and denies any nausea or vomiting.     Physical Exam   Triage Vital Signs: ED Triage Vitals [06/19/23 0918]  Encounter Vitals Group     BP 104/72     Systolic BP Percentile      Diastolic BP Percentile      Pulse Rate 89     Resp 16     Temp 97.7 F (36.5 C)     Temp Source Oral     SpO2 97 %     Weight 150 lb (68 kg)     Height 5' (1.524 m)     Head Circumference      Peak Flow      Pain Score 0     Pain Loc      Pain Education      Exclude from Growth Chart     Most recent vital signs: Vitals:   06/19/23 0930 06/19/23 1000  BP: 99/67 101/63  Pulse: 90 91  Resp:    Temp:    SpO2: 99% 100%    Constitutional: Alert and oriented. Eyes: Conjunctivae are normal. Head: Atraumatic. Nose: No congestion/rhinnorhea. Mouth/Throat: Mucous membranes are moist.  Cardiovascular: Normal rate, regular rhythm. Grossly normal heart sounds.  2+ radial pulses bilaterally. Respiratory: Normal respiratory effort.  No retractions. Lungs CTAB. Gastrointestinal: Soft and nontender. No distention.  Rectal exam with dark black guaiac positive stool. Musculoskeletal: No lower extremity tenderness nor edema.  Neurologic:   Normal speech and language. No gross focal neurologic deficits are appreciated.    ED Results / Procedures / Treatments   Labs (all labs ordered are listed, but only abnormal results are displayed) Labs Reviewed  CBC WITH DIFFERENTIAL/PLATELET - Abnormal; Notable for the following components:      Result Value   RBC 2.15 (*)    Hemoglobin 6.3 (*)    HCT 20.4 (*)    RDW 17.2 (*)    All other components within normal limits  BASIC METABOLIC PANEL - Abnormal; Notable for the following components:   Sodium 134 (*)    Glucose, Bld 122 (*)    Creatinine, Ser 1.21 (*)    Calcium 8.7 (*)    GFR, Estimated 43 (*)    All other components within normal limits  IRON AND TIBC  FERRITIN  TYPE AND SCREEN  PREPARE RBC (CROSSMATCH)  TYPE AND SCREEN     PROCEDURES:  Critical Care performed: Yes, see critical care procedure note(s)  .Critical Care  Performed by: Chesley Noon, MD Authorized by: Chesley Noon, MD   Critical care provider statement:    Critical care time (minutes):  30   Critical care time was exclusive of:  Separately billable procedures and  treating other patients and teaching time   Critical care was necessary to treat or prevent imminent or life-threatening deterioration of the following conditions: Anemia.   Critical care was time spent personally by me on the following activities:  Development of treatment plan with patient or surrogate, discussions with consultants, evaluation of patient's response to treatment, examination of patient, ordering and review of laboratory studies, ordering and review of radiographic studies, ordering and performing treatments and interventions, pulse oximetry, re-evaluation of patient's condition and review of old charts   I assumed direction of critical care for this patient from another provider in my specialty: no     Care discussed with: admitting provider      MEDICATIONS ORDERED IN ED: Medications  pantoprazole (PROTONIX)  injection 40 mg (has no administration in time range)  0.9 %  sodium chloride infusion (has no administration in time range)     IMPRESSION / MDM / ASSESSMENT AND PLAN / ED COURSE  I reviewed the triage vital signs and the nursing notes.                              89 y.o. female with past medical history of atrial fibrillation on Xarelto, pulmonary hypertension, CHF, asthma, iron deficiency anemia, and dementia who presents to the ED for low hemoglobin noted on outpatient labs.  Patient's presentation is most consistent with acute presentation with potential threat to life or bodily function.  Differential diagnosis includes, but is not limited to, iron deficiency anemia, lower GI bleed, upper GI bleed, anemia of chronic disease.  Patient nontoxic-appearing and in no acute distress, vital signs are unremarkable.  She has a benign abdominal exam, but rectal exam does show dark stool that is guaiac positive after patient noted dark stools since yesterday.  Labs from yesterday do show hemoglobin of 6.4, no microcytosis noted and I have lower suspicion this is due to iron deficiency anemia.  We will recheck labs including iron and ferritin levels, anticipate transfusion and admission to the hospital.  Given concern for upper GI bleed we will give dose of IV Protonix.  Labs today show hemoglobin of 6.3, again with no microcytosis.  No significant leukocytosis, showed abnormality, or AKI noted.  Case discussed with hospitalist for admission.      FINAL CLINICAL IMPRESSION(S) / ED DIAGNOSES   Final diagnoses:  Gastrointestinal hemorrhage, unspecified gastrointestinal hemorrhage type  Anemia, unspecified type     Rx / DC Orders   ED Discharge Orders     None        Note:  This document was prepared using Dragon voice recognition software and may include unintentional dictation errors.   Chesley Noon, MD 06/19/23 [phone]

## 2023-06-19 NOTE — Assessment & Plan Note (Signed)
On metoprolol

## 2023-06-19 NOTE — ED Triage Notes (Signed)
Pt brought from [facility], pt had blood work performed yesterday and found that her hgb was 6.4, pt denies feeling unwell, states that she was sitting minding her own business and told she had to go to the ER, pt does take xarelto for her a.fib

## 2023-06-19 NOTE — Assessment & Plan Note (Signed)
Switched IV Protonix

## 2023-06-20 DIAGNOSIS — D62 Acute posthemorrhagic anemia: Secondary | ICD-10-CM | POA: Diagnosis not present

## 2023-06-20 LAB — CBC
HCT: 20.4 % — ABNORMAL LOW (ref 36.0–46.0)
Hemoglobin: 6.3 g/dL — ABNORMAL LOW (ref 12.0–15.0)
MCH: 29.9 pg (ref 26.0–34.0)
MCHC: 30.9 g/dL (ref 30.0–36.0)
MCV: 96.7 fL (ref 80.0–100.0)
Platelets: 305 10*3/uL (ref 150–400)
RBC: 2.11 MIL/uL — ABNORMAL LOW (ref 3.87–5.11)
RDW: 17.2 % — ABNORMAL HIGH (ref 11.5–15.5)
WBC: 4.5 10*3/uL (ref 4.0–10.5)
nRBC: 0 % (ref 0.0–0.2)

## 2023-06-20 LAB — BASIC METABOLIC PANEL
Anion gap: 6 (ref 5–15)
BUN: 22 mg/dL (ref 8–23)
CO2: 28 mmol/L (ref 22–32)
Calcium: 8.4 mg/dL — ABNORMAL LOW (ref 8.9–10.3)
Chloride: 102 mmol/L (ref 98–111)
Creatinine, Ser: 1.21 mg/dL — ABNORMAL HIGH (ref 0.44–1.00)
GFR, Estimated: 43 mL/min — ABNORMAL LOW (ref >60)
Glucose, Bld: 93 mg/dL (ref 70–99)
Potassium: 3.8 mmol/L (ref 3.5–5.1)
Sodium: 136 mmol/L (ref 135–145)

## 2023-06-20 LAB — PREPARE RBC (CROSSMATCH)

## 2023-06-20 LAB — VITAMIN B12: Vitamin B-12: 308 pg/mL (ref 180–914)

## 2023-06-20 LAB — HEMOGLOBIN AND HEMATOCRIT, BLOOD
HCT: 22.5 % — ABNORMAL LOW (ref 36.0–46.0)
Hemoglobin: 7.2 g/dL — ABNORMAL LOW (ref 12.0–15.0)

## 2023-06-20 LAB — TSH: TSH: 4.592 u[IU]/mL — ABNORMAL HIGH (ref 0.350–4.500)

## 2023-06-20 LAB — RPR: RPR Ser Ql: NONREACTIVE

## 2023-06-20 MED ORDER — SODIUM CHLORIDE 0.9 % IV BOLUS
250.0000 mL | Freq: Once | INTRAVENOUS | Status: AC
  Administered 2023-06-20: 250 mL via INTRAVENOUS

## 2023-06-20 MED ORDER — SODIUM CHLORIDE 0.9% IV SOLUTION: Freq: Once | INTRAVENOUS | Status: AC

## 2023-06-20 NOTE — Progress Notes (Signed)
PROGRESS NOTE    Joan Ingram   YQM:[id number] DOB: [birth year]  DOA: 06/19/2023 Date of Service: 06/20/23 which is hospital day 1  PCP: Enid Baas, MD    HPI:  Joan Ingram is a 89 y.o. female with medical history significant of paroxysmal atrial fibrillation, iron deficiency anemia essential hypertension acid reflux and asthma.  She was called by her primary care physician to come into the hospital secondary to low blood counts.  Her ferritin level was 35.  Hemoglobin 6.3.  Patient did have some black stool.  Patient takes Xarelto at home.  Patient not complaining of chest pain or shortness of breath or dizziness or lightheadedness. She states that she has had 1 small black bowel movement a few days back , non after, denies any hematemesis or abdominal pain or nsaid use. No other complaints.   Hospital course / significant events:  10/01: to ED from assisted living.1 unit PRBC transfused. GI saw pt, she declines endoscopy, GI has s/o.  10/02: Hgb still 6.3, pending 2nd unit PRBC, plan monitor this evening/overnight   Consultants:  Gastroenterology   Procedures/Surgeries: none      ASSESSMENT & PLAN:   Acute on chronic blood loss anemia Concern for GI bleed - melena and FOBT(+) in ED  Patient's hemoglobin 6.3 on admission. S/p 1 unit of packed red blood cells Pending 2nd unit PRBC today  GI consulted but patient declined endoscopy currently. I revisited this discussion again today, pt reported no scope desired. We discussed if she starts to bleed profusely we may need to revisit this conversation and if she still doesn't want scope at that time, would transition to comfort measures only - will respect informed refusal of scope, but if cannot stop the bleeding at its source will not give continuous massive transfusion PRBC to no benefit, patient given time to process but seems agreeable to this, states "if it's my time, it's my time" Hold Xarelto for now.    Chronic diastolic CHF (congestive heart failure) (HCC) Not in exacerbation  Continue torsemide - hold for low BP   Chronic acquired lymphedema Continue torsemide - hold for low BP   Essential hypertension On metoprolol - hold for low BP   CKD stage 3a, GFR 45-59 ml/min (HCC) Check creatinine tomorrow after blood transfusion.   Atrial fibrillation, chronic (HCC) Continue metoprolol - low dose, caution w/ low BP Holding Xarelto.   Memory loss Suspect dementia.   Follow-up with PCP.   Check B12, RPR and TSH.   GERD (gastroesophageal reflux disease) IV Protonix    No concerns based on BMI: Body mass index is 29.62 kg/m.   DVT prophylaxis: SCD IV fluids: no continuous IV fluids  Nutrition: regular Central lines / invasive devices: none  Code Status: DNR ACP documentation reviewed: 06/20/23 and has MOST on file in [facility]  TOC needs: TBD but expect back to ALF Barriers to dispo / significant pending items: pending PRBC transfusion and monitor CBC in AM              Subjective / Brief ROS:  Patient reports no cocners Not dizzy or feeling weak Denies CP/SOB.  Pain controlled.  Denies new weakness.  Tolerating diet.  Reports no concerns w/ urination/defecation.   Family Communication: none at this time - pt declined call to family/support person(s) for now     Objective Findings:  Vitals:   06/20/23 0835 06/20/23 0957 06/20/23 1208 06/20/23 1242  BP: 91/63 97/62 (!) 96/53 102/63  Pulse: (!) 109 78 86 81  Resp: 19   19  Temp: (!) 97.5 F (36.4 C)  98.1 F (36.7 C) 97.9 F (36.6 C)  TempSrc:   Oral Oral  SpO2: 95%  95%   Weight:      Height:        Intake/Output Summary (Last 24 hours) at 06/20/2023 1424 Last data filed at 06/19/2023 2109 Gross per 24 hour  Intake 480 ml  Output --  Net 480 ml   Filed Weights   06/19/23 0918 06/19/23 1224  Weight: 68 kg 68.8 kg    Examination:  Physical Exam Constitutional:      General: She is not  in acute distress.    Appearance: She is not ill-appearing.  Cardiovascular:     Rate and Rhythm: Normal rate. Rhythm irregular.  Pulmonary:     Effort: Pulmonary effort is normal.     Breath sounds: Normal breath sounds.  Abdominal:     General: Abdomen is flat.     Palpations: Abdomen is soft.  Skin:    General: Skin is warm and dry.  Neurological:     General: No focal deficit present.     Mental Status: She is alert and oriented to person, place, and time.  Psychiatric:        Mood and Affect: Mood normal.        Behavior: Behavior normal.        Thought Content: Thought content normal.        Judgment: Judgment normal.          Scheduled Medications:   sodium chloride   Intravenous Once   metoprolol tartrate  12.5 mg Oral BID   mometasone-formoterol  2 puff Inhalation BID   pantoprazole (PROTONIX) IV  40 mg Intravenous Q12H   potassium chloride  10 mEq Oral Daily   torsemide  20 mg Oral Daily    Continuous Infusions:   PRN Medications:  acetaminophen **OR** acetaminophen, alum & mag hydroxide-simeth, ondansetron **OR** ondansetron (ZOFRAN) IV  Antimicrobials from admission:  Anti-infectives (From admission, onward)    None           Data Reviewed:  I have personally reviewed the following...  CBC: Recent Labs  Lab 06/19/23 0959 06/20/23 0449  WBC 5.8 4.5  NEUTROABS 3.9  --   HGB 6.3* 6.3*  HCT 20.4* 20.4*  MCV 94.9 96.7  PLT 358 305   Basic Metabolic Panel: Recent Labs  Lab 06/19/23 0959 06/20/23 0449  NA 134* 136  K 3.7 3.8  CL 98 102  CO2 27 28  GLUCOSE 122* 93  BUN 21 22  CREATININE 1.21* 1.21*  CALCIUM 8.7* 8.4*   GFR: Estimated Creatinine Clearance: 26.7 mL/min (A) (by C-G formula based on SCr of 1.21 mg/dL (H)). Liver Function Tests: No results for input(s): "AST", "ALT", "ALKPHOS", "BILITOT", "PROT", "ALBUMIN" in the last 168 hours. No results for input(s): "LIPASE", "AMYLASE" in the last 168 hours. No results for  input(s): "AMMONIA" in the last 168 hours. Coagulation Profile: No results for input(s): "INR", "PROTIME" in the last 168 hours. Cardiac Enzymes: No results for input(s): "CKTOTAL", "CKMB", "CKMBINDEX", "TROPONINI" in the last 168 hours. BNP (last 3 results) No results for input(s): "PROBNP" in the last 8760 hours. HbA1C: No results for input(s): "HGBA1C" in the last 72 hours. CBG: No results for input(s): "GLUCAP" in the last 168 hours. Lipid Profile: No results for input(s): "CHOL", "HDL", "LDLCALC", "TRIG", "CHOLHDL", "LDLDIRECT" in the last  72 hours. Thyroid Function Tests: Recent Labs    06/20/23 0449  TSH 4.592*   Anemia Panel: Recent Labs    06/19/23 0959 06/20/23 0449  VITAMINB12  --  308  FERRITIN 35  --   TIBC 364  --   IRON 231*  --    Most Recent Urinalysis On File:     Component Value Date/Time   COLORURINE YELLOW (A) 04/04/2023 1411   APPEARANCEUR CLEAR (A) 04/04/2023 1411   APPEARANCEUR Hazy 11/16/2013 1150   LABSPEC 1.006 04/04/2023 1411   LABSPEC 1.012 11/16/2013 1150   PHURINE 7.0 04/04/2023 1411   GLUCOSEU NEGATIVE 04/04/2023 1411   GLUCOSEU Negative 11/16/2013 1150   HGBUR SMALL (A) 04/04/2023 1411   BILIRUBINUR NEGATIVE 04/04/2023 1411   BILIRUBINUR Negative 11/16/2013 1150   KETONESUR NEGATIVE 04/04/2023 1411   PROTEINUR NEGATIVE 04/04/2023 1411   NITRITE NEGATIVE 04/04/2023 1411   LEUKOCYTESUR MODERATE (A) 04/04/2023 1411   LEUKOCYTESUR 3+ 11/16/2013 1150   Sepsis Labs: @LABRCNTIP (procalcitonin:4,lacticidven:4) Microbiology: No results found for this or any previous visit (from the past 240 hour(s)).    Radiology Studies last 3 days: No results found.      Sunnie Nielsen, DO [facility] 06/20/2023, 2:24 PM    Dictation software may have been used to generate the above note. Typos may occur and escape review in typed/dictated notes. Please contact Dr Lyn Hollingshead directly for clarity if needed.  Staff may message me  via secure chat in Epic  but this may not receive an immediate response,  please page me for urgent matters!  If 7PM-7AM, please contact night coverage [email or web address]

## 2023-06-20 NOTE — Plan of Care (Signed)

## 2023-06-20 NOTE — Hospital Course (Addendum)
HPI:  Joan Ingram is a 89 y.o. female with medical history significant of paroxysmal atrial fibrillation, iron deficiency anemia essential hypertension acid reflux and asthma.  She was called by her primary care physician to come into the hospital secondary to low blood counts.  Her ferritin level was 35.  Hemoglobin 6.3.  Patient did have some black stool.  Patient takes Xarelto at home.  Patient not complaining of chest pain or shortness of breath or dizziness or lightheadedness. She states that she has had 1 small black bowel movement a few days back , non after, denies any hematemesis or abdominal pain or nsaid use. No other complaints.   Hospital course / significant events:  10/01: to ED from assisted living.1 unit PRBC transfused. GI saw pt, she declines endoscopy, GI has s/o.  10/02: Hgb still 6.3, pending 2nd unit PRBC 10/03: Hgb 7.1, given likely slow bleed will given another 1 unit PRBC today for goal 8+ and if stable can anticipate d/c tomorrow AM 10/04: *** ambulation, tolerating diet, stable for discharge   Consultants:  Gastroenterology   Procedures/Surgeries: none      ASSESSMENT & PLAN:   Acute on chronic blood loss anemia Concern for GI bleed - melena and FOBT(+) in ED  Patient's hemoglobin 6.3 on admission. S/p 1 unit of packed red blood cells Pending 3rd unit PRBC today  GI consulted but patient declined endoscopy. I revisited this discussion again 10/02, pt reported no scope desired. We discussed if she starts to bleed profusely we may need to revisit this conversation and if she still doesn't want scope at that time, would transition to comfort measures only - will respect informed refusal of scope, but if cannot stop the bleeding at its source will not give massive transfusion PRBC to no benefit, patient given time to process but seems agreeable to this, states "if it's my time, it's my time" Hold Xarelto    Chronic diastolic CHF (congestive heart failure)  (HCC) Not in exacerbation  changing torsemide from daily to prn - hold for low BP Holding beta blocker and other GDMT d/t low BP  Follow outpatient w/ PCP/cardiology    Chronic acquired lymphedema changing torsemide from daily to prn - hold for low BP   Essential hypertension Hypotensive here concern for low blood volume holding metoprolol d/t low BP, restart outpatient as appropriate  Started midodrine here    CKD stage 3a, GFR 45-59 ml/min (HCC) Monitor BMP outpatient    Atrial fibrillation, chronic (HCC) Holding metoprolol d/t low BP, rate has been appropriate Holding Xarelto.d/t likely GI bleed   Memory loss Suspect dementia.   Follow-up with PCP.   Checked B12, RPR and TSH.   GERD (gastroesophageal reflux disease) IV Protonix    No concerns based on BMI: Body mass index is 29.62 kg/m.   DVT prophylaxis: SCD IV fluids: no continuous IV fluids  Nutrition: regular Central lines / invasive devices: none  Code Status: DNR ACP documentation reviewed: 06/20/23 and has MOST on file in [facility]  TOC needs: TBD but expect back to ALF Barriers to dispo / significant pending items: pending PRBC transfusion and monitor CBC in AM

## 2023-06-20 NOTE — Plan of Care (Signed)
Pt alert and oriented, denies any c/o pain. Pt admitted for acute chronic blood loss with HGB 6.3. Pt received 2 unit of PRBC's today HGB 7.2. Pt gets up with one person stand by with FWW. Pt from [facility].  Problem: Education: Goal: Knowledge of General Education information will improve Description: Including pain rating scale, medication(s)/side effects and non-pharmacologic comfort measures Outcome: Progressing   Problem: Health Behavior/Discharge Planning: Goal: Ability to manage health-related needs will improve Outcome: Progressing   Problem: Clinical Measurements: Goal: Ability to maintain clinical measurements within normal limits will improve Outcome: Progressing Goal: Will remain free from infection Outcome: Progressing Goal: Diagnostic test results will improve Outcome: Progressing Goal: Respiratory complications will improve Outcome: Progressing Goal: Cardiovascular complication will be avoided Outcome: Progressing   Problem: Activity: Goal: Risk for activity intolerance will decrease Outcome: Progressing   Problem: Nutrition: Goal: Adequate nutrition will be maintained Outcome: Progressing   Problem: Coping: Goal: Level of anxiety will decrease Outcome: Progressing   Problem: Elimination: Goal: Will not experience complications related to bowel motility Outcome: Progressing Goal: Will not experience complications related to urinary retention Outcome: Progressing   Problem: Pain Managment: Goal: General experience of comfort will improve Outcome: Progressing   Problem: Safety: Goal: Ability to remain free from injury will improve Outcome: Progressing   Problem: Skin Integrity: Goal: Risk for impaired skin integrity will decrease Outcome: Progressing

## 2023-06-21 DIAGNOSIS — D62 Acute posthemorrhagic anemia: Secondary | ICD-10-CM | POA: Diagnosis not present

## 2023-06-21 LAB — BASIC METABOLIC PANEL
Anion gap: 7 (ref 5–15)
BUN: 18 mg/dL (ref 8–23)
CO2: 27 mmol/L (ref 22–32)
Calcium: 8.5 mg/dL — ABNORMAL LOW (ref 8.9–10.3)
Chloride: 104 mmol/L (ref 98–111)
Creatinine, Ser: 1.01 mg/dL — ABNORMAL HIGH (ref 0.44–1.00)
GFR, Estimated: 53 mL/min — ABNORMAL LOW (ref >60)
Glucose, Bld: 106 mg/dL — ABNORMAL HIGH (ref 70–99)
Potassium: 3.8 mmol/L (ref 3.5–5.1)
Sodium: 138 mmol/L (ref 135–145)

## 2023-06-21 LAB — HEMOGLOBIN AND HEMATOCRIT, BLOOD
HCT: 25.5 % — ABNORMAL LOW (ref 36.0–46.0)
Hemoglobin: 8.2 g/dL — ABNORMAL LOW (ref 12.0–15.0)

## 2023-06-21 LAB — CBC
HCT: 21.9 % — ABNORMAL LOW (ref 36.0–46.0)
Hemoglobin: 7.1 g/dL — ABNORMAL LOW (ref 12.0–15.0)
MCH: 30.1 pg (ref 26.0–34.0)
MCHC: 32.4 g/dL (ref 30.0–36.0)
MCV: 92.8 fL (ref 80.0–100.0)
Platelets: 278 10*3/uL (ref 150–400)
RBC: 2.36 MIL/uL — ABNORMAL LOW (ref 3.87–5.11)
RDW: 17.3 % — ABNORMAL HIGH (ref 11.5–15.5)
WBC: 5 10*3/uL (ref 4.0–10.5)
nRBC: 0.4 % — ABNORMAL HIGH (ref 0.0–0.2)

## 2023-06-21 LAB — PREPARE RBC (CROSSMATCH)

## 2023-06-21 MED ORDER — MIDODRINE HCL 5 MG PO TABS
10.0000 mg | ORAL_TABLET | Freq: Three times a day (TID) | ORAL | Status: DC
  Administered 2023-06-21 – 2023-06-22 (×6): 10 mg via ORAL
  Filled 2023-06-21 (×6): qty 2

## 2023-06-21 MED ORDER — SODIUM CHLORIDE 0.9% IV SOLUTION: Freq: Once | INTRAVENOUS | Status: AC

## 2023-06-21 MED ORDER — SODIUM CHLORIDE 0.9 % IV BOLUS
250.0000 mL | Freq: Once | INTRAVENOUS | Status: AC
  Administered 2023-06-21: 250 mL via INTRAVENOUS

## 2023-06-21 MED ORDER — TORSEMIDE 20 MG PO TABS
10.0000 mg | ORAL_TABLET | Freq: Every day | ORAL | Status: DC
  Administered 2023-06-22: 10 mg via ORAL
  Filled 2023-06-21: qty 1

## 2023-06-21 NOTE — Progress Notes (Signed)
Md notified of patient bp 98/64 map 96. Md notified on 06/20/23@826 . Orders to hold scheduled metoprolol and administer 250bolus of NS. Bolus tolerated. BP rechecked at 4am. BP 90/55 map of 76. Order for additional bolus of 250 ns, new order for midodrine. 1st dose at 515am, then tid with meals for hypotension. Pt remains asymptomatic. No distress. She has been made aware of the changes in meds with no concerns voiced

## 2023-06-21 NOTE — Plan of Care (Addendum)
Pt is alert and oriented X 4. One unit of RBC transfusion done. No any sign and symptoms of reaction seen. Pt tolerated well. Problem: Education: Goal: Knowledge of General Education information will improve Description: Including pain rating scale, medication(s)/side effects and non-pharmacologic comfort measures Outcome: Progressing   Problem: Health Behavior/Discharge Planning: Goal: Ability to manage health-related needs will improve Outcome: Progressing   Problem: Clinical Measurements: Goal: Ability to maintain clinical measurements within normal limits will improve Outcome: Progressing Goal: Will remain free from infection Outcome: Progressing Goal: Diagnostic test results will improve Outcome: Progressing Goal: Respiratory complications will improve Outcome: Progressing Goal: Cardiovascular complication will be avoided Outcome: Progressing   Problem: Activity: Goal: Risk for activity intolerance will decrease Outcome: Progressing   Problem: Nutrition: Goal: Adequate nutrition will be maintained Outcome: Progressing   Problem: Coping: Goal: Level of anxiety will decrease Outcome: Progressing   Problem: Elimination: Goal: Will not experience complications related to bowel motility Outcome: Progressing Goal: Will not experience complications related to urinary retention Outcome: Progressing   Problem: Pain Managment: Goal: General experience of comfort will improve Outcome: Progressing   Problem: Safety: Goal: Ability to remain free from injury will improve Outcome: Progressing   Problem: Skin Integrity: Goal: Risk for impaired skin integrity will decrease Outcome: Progressing

## 2023-06-21 NOTE — Progress Notes (Signed)
PROGRESS NOTE    LAURENE BARTLES   UJW:[id number] DOB: Jul 22, 1930  DOA: 06/19/2023 Date of Service: 06/21/23 which is hospital day 2  PCP: Enid Baas, MD    HPI:  Joan Ingram is a 89 y.o. female with medical history significant of paroxysmal atrial fibrillation, iron deficiency anemia essential hypertension acid reflux and asthma.  She was called by her primary care physician to come into the hospital secondary to low blood counts.  Her ferritin level was 35.  Hemoglobin 6.3.  Patient did have some black stool.  Patient takes Xarelto at home.  Patient not complaining of chest pain or shortness of breath or dizziness or lightheadedness. She states that she has had 1 small black bowel movement a few days back , non after, denies any hematemesis or abdominal pain or nsaid use. No other complaints.   Hospital course / significant events:  10/01: to ED from assisted living.1 unit PRBC transfused. GI saw pt, she declines endoscopy, GI has s/o.  10/02: Hgb still 6.3, pending 2nd unit PRBC 10/03: Hgb 7.1, given likely slow bleed will given another 1 unit PRBC today for goal 8+ and if stable can anticipate d/c tomorrow AM  Consultants:  Gastroenterology   Procedures/Surgeries: none      ASSESSMENT & PLAN:   Acute on chronic blood loss anemia Concern for GI bleed - melena and FOBT(+) in ED  Patient's hemoglobin 6.3 on admission. S/p 1 unit of packed red blood cells Pending 3rd unit PRBC today  GI consulted but patient declined endoscopy. I revisited this discussion again 10/02, pt reported no scope desired. We discussed if she starts to bleed profusely we may need to revisit this conversation and if she still doesn't want scope at that time, would transition to comfort measures only - will respect informed refusal of scope, but if cannot stop the bleeding at its source will not give massive transfusion PRBC to no benefit, patient given time to process but seems agreeable to  this, states "if it's my time, it's my time" Hold Xarelto for now.   Chronic diastolic CHF (congestive heart failure) (HCC) Not in exacerbation  Continue torsemide - hold for low BP   Chronic acquired lymphedema Continue torsemide - hold for low BP   Essential hypertension On metoprolol - hold for low BP   CKD stage 3a, GFR 45-59 ml/min (HCC) Check creatinine tomorrow after blood transfusion.   Atrial fibrillation, chronic (HCC) Continue metoprolol - low dose, caution w/ low BP Holding Xarelto.   Memory loss Suspect dementia.   Follow-up with PCP.   Check B12, RPR and TSH.   GERD (gastroesophageal reflux disease) IV Protonix    No concerns based on BMI: Body mass index is 29.62 kg/m.   DVT prophylaxis: SCD IV fluids: no continuous IV fluids  Nutrition: regular Central lines / invasive devices: none  Code Status: DNR ACP documentation reviewed: 06/20/23 and has MOST on file in [facility]  TOC needs: TBD but expect back to ALF Barriers to dispo / significant pending items: pending PRBC transfusion and monitor CBC in AM              Subjective / Brief ROS:  Patient reports no concerns Not dizzy or feeling weak but not walking very far  Denies CP/SOB.  Pain controlled.  Denies new weakness.  Tolerating diet.  Reports no concerns w/ urination/defecation.   Family Communication: none at this time - pt declined call to family/support person(s) for now  Objective Findings:  Vitals:   06/21/23 0700 06/21/23 1153 06/21/23 1434 06/21/23 1510  BP: 92/66 100/60 (!) 103/55 105/60  Pulse: 88  72 68  Resp:   18 18  Temp: 97.8 F (36.6 C)  98 F (36.7 C) 98 F (36.7 C)  TempSrc: Oral   Oral  SpO2: 98%  97% 98%  Weight:      Height:        Intake/Output Summary (Last 24 hours) at 06/21/2023 1538 Last data filed at 06/21/2023 0900 Gross per 24 hour  Intake 240 ml  Output --  Net 240 ml   Filed Weights   06/19/23 0918 06/19/23 1224  Weight: 68 kg  68.8 kg    Examination:  Physical Exam Constitutional:      General: She is not in acute distress.    Appearance: She is not ill-appearing.  Cardiovascular:     Rate and Rhythm: Normal rate. Rhythm irregular.  Pulmonary:     Effort: Pulmonary effort is normal.     Breath sounds: Normal breath sounds.  Abdominal:     General: Abdomen is flat.     Palpations: Abdomen is soft.  Skin:    General: Skin is warm and dry.  Neurological:     General: No focal deficit present.     Mental Status: She is alert and oriented to person, place, and time.  Psychiatric:        Mood and Affect: Mood normal.        Behavior: Behavior normal.        Thought Content: Thought content normal.        Judgment: Judgment normal.          Scheduled Medications:   metoprolol tartrate  12.5 mg Oral BID   midodrine  10 mg Oral TID WC   mometasone-formoterol  2 puff Inhalation BID   pantoprazole (PROTONIX) IV  40 mg Intravenous Q12H   potassium chloride  10 mEq Oral Daily   torsemide  10 mg Oral Daily    Continuous Infusions:   PRN Medications:  acetaminophen **OR** acetaminophen, alum & mag hydroxide-simeth, ondansetron **OR** ondansetron (ZOFRAN) IV  Antimicrobials from admission:  Anti-infectives (From admission, onward)    None           Data Reviewed:  I have personally reviewed the following...  CBC: Recent Labs  Lab 06/19/23 0959 06/20/23 0449 06/20/23 1558 06/21/23 0454  WBC 5.8 4.5  --  5.0  NEUTROABS 3.9  --   --   --   HGB 6.3* 6.3* 7.2* 7.1*  HCT 20.4* 20.4* 22.5* 21.9*  MCV 94.9 96.7  --  92.8  PLT 358 305  --  278   Basic Metabolic Panel: Recent Labs  Lab 06/19/23 0959 06/20/23 0449 06/21/23 0454  NA 134* 136 138  K 3.7 3.8 3.8  CL 98 102 104  CO2 27 28 27   GLUCOSE 122* 93 106*  BUN 21 22 18   CREATININE 1.21* 1.21* 1.01*  CALCIUM 8.7* 8.4* 8.5*   GFR: Estimated Creatinine Clearance: 32 mL/min (A) (by C-G formula based on SCr of 1.01 mg/dL  (H)). Liver Function Tests: No results for input(s): "AST", "ALT", "ALKPHOS", "BILITOT", "PROT", "ALBUMIN" in the last 168 hours. No results for input(s): "LIPASE", "AMYLASE" in the last 168 hours. No results for input(s): "AMMONIA" in the last 168 hours. Coagulation Profile: No results for input(s): "INR", "PROTIME" in the last 168 hours. Cardiac Enzymes: No results for input(s): "CKTOTAL", "CKMB", "CKMBINDEX", "  TROPONINI" in the last 168 hours. BNP (last 3 results) No results for input(s): "PROBNP" in the last 8760 hours. HbA1C: No results for input(s): "HGBA1C" in the last 72 hours. CBG: No results for input(s): "GLUCAP" in the last 168 hours. Lipid Profile: No results for input(s): "CHOL", "HDL", "LDLCALC", "TRIG", "CHOLHDL", "LDLDIRECT" in the last 72 hours. Thyroid Function Tests: Recent Labs    06/20/23 0449  TSH 4.592*   Anemia Panel: Recent Labs    06/19/23 0959 06/20/23 0449  VITAMINB12  --  308  FERRITIN 35  --   TIBC 364  --   IRON 231*  --    Most Recent Urinalysis On File:     Component Value Date/Time   COLORURINE YELLOW (A) 04/04/2023 1411   APPEARANCEUR CLEAR (A) 04/04/2023 1411   APPEARANCEUR Hazy 11/16/2013 1150   LABSPEC 1.006 04/04/2023 1411   LABSPEC 1.012 11/16/2013 1150   PHURINE 7.0 04/04/2023 1411   GLUCOSEU NEGATIVE 04/04/2023 1411   GLUCOSEU Negative 11/16/2013 1150   HGBUR SMALL (A) 04/04/2023 1411   BILIRUBINUR NEGATIVE 04/04/2023 1411   BILIRUBINUR Negative 11/16/2013 1150   KETONESUR NEGATIVE 04/04/2023 1411   PROTEINUR NEGATIVE 04/04/2023 1411   NITRITE NEGATIVE 04/04/2023 1411   LEUKOCYTESUR MODERATE (A) 04/04/2023 1411   LEUKOCYTESUR 3+ 11/16/2013 1150   Sepsis Labs: @LABRCNTIP (procalcitonin:4,lacticidven:4) Microbiology: No results found for this or any previous visit (from the past 240 hour(s)).    Radiology Studies last 3 days: No results found.      Sunnie Nielsen, DO [facility] 06/21/2023, 3:38 PM     Dictation software may have been used to generate the above note. Typos may occur and escape review in typed/dictated notes. Please contact Dr Lyn Hollingshead directly for clarity if needed.  Staff may message me via secure chat in Epic  but this may not receive an immediate response,  please page me for urgent matters!  If 7PM-7AM, please contact night coverage [email or web address]

## 2023-06-22 DIAGNOSIS — D62 Acute posthemorrhagic anemia: Secondary | ICD-10-CM | POA: Diagnosis not present

## 2023-06-22 MED ORDER — TORSEMIDE 10 MG PO TABS: 10.0000 mg | ORAL_TABLET | Freq: Every day | ORAL | 0 refills | Status: AC | PRN

## 2023-06-22 MED ORDER — MIDODRINE HCL 10 MG PO TABS: 5.0000 mg | ORAL_TABLET | Freq: Three times a day (TID) | ORAL | 0 refills | Status: AC

## 2023-06-22 NOTE — TOC Transition Note (Addendum)
Transition of Care [facility]) - CM/SW Discharge Note   Patient Details  Name: Joan Ingram MRN: [id number] Date of Birth: 02-08-1930  Transition of Care [facility]) CM/SW Contact:  Garret Reddish, RN Phone Number: 06/22/2023, 2:06 PM   Clinical Narrative:    Chart reviewed.  Noted that patient has orders for discharge today.  Noted that patient is a resident of [facility] facility.    I have spoken with Karel Jarvis, Resident Coordinator at [facility] she informs me that patient is able to return back to the facility today.  I have informed Karel Jarvis that patient will resume home health services with [facility].  I have faxed [facility] patient's fl 2 and Discharge Summary to [phone].  Her contact number is [phone]. Ebony reports that she will pick patient up today at 2 pm in front of the medical mall.  Karel Jarvis reports that number to call report is [phone].  I have informed Morrie Sheldon with [facility] that patient will need PT and OT at the facility.  Patient was active with [facility] at [facility].    I have left a voice mail for patient's family member Doris Cheadle that patient would be returning to the facility today. I have informed staff of the above information.    I have  Final next level of care: Assisted Living Barriers to Discharge: No Barriers Identified   Patient Goals and CMS Choice CMS Medicare.gov Compare Post Acute Care list provided to:: Patient Choice offered to / list presented to : Patient  Discharge Placement                Patient chooses bed at:  ([facility] facility) Patient to be transferred to facility by: Facility transport Name of family member notified: Selena Lesser ( message left) Patient and family notified of of transfer: 06/22/23  Discharge Plan and Services Additional resources added to the After Visit Summary for                            [facility] Arranged: PT, OT [facility] Agency: [facility]  ([facility]) Date HH Agency Contacted: 06/22/23 Time HH Agency Contacted: 1000 Representative spoke with at [facility] Agency: Morrie Sheldon  Social Determinants of Health (SDOH) Interventions SDOH Screenings   Food Insecurity: No Food Insecurity (06/19/2023)  Housing: Low Risk  (06/19/2023)  Transportation Needs: No Transportation Needs (06/19/2023)  Utilities: Not At Risk (06/19/2023)  Tobacco Use: Low Risk  (06/19/2023)     Readmission Risk Interventions     No data to display

## 2023-06-22 NOTE — Consult Note (Signed)
[facility] Customer service manager [facility]) Accountable Care Organization (ACO) [facility] Liaison Note  06/22/2023  [name] 02/20/1930 [id number]  Location: [facility] RN Hospital Liaison screened the patient remotely at [facility].  Insurance: [employer]   Joan Ingram is a 89 y.o. female who is a Primary Care Patient of Enid Baas, MD-[facility]. The patient was screened for  readmission hospitalization with noted high risk score for unplanned readmission risk with 2IP/1ED in 6 months.  The patient was assessed for potential [facility]) Care Management service needs for post hospital transition for care coordination. Review of patient's electronic medical record reveals patient was admitted for GI hemorrhage. Pt will return to her assisted living facility and level of care. The facility will continue to address pt's needs.  [facility] Care Management/Population Health does not replace or interfere with any arrangements made by the Inpatient Transition of Care team.   For questions contact:   Elliot Cousin, RN, [facility] Liaison [facility]   Population Health Office Hours MTWF  8:00 am-6:00 pm [phone] mobile [phone] [Office toll free line] Office Hours are M-F 8:30 - 5 pm [email or web address]

## 2023-06-22 NOTE — Plan of Care (Signed)

## 2023-06-22 NOTE — [place] FL2 (Signed)
[place] MEDICAID FL2 LEVEL OF CARE FORM     IDENTIFICATION  Patient Name: Joan Ingram Birthdate: Oct 25, 1929 Sex: female Admission Date (Current Location): 06/19/2023  [place] and [place] Number:  [address] [id number] [facility] Facility and Address:  [facility], [address], [place], [place] [address]      Provider Number: [id number]  Attending Physician Name and Address:  Sunnie Nielsen, DO  Relative Name and Phone Number:  Selena Lesser [phone]    Current Level of Care: Hospital (Assisted living) Recommended Level of Care: Assisted Living Facility Prior Approval Number:    Date Approved/Denied:   PASRR Number:    Discharge Plan: Other (Comment) (Assisted Living)    Current Diagnoses: Patient Active Problem List   Diagnosis Date Noted   Acute on chronic blood loss anemia 06/19/2023   Memory loss 06/19/2023   Atrial fibrillation, chronic (HCC) 06/19/2023   CKD stage 3a, GFR 45-59 ml/min (HCC) 06/19/2023   Chronic acquired lymphedema 05/29/2023   Dementia (HCC) 04/09/2023   Senile osteoporosis 04/02/2023   Acute on chronic heart failure with preserved ejection fraction (HFpEF) (HCC) 04/02/2023   Asthma, chronic 04/02/2023   Anemia 01/04/2023   Urinary tract infection without hematuria 01/04/2023   Symptomatic anemia 01/03/2023   Hyponatremia 01/03/2023   Hypokalemia 01/03/2023   Essential hypertension 03/16/2022   GERD (gastroesophageal reflux disease) 03/16/2022   Moderate pulmonary hypertension (HCC) 01/12/2022   Iron deficiency anemia 09/15/2021   Chronic diastolic CHF (congestive heart failure) (HCC) 08/04/2021   Paroxysmal atrial fibrillation (HCC) 02/26/2020   Plantar fascial fibromatosis 02/26/2020   Closed bimalleolar fracture 02/26/2020    Orientation RESPIRATION BLADDER Height & Weight     Self, Time, Situation, Place  Normal Continent Weight: 68.8 kg Height:  5' (152.4 cm)  BEHAVIORAL SYMPTOMS/MOOD  NEUROLOGICAL BOWEL NUTRITION STATUS      Continent  (See Discharge Summary)  AMBULATORY STATUS COMMUNICATION OF NEEDS Skin   Limited Assist Verbally Normal                       Personal Care Assistance Level of Assistance  Bathing, Feeding, Dressing Bathing Assistance: Limited assistance Feeding assistance: Limited assistance Dressing Assistance: Limited assistance     Functional Limitations Info  Sight, Hearing, Speech Sight Info: Adequate Hearing Info: Adequate Speech Info: Adequate    SPECIAL CARE FACTORS FREQUENCY  PT (By licensed PT), OT (By licensed OT)     PT Frequency: 3 times weekly OT Frequency: 3x weekly            Contractures Contractures Info: Not present    Additional Factors Info  Code Status, Allergies Code Status Info: DNR-Limited Allergies Info: Other, Aspirin, Astemizole, Benadryl (Diphenhydramine Hcl), Ciprofloxacin, Codeine, Doxycycline, Fexofenadine, Lansoprazole, Levofloxacin, Loratadine, Macrobid (Nitrofurantoin Macrocrystal), Morphine And Codeine, Omeprazole, Oxycodone, Pantoprazole, Penicillins, Rabeprazole, Terfenadine           Current Medications (06/22/2023):  This is the current hospital active medication list Current Facility-Administered Medications  Medication Dose Route Frequency Provider Last Rate Last Admin   acetaminophen (TYLENOL) tablet 650 mg  650 mg Oral Q6H PRN Wieting, Richard, MD       Or   acetaminophen (TYLENOL) suppository 650 mg  650 mg Rectal Q6H PRN Wieting, Richard, MD       alum & mag hydroxide-simeth (MAALOX/MYLANTA) 200-200-20 MG/5ML suspension 30 mL  30 mL Oral Q4H PRN Wieting, Richard, MD       midodrine (PROAMATINE) tablet 10 mg  10 mg Oral TID  WC Mansy, Jan A, MD   10 mg at 06/22/23 0935   mometasone-formoterol (DULERA) 200-5 MCG/ACT inhaler 2 puff  2 puff Inhalation BID Alford Highland, MD   2 puff at 06/22/23 0937   ondansetron (ZOFRAN) tablet 4 mg  4 mg Oral Q6H PRN Alford Highland, MD       Or    ondansetron [facility]) injection 4 mg  4 mg Intravenous Q6H PRN Wieting, Richard, MD       pantoprazole (PROTONIX) injection 40 mg  40 mg Intravenous Q12H Alford Highland, MD   40 mg at 06/22/23 0936   potassium chloride (KLOR-CON M) CR tablet 10 mEq  10 mEq Oral Daily Barrie Folk, RPH   10 mEq at 06/22/23 0935   torsemide (DEMADEX) tablet 10 mg  10 mg Oral Daily Sunnie Nielsen, DO   10 mg at 06/22/23 1610     Discharge Medications: Please see discharge summary for a list of discharge medications.  alum & mag hydroxide-simeth 200-200-20 MG/5ML suspension Commonly known as: MAALOX/MYLANTA Take 30 mLs by mouth every 4 (four) hours as needed for indigestion or heartburn.    fluticasone-salmeterol 250-50 MCG/ACT Aepb Commonly known as: ADVAIR Inhale 1 puff into the lungs every 12 (twelve) hours.    LORazepam 0.5 MG tablet Commonly known as: ATIVAN Take 0.25 mg by mouth daily as needed for anxiety.    midodrine 10 MG tablet Commonly known as: PROAMATINE Take 0.5 tablets (5 mg total) by mouth 3 (three) times daily with meals for 14 days.    pantoprazole 40 MG tablet Commonly known as: PROTONIX Take 1 tablet (40 mg total) by mouth 2 (two) times daily before a meal.    potassium chloride 10 MEQ tablet Commonly known as: KLOR-CON Take 1 tablet (10 mEq total) by mouth 2 (two) times daily for 5 days. What changed: when to take this    sertraline 25 MG tablet Commonly known as: ZOLOFT Take 25 mg by mouth daily.    torsemide 10 MG tablet Commonly known as: DEMADEX Take 1 tablet (10 mg total) by mouth daily as needed. as needed for up to 3 days for increased leg swelling, shortness of breath, weight gain 5+ lbs over 1-2 days. Seek medical care if these symptoms are not improving. What changed:  medication strength how much to take when to take this reasons to take this additional instructions    zinc oxide 20 % ointment Apply 1 Application topically 2 (two) times daily  as needed for irritation or diaper changes.             Relevant Imaging Results:  Relevant Lab Results:   Additional Information [id number]  Garret Reddish, RN

## 2023-06-22 NOTE — Progress Notes (Signed)
Pt is A&O x4, able to verbalize her needs. Remains stable with no concerns. New  medication midodrine being tolerated with no concerns. Metoprolol continue to be held per protocol. Pt denies any dizziness when ambulating or changing positions.

## 2023-06-22 NOTE — Progress Notes (Signed)
Attempted to call report x2, voicemail left for callback.

## 2023-06-22 NOTE — Care Management Important Message (Signed)
Important Message  Patient Details  Name: Joan Ingram MRN: [id number] Date of Birth: 09/27/1929   Important Message Given:  Yes - Medicare IM     [name] 06/22/2023, 10:47 AM

## 2023-06-22 NOTE — Evaluation (Addendum)
Physical Therapy Evaluation Patient Details Name: Joan Ingram MRN: [id number] DOB: 1930/06/19 Today's Date: 06/22/2023  History of Present Illness  presented to ER per recommendation of PCP secondary to abnormal labs; admitted for management of acute/chronic blood-loss anemia.  Clinical Impression  Patient resting in bed upon arrival to session; alert and oriented to self, location and general situation.  Fair/good insight and safety awareness; notable STM deficits.  Generalized edema throughout bilat LEs (though patient endorses improvement since admission).  Bilat UE/LE strength and ROM grossly symmetrical and WFL; no focal weakness appreciated.  Able to complete bed mobility with mod indep; sit/stand, basic transfers and gait (75') with RW, cga/close sup. Demonstrates forward flexed, kyphotic posture; partially reciprocal stepping pattern with fair step height/length; excessive R LE ER throughout gait cycle (baseline per patient). Mod SOB with gait efforts (BORG 6/10), sats >95% throughout  Appears near functional baseline; will continue to improve in functional endurance/activity tolerance with continued mobilization efforts.  Anticipate will benefit maximally from familiar environment, routine to best integrate return to PLOF. Would benefit from skilled PT to address above deficits and promote optimal return to PLOF.; recommend post-acute PT follow up as indicated by interdisciplinary care team.     Orthostatic VS for the past 24 hrs:  BP- Lying Pulse- Lying BP- Sitting Pulse- Sitting BP- Standing at 0 minutes Pulse- Standing at 0 minutes  06/22/23 1223 99/61 76 97/65 75 92/67 85           If plan is discharge home, recommend the following: A little help with bathing/dressing/bathroom;A little help with walking and/or transfers   Can travel by private vehicle        Equipment Recommendations  (has [id number])  Recommendations for Other Services       Functional Status Assessment  Patient has had a recent decline in their functional status and demonstrates the ability to make significant improvements in function in a reasonable and predictable amount of time.     Precautions / Restrictions Precautions Precautions: Fall Restrictions Weight Bearing Restrictions: No      Mobility  Bed Mobility Overal bed mobility: Modified Independent                  Transfers Overall transfer level: Needs assistance Equipment used: Rolling walker (2 wheels) Transfers: Sit to/from Stand, Bed to chair/wheelchair/BSC Sit to Stand: Contact guard assist, Supervision Stand pivot transfers: Contact guard assist, Supervision              Ambulation/Gait Ambulation/Gait assistance: Contact guard assist, Supervision Gait Distance (Feet): 75 Feet Assistive device: Rolling walker (2 wheels)         General Gait Details: forward flexed, kyphotic posture; partially reciprocal stepping pattern with fair step height/length; excessive R LE ER throughout gait cycle (baseline per patient). Mod SOB with gait efforts (BORG 6/10), sats >95% throughout  Stairs            Wheelchair Mobility     Tilt Bed    Modified Rankin (Stroke Patients Only)       Balance Overall balance assessment: Needs assistance Sitting-balance support: No upper extremity supported, Feet supported Sitting balance-Leahy Scale: Good     Standing balance support: Bilateral upper extremity supported Standing balance-Leahy Scale: Fair                               Pertinent Vitals/Pain Pain Assessment Pain Assessment: No/denies pain    Home Living  Family/patient expects to be discharged to:: Assisted living                 Home Equipment: Rollator (4 wheels);Grab bars - toilet      Prior Function Prior Level of Function : Independent/Modified Independent             Mobility Comments: Mod I with rollator for all mobility; pt described household ambulator in  past few weeks, community amb. before that. No falls history. ADLs Comments: Independent with all ADL's, sponge bathing. Needs assistance for driving.     Extremity/Trunk Assessment   Upper Extremity Assessment Upper Extremity Assessment: Overall WFL for tasks assessed    Lower Extremity Assessment Lower Extremity Assessment: Overall WFL for tasks assessed (grossly 4/5 throughout; generally edematous (though patient reports improved since admission).  No focal weakness appreciated)       Communication   Communication Communication: Hearing impairment  Cognition Arousal: Alert Behavior During Therapy: WFL for tasks assessed/performed Overall Cognitive Status: No family/caregiver present to determine baseline cognitive functioning                                 General Comments: Oriented to self, location and general situation; fair/good insight into safety needs.  Notable STM deficits        General Comments      Exercises     Assessment/Plan    PT Assessment Patient needs continued PT services  PT Problem List Decreased activity tolerance;Decreased balance;Decreased mobility;Decreased cognition;Decreased knowledge of use of DME;Decreased safety awareness;Decreased knowledge of precautions;Cardiopulmonary status limiting activity       PT Treatment Interventions DME instruction;Gait training;Functional mobility training;Therapeutic activities;Therapeutic exercise;Balance training;Patient/family education    PT Goals (Current goals can be found in the Care Plan section)  Acute Rehab PT Goals Patient Stated Goal: to go home! PT Goal Formulation: With patient Time For Goal Achievement: 07/06/23 Potential to Achieve Goals: Good    Frequency Min 1X/week     Co-evaluation               AM-PAC PT "6 Clicks" Mobility  Outcome Measure Help needed turning from your back to your side while in a flat bed without using bedrails?: None Help needed  moving from lying on your back to sitting on the side of a flat bed without using bedrails?: None Help needed moving to and from a bed to a chair (including a wheelchair)?: A Little Help needed standing up from a chair using your arms (e.g., wheelchair or bedside chair)?: A Little Help needed to walk in hospital room?: A Little Help needed climbing 3-5 steps with a railing? : A Little 6 Click Score: 20    End of Session Equipment Utilized During Treatment: Gait belt Activity Tolerance: Patient tolerated treatment well Patient left: in chair;with call bell/phone within reach;with chair alarm set Nurse Communication: Mobility status PT Visit Diagnosis: Muscle weakness (generalized) (M62.81);Difficulty in walking, not elsewhere classified (R26.2)    Time: 2440-1027 PT Time Calculation (min) (ACUTE ONLY): 26 min   Charges:   PT Evaluation $PT Eval Moderate Complexity: 1 Mod   PT General Charges $$ ACUTE PT VISIT: 1 Visit         Shellye Zandi H. Manson Passey, PT, DPT, NCS 06/22/23, 12:30 PM [phone]

## 2023-06-22 NOTE — Discharge Summary (Signed)
Physician Discharge Summary   Patient: Joan Ingram MRN: [id number]  DOB: [birth year]   Admit:     Date of Admission: 06/19/2023 Admitted from: assisted living   Discharge: Date of discharge: 06/22/23 Disposition: Assisted living Condition at discharge: fair  CODE STATUS: DNR - [place] form signed and placed in chart 06/22/23      Discharge Physician: Sunnie Nielsen, DO [facility]     PCP: Enid Baas, MD  Recommendations for Outpatient Follow-up:  Follow up with PCP Enid Baas, MD in 1 weeks See directly below for further instructions    Discharge Instructions     Diet - low sodium heart healthy   Complete by: As directed    Discharge instructions   Complete by: As directed    Vital signs daily, need to clocely monitor BP and HR for the next 7 days.   Needs follow up with PCP or check-up with facility RN/provider to  1) monitor BP and HR adjust meds as needed (particularly since we held metoprolol and changed torsemide to prn, added midodrine) 2) monitor CBC within 3-4 days d/t likely GI bleed and pt has declined GI scope intervention, may require periodic transfusions PRBC / palliative care follow up   Increase activity slowly   Complete by: As directed          Discharge Diagnoses: Principal Problem:   Acute on chronic blood loss anemia Active Problems:   Chronic diastolic CHF (congestive heart failure) (HCC)   Chronic acquired lymphedema   Essential hypertension   GERD (gastroesophageal reflux disease)   Memory loss   Atrial fibrillation, chronic (HCC)   CKD stage 3a, GFR 45-59 ml/min [facility])       Hospital Course:  HPI:  Joan Ingram is a 89 y.o. female with medical history significant of paroxysmal atrial fibrillation, iron deficiency anemia essential hypertension acid reflux and asthma.  She was called by her primary care physician to come into the hospital secondary to low blood counts.  Her ferritin  level was 35.  Hemoglobin 6.3.  Patient did have some black stool.  Patient takes Xarelto at home.  Patient not complaining of chest pain or shortness of breath or dizziness or lightheadedness. She states that she has had 1 small black bowel movement a few days back , non after, denies any hematemesis or abdominal pain or nsaid use. No other complaints.   Hospital course / significant events:  10/01: to ED from assisted living.1 unit PRBC transfused. GI saw pt, she declines endoscopy, GI has s/o.  10/02: Hgb still 6.3, pending 2nd unit PRBC 10/03: Hgb 7.1, given likely slow bleed will given another 1 unit PRBC today for goal 8+ and if stable can anticipate d/c tomorrow AM 10/04: tolerating ambulation, tolerating diet, stable for discharge Hgb 8.1  Consultants:  Gastroenterology   Procedures/Surgeries: none      ASSESSMENT & PLAN:   Acute on chronic blood loss anemia Concern for GI bleed - melena and FOBT(+) in ED  Patient's hemoglobin 6.3 on admission. S/p 3 unit of packed red blood cells GI consulted but patient declined endoscopy. I revisited this discussion again 10/02, pt reported no scope desired. We discussed if she starts to bleed profusely we may need to revisit this conversation and if she still doesn't want scope at that time, would transition to comfort measures only - will respect informed refusal of scope, but if cannot stop the bleeding at its source will not give massive transfusion  PRBC to no benefit, patient given time to process but seems agreeable to this, states "if it's my time, it's my time" Hold Xarelto  Monitor CBC again in 3-4 days from discharge or sooner as needed    Chronic diastolic CHF (congestive heart failure) (HCC) Not in exacerbation  changing torsemide from daily to prn - hold for low BP Holding beta blocker and other GDMT d/t low BP  Follow outpatient w/ PCP/cardiology    Chronic acquired lymphedema changing torsemide from daily to prn - hold for  low BP   Essential hypertension Hypotensive here concern for low blood volume holding metoprolol d/t low BP, restart outpatient as appropriate  Started midodrine here    CKD stage 3a, GFR 45-59 ml/min (HCC) Monitor BMP outpatient    Atrial fibrillation, chronic (HCC) Holding metoprolol d/t low BP, rate has been appropriate Holding Xarelto.d/t likely GI bleed   Memory loss Suspect mild cognitive impairment.   Follow-up with PCP.   Checked B12, RPR and TSH.   GERD (gastroesophageal reflux disease) Protonix             Discharge Instructions  Allergies as of 06/22/2023       Reactions   Other Anaphylaxis   Aspirin    Astemizole Other (See Comments)   Benadryl [diphenhydramine Hcl]    Ciprofloxacin    Codeine    Doxycycline    Fexofenadine Other (See Comments)   Lansoprazole Diarrhea   Levofloxacin Swelling   Loratadine Other (See Comments)   Macrobid [nitrofurantoin Macrocrystal]    Morphine And Codeine    Omeprazole Diarrhea   Oxycodone    Pantoprazole Diarrhea   Penicillins    Rabeprazole Diarrhea   Terfenadine Swelling        Medication List     STOP taking these medications    metoprolol tartrate 25 MG tablet Commonly known as: LOPRESSOR   Rivaroxaban 15 MG Tabs tablet Commonly known as: XARELTO       TAKE these medications    alum & mag hydroxide-simeth 200-200-20 MG/5ML suspension Commonly known as: MAALOX/MYLANTA Take 30 mLs by mouth every 4 (four) hours as needed for indigestion or heartburn.   fluticasone-salmeterol 250-50 MCG/ACT Aepb Commonly known as: ADVAIR Inhale 1 puff into the lungs every 12 (twelve) hours.   LORazepam 0.5 MG tablet Commonly known as: ATIVAN Take 0.25 mg by mouth daily as needed for anxiety.   midodrine 10 MG tablet Commonly known as: PROAMATINE Take 0.5 tablets (5 mg total) by mouth 3 (three) times daily with meals for 14 days.   pantoprazole 40 MG tablet Commonly known as: PROTONIX Take 1  tablet (40 mg total) by mouth 2 (two) times daily before a meal.   potassium chloride 10 MEQ tablet Commonly known as: KLOR-CON Take 1 tablet (10 mEq total) by mouth 2 (two) times daily for 5 days. What changed: when to take this   sertraline 25 MG tablet Commonly known as: ZOLOFT Take 25 mg by mouth daily.   torsemide 10 MG tablet Commonly known as: DEMADEX Take 1 tablet (10 mg total) by mouth daily as needed. as needed for up to 3 days for increased leg swelling, shortness of breath, weight gain 5+ lbs over 1-2 days. Seek medical care if these symptoms are not improving. What changed:  medication strength how much to take when to take this reasons to take this additional instructions   zinc oxide 20 % ointment Apply 1 Application topically 2 (two) times daily as needed for  irritation or diaper changes.          Allergies  Allergen Reactions   Other Anaphylaxis   Aspirin    Astemizole Other (See Comments)   Benadryl [Diphenhydramine Hcl]    Ciprofloxacin    Codeine    Doxycycline    Fexofenadine Other (See Comments)   Lansoprazole Diarrhea   Levofloxacin Swelling   Loratadine Other (See Comments)   Macrobid [Nitrofurantoin Macrocrystal]    Morphine And Codeine    Omeprazole Diarrhea   Oxycodone    Pantoprazole Diarrhea   Penicillins    Rabeprazole Diarrhea   Terfenadine Swelling     Subjective: pt reports feeling well this morning, is walking to the bathroom on her own, no dizziness or lightheadedness, no palpitations.    Discharge Exam: BP (!) 92/57 (BP Location: Right Arm)   Pulse 80   Temp 98.2 F (36.8 C) (Oral)   Resp 18   Ht 5' (1.524 m)   Wt 68.8 kg   SpO2 95%   BMI 29.62 kg/m  General: Pt is alert, awake, not in acute distress Cardiovascular: RRR, S1/S2 +, no rubs, no gallops Respiratory: CTA bilaterally, no wheezing, no rhonchi Abdominal: Soft, NT, ND, bowel sounds + Extremities: no edema, no cyanosis     The results of  significant diagnostics from this hospitalization (including imaging, microbiology, ancillary and laboratory) are listed below for reference.     Microbiology: No results found for this or any previous visit (from the past 240 hour(s)).   Labs: BNP (last 3 results) Recent Labs    07/24/22 0515 02/28/23 1612 04/02/23 1625  BNP 176.4* 251.1* 160.6*   Basic Metabolic Panel: Recent Labs  Lab 06/19/23 0959 06/20/23 0449 06/21/23 0454  NA 134* 136 138  K 3.7 3.8 3.8  CL 98 102 104  CO2 27 28 27   GLUCOSE 122* 93 106*  BUN 21 22 18   CREATININE 1.21* 1.21* 1.01*  CALCIUM 8.7* 8.4* 8.5*   Liver Function Tests: No results for input(s): "AST", "ALT", "ALKPHOS", "BILITOT", "PROT", "ALBUMIN" in the last 168 hours. No results for input(s): "LIPASE", "AMYLASE" in the last 168 hours. No results for input(s): "AMMONIA" in the last 168 hours. CBC: Recent Labs  Lab 06/19/23 0959 06/20/23 0449 06/20/23 1558 06/21/23 0454 06/21/23 1913  WBC 5.8 4.5  --  5.0  --   NEUTROABS 3.9  --   --   --   --   HGB 6.3* 6.3* 7.2* 7.1* 8.2*  HCT 20.4* 20.4* 22.5* 21.9* 25.5*  MCV 94.9 96.7  --  92.8  --   PLT 358 305  --  278  --    Cardiac Enzymes: No results for input(s): "CKTOTAL", "CKMB", "CKMBINDEX", "TROPONINI" in the last 168 hours. BNP: Invalid input(s): "POCBNP" CBG: No results for input(s): "GLUCAP" in the last 168 hours. D-Dimer No results for input(s): "DDIMER" in the last 72 hours. Hgb A1c No results for input(s): "HGBA1C" in the last 72 hours. Lipid Profile No results for input(s): "CHOL", "HDL", "LDLCALC", "TRIG", "CHOLHDL", "LDLDIRECT" in the last 72 hours. Thyroid function studies Recent Labs    06/20/23 0449  TSH 4.592*   Anemia work up Recent Labs    06/20/23 0449  VITAMINB12 308   Urinalysis    Component Value Date/Time   COLORURINE YELLOW (A) 04/04/2023 1411   APPEARANCEUR CLEAR (A) 04/04/2023 1411   APPEARANCEUR Hazy 11/16/2013 1150   LABSPEC 1.006  04/04/2023 1411   LABSPEC 1.012 11/16/2013 1150   PHURINE 7.0  04/04/2023 1411   GLUCOSEU NEGATIVE 04/04/2023 1411   GLUCOSEU Negative 11/16/2013 1150   HGBUR SMALL (A) 04/04/2023 1411   BILIRUBINUR NEGATIVE 04/04/2023 1411   BILIRUBINUR Negative 11/16/2013 1150   KETONESUR NEGATIVE 04/04/2023 1411   PROTEINUR NEGATIVE 04/04/2023 1411   NITRITE NEGATIVE 04/04/2023 1411   LEUKOCYTESUR MODERATE (A) 04/04/2023 1411   LEUKOCYTESUR 3+ 11/16/2013 1150   Sepsis Labs Recent Labs  Lab 06/19/23 0959 06/20/23 0449 06/21/23 0454  WBC 5.8 4.5 5.0   Microbiology No results found for this or any previous visit (from the past 240 hour(s)). Imaging No results found.    Time coordinating discharge: oer 30 minutes  SIGNED:  Sunnie Nielsen DO [facility]

## 2023-06-23 LAB — TYPE AND SCREEN
ABO/RH(D): A POS
Antibody Screen: POSITIVE
DAT, IgG: NEGATIVE
DAT, complement: NEGATIVE
PT AG Type: NEGATIVE
Unit division: 0
Unit division: 0
Unit division: 0
Unit division: 0
Unit division: 0

## 2023-06-23 LAB — BPAM RBC
Blood Product Expiration Date: 202410102359
Blood Product Expiration Date: 202410132359
Blood Product Expiration Date: 202410292359
Blood Product Expiration Date: 202410292359
Blood Product Expiration Date: 202410302359
ISSUE DATE / TIME: 202410011653
ISSUE DATE / TIME: 202410021218
ISSUE DATE / TIME: 202410031443
Unit Type and Rh: 5100
Unit Type and Rh: 5100
Unit Type and Rh: 6200
Unit Type and Rh: 6200
Unit Type and Rh: 6200

## 2023-06-26 DIAGNOSIS — R600 Localized edema: Secondary | ICD-10-CM | POA: Diagnosis not present

## 2023-06-26 DIAGNOSIS — I10 Essential (primary) hypertension: Secondary | ICD-10-CM | POA: Diagnosis not present

## 2023-06-26 DIAGNOSIS — R5381 Other malaise: Secondary | ICD-10-CM | POA: Diagnosis not present

## 2023-06-26 DIAGNOSIS — D62 Acute posthemorrhagic anemia: Secondary | ICD-10-CM | POA: Diagnosis not present

## 2023-06-26 DIAGNOSIS — I872 Venous insufficiency (chronic) (peripheral): Secondary | ICD-10-CM | POA: Diagnosis not present

## 2023-06-26 DIAGNOSIS — L308 Other specified dermatitis: Secondary | ICD-10-CM | POA: Diagnosis not present

## 2023-06-26 DIAGNOSIS — E559 Vitamin D deficiency, unspecified: Secondary | ICD-10-CM | POA: Diagnosis not present

## 2023-06-26 DIAGNOSIS — K219 Gastro-esophageal reflux disease without esophagitis: Secondary | ICD-10-CM | POA: Diagnosis not present

## 2023-06-26 DIAGNOSIS — I509 Heart failure, unspecified: Secondary | ICD-10-CM | POA: Diagnosis not present

## 2023-06-26 DIAGNOSIS — J45909 Unspecified asthma, uncomplicated: Secondary | ICD-10-CM | POA: Diagnosis not present

## 2023-06-26 DIAGNOSIS — N189 Chronic kidney disease, unspecified: Secondary | ICD-10-CM | POA: Diagnosis not present

## 2023-06-26 DIAGNOSIS — I480 Paroxysmal atrial fibrillation: Secondary | ICD-10-CM | POA: Diagnosis not present

## 2023-06-28 DIAGNOSIS — Z515 Encounter for palliative care: Secondary | ICD-10-CM | POA: Diagnosis not present

## 2023-07-02 DIAGNOSIS — D519 Vitamin B12 deficiency anemia, unspecified: Secondary | ICD-10-CM | POA: Diagnosis not present

## 2023-07-02 DIAGNOSIS — E119 Type 2 diabetes mellitus without complications: Secondary | ICD-10-CM | POA: Diagnosis not present

## 2023-07-02 DIAGNOSIS — E782 Mixed hyperlipidemia: Secondary | ICD-10-CM | POA: Diagnosis not present

## 2023-07-02 DIAGNOSIS — Z79899 Other long term (current) drug therapy: Secondary | ICD-10-CM | POA: Diagnosis not present

## 2023-07-04 ENCOUNTER — Other Ambulatory Visit: Payer: Self-pay

## 2023-07-04 ENCOUNTER — Emergency Department: Payer: PPO

## 2023-07-04 ENCOUNTER — Emergency Department
Admission: EM | Admit: 2023-07-04 | Discharge: 2023-07-04 | Disposition: A | Discharge status: Home / Self Care | Payer: PPO | Attending: Emergency Medicine | Admitting: Emergency Medicine

## 2023-07-04 DIAGNOSIS — M7989 Other specified soft tissue disorders: Secondary | ICD-10-CM

## 2023-07-04 DIAGNOSIS — R600 Localized edema: Secondary | ICD-10-CM | POA: Diagnosis not present

## 2023-07-04 DIAGNOSIS — I4891 Unspecified atrial fibrillation: Secondary | ICD-10-CM | POA: Diagnosis not present

## 2023-07-04 DIAGNOSIS — K449 Diaphragmatic hernia without obstruction or gangrene: Secondary | ICD-10-CM | POA: Diagnosis not present

## 2023-07-04 DIAGNOSIS — I509 Heart failure, unspecified: Secondary | ICD-10-CM | POA: Insufficient documentation

## 2023-07-04 DIAGNOSIS — I110 Hypertensive heart disease with heart failure: Secondary | ICD-10-CM | POA: Diagnosis not present

## 2023-07-04 DIAGNOSIS — R609 Edema, unspecified: Secondary | ICD-10-CM | POA: Diagnosis not present

## 2023-07-04 DIAGNOSIS — R918 Other nonspecific abnormal finding of lung field: Secondary | ICD-10-CM | POA: Diagnosis not present

## 2023-07-04 LAB — BASIC METABOLIC PANEL
Anion gap: 7 (ref 5–15)
BUN: 14 mg/dL (ref 8–23)
CO2: 25 mmol/L (ref 22–32)
Calcium: 9.1 mg/dL (ref 8.9–10.3)
Chloride: 104 mmol/L (ref 98–111)
Creatinine, Ser: 1.16 mg/dL — ABNORMAL HIGH (ref 0.44–1.00)
GFR, Estimated: 45 mL/min — ABNORMAL LOW (ref >60)
Glucose, Bld: 102 mg/dL — ABNORMAL HIGH (ref 70–99)
Potassium: 3.7 mmol/L (ref 3.5–5.1)
Sodium: 136 mmol/L (ref 135–145)

## 2023-07-04 LAB — PROTIME-INR
INR: 1.3 — ABNORMAL HIGH (ref 0.8–1.2)
Prothrombin Time: 15.9 s — ABNORMAL HIGH (ref 11.4–15.2)

## 2023-07-04 LAB — CBC
HCT: 31.9 % — ABNORMAL LOW (ref 36.0–46.0)
Hemoglobin: 9.5 g/dL — ABNORMAL LOW (ref 12.0–15.0)
MCH: 28.3 pg (ref 26.0–34.0)
MCHC: 29.8 g/dL — ABNORMAL LOW (ref 30.0–36.0)
MCV: 94.9 fL (ref 80.0–100.0)
Platelets: 273 10*3/uL (ref 150–400)
RBC: 3.36 MIL/uL — ABNORMAL LOW (ref 3.87–5.11)
RDW: 15.9 % — ABNORMAL HIGH (ref 11.5–15.5)
WBC: 4.5 10*3/uL (ref 4.0–10.5)
nRBC: 0 % (ref 0.0–0.2)

## 2023-07-04 LAB — BRAIN NATRIURETIC PEPTIDE: B Natriuretic Peptide: 341.1 pg/mL — ABNORMAL HIGH (ref 0.0–100.0)

## 2023-07-04 LAB — TROPONIN I (HIGH SENSITIVITY): Troponin I (High Sensitivity): 25 ng/L — ABNORMAL HIGH (ref <18)

## 2023-07-04 MED ORDER — FUROSEMIDE 10 MG/ML IJ SOLN
60.0000 mg | Freq: Once | INTRAMUSCULAR | Status: AC
  Administered 2023-07-04: 60 mg via INTRAVENOUS
  Filled 2023-07-04: qty 8

## 2023-07-04 NOTE — ED Notes (Signed)
Patient assisted to toilet by this RN. Patient voided clear, yellow urine. Patient returned to bed and given call light.

## 2023-07-04 NOTE — ED Notes (Signed)
Abby RN assisted pt to toilet, stand by assist x2 with ultrasound tech. X1 void, new brief applied. Pt pants removed for ultrasound. Ultrasound at bedside.

## 2023-07-04 NOTE — ED Triage Notes (Signed)
Arrived by EMS from [facility]. Excessive fluid build up in bilateral legs.  History CHF and A-fib  EMS vitals: 92HR 97% RA 122/72 b/p

## 2023-07-04 NOTE — ED Notes (Signed)
Spoke with Joan Ingram at pt's facility and she will come and pick pt up.

## 2023-07-04 NOTE — ED Notes (Signed)
Assisted pt to the toilet and into wheelchair awaiting transport.

## 2023-07-04 NOTE — ED Notes (Signed)
This RN attempted to call facility x3.

## 2023-07-04 NOTE — ED Provider Notes (Signed)
[facility] Provider Note    Event Date/Time   First MD Initiated Contact with Patient 07/04/23 1528     (approximate)   History   Leg Swelling   HPI  Joan Ingram is a 89 y.o. female who presents to the emergency department today because of concerns for bilateral leg swelling.  She states that she has been having issues with swelling for a long time but it has been worse over the past 3 days.  The swelling does make it hard for her to walk.  She denies any significant pain.  She did have recent hospitalization for GI bleed.  Patient denies any shortness of breath.  She denies any fevers.     Physical Exam   Triage Vital Signs: ED Triage Vitals  Encounter Vitals Group     BP 07/04/23 1406 122/72     Systolic BP Percentile --      Diastolic BP Percentile --      Pulse Rate 07/04/23 1406 72     Resp 07/04/23 1406 20     Temp 07/04/23 1406 97.8 F (36.6 C)     Temp Source 07/04/23 1406 Oral     SpO2 07/04/23 1406 94 %     Weight 07/04/23 1544 150 lb (68 kg)     Height 07/04/23 1544 5' (1.524 m)     Head Circumference --      Peak Flow --      Pain Score 07/04/23 1407 0     Pain Loc --      Pain Education --      Exclude from Growth Chart --     Most recent vital signs: Vitals:   07/04/23 1406  BP: 122/72  Pulse: 72  Resp: 20  Temp: 97.8 F (36.6 C)  SpO2: 94%   General: Awake, alert, oriented. CV:  Good peripheral perfusion. Irregular rate and rhythm. Resp:  Normal effort. Lungs clear. Abd:  No distention. Non tender. Other:  Bilateral lower extremity edema.    ED Results / Procedures / Treatments   Labs (all labs ordered are listed, but only abnormal results are displayed) Labs Reviewed  BASIC METABOLIC PANEL - Abnormal; Notable for the following components:      Result Value   Glucose, Bld 102 (*)    Creatinine, Ser 1.16 (*)    GFR, Estimated 45 (*)    All other components within normal limits  CBC - Abnormal;  Notable for the following components:   RBC 3.36 (*)    Hemoglobin 9.5 (*)    HCT 31.9 (*)    MCHC 29.8 (*)    RDW 15.9 (*)    All other components within normal limits  PROTIME-INR - Abnormal; Notable for the following components:   Prothrombin Time 15.9 (*)    INR 1.3 (*)    All other components within normal limits  BRAIN NATRIURETIC PEPTIDE  TROPONIN I (HIGH SENSITIVITY)     EKG  I, Phineas Semen, attending physician, personally viewed and interpreted this EKG  EKG Time: 1422 Rate: 81 Rhythm: atrial fibrillation Axis: left axis deviation Intervals: qtc 457 QRS: narrow ST changes: no st elevation Impression: abnormal ekg   RADIOLOGY I independently interpreted and visualized the CXR. My interpretation: Cardiomegaly Radiology interpretation:  IMPRESSION:  *Increased interstitial markings throughout bilateral lungs which  may represent pulmonary edema.  *Moderate-to-large hiatal hernia.   US venous lower extremities  IMPRESSION:  No evidence of DVT within either lower  extremity.     PROCEDURES:  Critical Care performed: No   MEDICATIONS ORDERED IN ED: Medications - No data to display   IMPRESSION / MDM / ASSESSMENT AND PLAN / ED COURSE  I reviewed the triage vital signs and the nursing notes.                              Differential diagnosis includes, but is not limited to, CHF exacerbation, DVT  Patient's presentation is most consistent with acute presentation with potential threat to life or bodily function.   The patient is on the cardiac monitor to evaluate for evidence of arrhythmia and/or significant heart rate changes.  Patient presents to the emergency department today because of concerns for bilateral lower extremity edema.  The patient states that she has history of the same in the past. Given recent hospitalization would have some concern for possible DVT making the swelling worse. Will check blood work and [place]. Will go ahead and give  lasix to start diuresis.   Blood work with slight elevation of BNP.  Troponin is minimally elevated however consistent with patient's baseline.  Patient did start diuresing here in the emergency department after IV Lasix.  At this time given the lack of shortness of breath I think it is reasonable for patient to be discharged home.  Will give patient heart failure clinic follow-up information.      FINAL CLINICAL IMPRESSION(S) / ED DIAGNOSES   Final diagnoses:  Leg swelling  Congestive heart failure, unspecified HF chronicity, unspecified heart failure type [facility])     Note:  This document was prepared using Dragon voice recognition software and may include unintentional dictation errors.    Phineas Semen, MD 07/04/23 [phone]

## 2023-07-04 NOTE — ED Triage Notes (Signed)
See first nurse note, pt legs swollen and weeping. Pt A&Ox4

## 2023-07-04 NOTE — Discharge Instructions (Signed)
Please seek medical attention for any high fevers, chest pain, shortness of breath, change in behavior, persistent vomiting, bloody stool or any other new or concerning symptoms.  

## 2023-07-04 NOTE — ED Notes (Signed)
RN Abby assisted pt to bathroom, x1 void. Pt back into bed and updated to call pt family for a possible ride.

## 2023-07-05 DIAGNOSIS — Z79899 Other long term (current) drug therapy: Secondary | ICD-10-CM | POA: Diagnosis not present

## 2023-07-05 DIAGNOSIS — D519 Vitamin B12 deficiency anemia, unspecified: Secondary | ICD-10-CM | POA: Diagnosis not present

## 2023-07-05 DIAGNOSIS — E038 Other specified hypothyroidism: Secondary | ICD-10-CM | POA: Diagnosis not present

## 2023-07-05 DIAGNOSIS — E782 Mixed hyperlipidemia: Secondary | ICD-10-CM | POA: Diagnosis not present

## 2023-07-05 DIAGNOSIS — E559 Vitamin D deficiency, unspecified: Secondary | ICD-10-CM | POA: Diagnosis not present

## 2023-07-05 DIAGNOSIS — E119 Type 2 diabetes mellitus without complications: Secondary | ICD-10-CM | POA: Diagnosis not present

## 2023-07-07 DIAGNOSIS — R609 Edema, unspecified: Secondary | ICD-10-CM | POA: Diagnosis not present

## 2023-07-09 ENCOUNTER — Encounter (INDEPENDENT_AMBULATORY_CARE_PROVIDER_SITE_OTHER): Payer: Self-pay | Admitting: Vascular Surgery

## 2023-07-09 NOTE — Progress Notes (Deleted)
MRN : [id number]  Joan Ingram is a 89 y.o. ([birth year]) female who presents with chief complaint of legs swell.  History of Present Illness:     No outpatient medications have been marked as taking for the 07/09/23 encounter (Appointment) with Gilda Crease, Latina Craver, MD.    Past Medical History:  Diagnosis Date   Asthma    Esophageal reflux    Essential hypertension    IDA (iron deficiency anemia)    Paroxysmal atrial fibrillation (HCC)    Senile osteoporosis     Past Surgical History:  Procedure Laterality Date   CARDIOVERSION N/A 04/21/2020   Procedure: CARDIOVERSION;  Surgeon: Dalia Heading, MD;  Location: [facility] ORS;  Service: Cardiovascular;  Laterality: N/A;   colonoscopy      Social History Social History   Tobacco Use   Smoking status: Never   Smokeless tobacco: Never  Substance Use Topics   Alcohol use: No   Drug use: Never    Family History Family History  Problem Relation Age of Onset   CAD Mother     Allergies  Allergen Reactions   Other Anaphylaxis   Aspirin    Astemizole Other (See Comments)   Benadryl [Diphenhydramine Hcl]    Ciprofloxacin    Codeine    Doxycycline    Fexofenadine Other (See Comments)   Lansoprazole Diarrhea   Levofloxacin Swelling   Loratadine Other (See Comments)   Macrobid [Nitrofurantoin Macrocrystal]    Morphine And Codeine    Omeprazole Diarrhea   Oxycodone    Pantoprazole Diarrhea   Penicillins    Rabeprazole Diarrhea   Terfenadine Swelling     REVIEW OF SYSTEMS (Negative unless checked)  Constitutional: [] Weight loss  [] Fever  [] Chills Cardiac: [] Chest pain   [] Chest pressure   [] Palpitations   [] Shortness of breath when laying flat   [] Shortness of breath with exertion. Vascular:  [] Pain in legs with walking   [x] Pain in legs with standing  [] History of DVT   [] Phlebitis   [x] Swelling in legs   [] Varicose veins   [] Non-healing ulcers Pulmonary:   [] Uses home oxygen    [] Productive cough   [] Hemoptysis   [] Wheeze  [] COPD   [] Asthma Neurologic:  [] Dizziness   [] Seizures   [] History of stroke   [] History of TIA  [] Aphasia   [] Vissual changes   [] Weakness or numbness in arm   [] Weakness or numbness in leg Musculoskeletal:   [] Joint swelling   [] Joint pain   [] Low back pain Hematologic:  [] Easy bruising  [] Easy bleeding   [] Hypercoagulable state   [] Anemic Gastrointestinal:  [] Diarrhea   [] Vomiting  [] Gastroesophageal reflux/heartburn   [] Difficulty swallowing. Genitourinary:  [] Chronic kidney disease   [] Difficult urination  [] Frequent urination   [] Blood in urine Skin:  [] Rashes   [] Ulcers  Psychological:  [] History of anxiety   []  History of major depression.  Physical Examination  There were no vitals filed for this visit. There is no height or weight on file to calculate BMI. Gen: WD/WN, NAD Head: [place]/AT, No temporalis wasting.  Ear/Nose/Throat: Hearing grossly intact, nares w/o erythema or drainage, pinna without lesions Eyes: PER, EOMI, sclera nonicteric.  Neck: Supple, no gross masses.  No JVD.  Pulmonary:  Good air movement, no audible wheezing, no use of accessory muscles.  Cardiac: RRR, precordium not hyperdynamic. Vascular:  scattered  varicosities present bilaterally.  Mild venous stasis changes to the legs bilaterally.  3-4+ soft pitting edema, CEAP [id number]  Vessel Right Left  Radial Palpable Palpable  Gastrointestinal: soft, non-distended. No guarding/no peritoneal signs.  Musculoskeletal: M/S 5/5 throughout.  No deformity.  Neurologic: CN 2-12 intact. Pain and light touch intact in extremities.  Symmetrical.  Speech is fluent. Motor exam as listed above. Psychiatric: Judgment intact, Mood & affect appropriate for pt's clinical situation. Dermatologic: Venous rashes no ulcers noted.  No changes consistent with cellulitis. Lymph : No lichenification or skin changes of chronic lymphedema.  CBC Lab Results  Component Value Date   WBC 4.5  07/04/2023   HGB 9.5 (L) 07/04/2023   HCT 31.9 (L) 07/04/2023   MCV 94.9 07/04/2023   PLT 273 07/04/2023    BMET    Component Value Date/Time   NA 136 07/04/2023 1410   NA 137 11/17/2013 0417   K 3.7 07/04/2023 1410   K 3.9 11/17/2013 0417   CL 104 07/04/2023 1410   CL 104 11/17/2013 0417   CO2 25 07/04/2023 1410   CO2 27 11/17/2013 0417   GLUCOSE 102 (H) 07/04/2023 1410   GLUCOSE 138 (H) 11/17/2013 0417   BUN 14 07/04/2023 1410   BUN 12 11/17/2013 0417   CREATININE 1.16 (H) 07/04/2023 1410   CREATININE 0.78 11/17/2013 0417   CALCIUM 9.1 07/04/2023 1410   CALCIUM 8.5 11/17/2013 0417   GFRNONAA 45 (L) 07/04/2023 1410   GFRNONAA >60 11/17/2013 0417   GFRAA >60 11/17/2013 0417   Estimated Creatinine Clearance: 27.7 mL/min (A) (by C-G formula based on SCr of 1.16 mg/dL (H)).  COAG Lab Results  Component Value Date   INR 1.3 (H) 07/04/2023   INR 1.3 (H) 02/28/2023   INR 1.1 11/16/2013    Radiology US Venous Img Lower Bilateral  Result Date: 07/04/2023 CLINICAL DATA:  Bilateral lower extremity edema.  Evaluate for DVT. EXAM: BILATERAL LOWER EXTREMITY VENOUS DOPPLER ULTRASOUND TECHNIQUE: Gray-scale sonography with graded compression, as well as color Doppler and duplex ultrasound were performed to evaluate the lower extremity deep venous systems from the level of the common femoral vein and including the common femoral, femoral, profunda femoral, popliteal and calf veins including the posterior tibial, peroneal and gastrocnemius veins when visible. The superficial great saphenous vein was also interrogated. Spectral Doppler was utilized to evaluate flow at rest and with distal augmentation maneuvers in the common femoral, femoral and popliteal veins. COMPARISON:  Bilateral lower extremity venous Doppler ultrasound-04/04/2023 (negative). FINDINGS: RIGHT LOWER EXTREMITY Common Femoral Vein: No evidence of thrombus. Normal compressibility, respiratory phasicity and response to  augmentation. Saphenofemoral Junction: No evidence of thrombus. Normal compressibility and flow on color Doppler imaging. Profunda Femoral Vein: No evidence of thrombus. Normal compressibility and flow on color Doppler imaging. Femoral Vein: No evidence of thrombus. Normal compressibility, respiratory phasicity and response to augmentation. Popliteal Vein: No evidence of thrombus. Normal compressibility, respiratory phasicity and response to augmentation. Calf Veins: Appear patent where visualized. Superficial Great Saphenous Vein: No evidence of thrombus. Normal compressibility. Other Findings:  None. LEFT LOWER EXTREMITY Common Femoral Vein: No evidence of thrombus. Normal compressibility, respiratory phasicity and response to augmentation. Saphenofemoral Junction: No evidence of thrombus. Normal compressibility and flow on color Doppler imaging. Profunda Femoral Vein: No evidence of thrombus. Normal compressibility and flow on color Doppler imaging. Femoral Vein: No evidence of thrombus. Normal compressibility, respiratory phasicity and response to augmentation. Popliteal Vein: No evidence of thrombus. Normal compressibility, respiratory phasicity and response  to augmentation. Calf Veins: Appear patent where visualized. Superficial Great Saphenous Vein: No evidence of thrombus. Normal compressibility. Other Findings:  None. IMPRESSION: No evidence of DVT within either lower extremity. Electronically Signed   By: Simonne Come M.D.   On: 07/04/2023 17:57   DG Chest 2 View  Result Date: 07/04/2023 CLINICAL DATA:  Leg swelling. EXAM: CHEST - 2 VIEW COMPARISON:  04/02/2023. FINDINGS: Increased interstitial markings throughout bilateral lungs. There is stable left retrocardiac opacity. Bilateral lungs are otherwise clear. Bilateral costophrenic angles are clear. No pleural effusion or pneumothorax. Stable mildly enlarged cardio-mediastinal silhouette. There is moderate-to-large retrocardiac opacity, which  corresponds to moderate-to-large hiatal hernia. No acute osseous abnormalities. The soft tissues are within normal limits. IMPRESSION: *Increased interstitial markings throughout bilateral lungs which may represent pulmonary edema. *Moderate-to-large hiatal hernia. Electronically Signed   By: Jules Schick M.D.   On: 07/04/2023 16:57     Assessment/Plan There are no diagnoses linked to this encounter.   Levora Dredge, MD  07/09/2023 8:53 AM

## 2023-07-10 DIAGNOSIS — R451 Restlessness and agitation: Secondary | ICD-10-CM | POA: Diagnosis not present

## 2023-07-10 DIAGNOSIS — R600 Localized edema: Secondary | ICD-10-CM | POA: Diagnosis not present

## 2023-07-10 DIAGNOSIS — N189 Chronic kidney disease, unspecified: Secondary | ICD-10-CM | POA: Diagnosis not present

## 2023-07-10 DIAGNOSIS — I509 Heart failure, unspecified: Secondary | ICD-10-CM | POA: Diagnosis not present

## 2023-07-10 DIAGNOSIS — I480 Paroxysmal atrial fibrillation: Secondary | ICD-10-CM | POA: Diagnosis not present

## 2023-07-10 DIAGNOSIS — K219 Gastro-esophageal reflux disease without esophagitis: Secondary | ICD-10-CM | POA: Diagnosis not present

## 2023-07-10 DIAGNOSIS — F0390 Unspecified dementia without behavioral disturbance: Secondary | ICD-10-CM | POA: Diagnosis not present

## 2023-07-10 DIAGNOSIS — I10 Essential (primary) hypertension: Secondary | ICD-10-CM | POA: Diagnosis not present

## 2023-07-10 DIAGNOSIS — E559 Vitamin D deficiency, unspecified: Secondary | ICD-10-CM | POA: Diagnosis not present

## 2023-07-10 DIAGNOSIS — D649 Anemia, unspecified: Secondary | ICD-10-CM | POA: Diagnosis not present

## 2023-07-10 DIAGNOSIS — L308 Other specified dermatitis: Secondary | ICD-10-CM | POA: Diagnosis not present

## 2023-07-11 DIAGNOSIS — F419 Anxiety disorder, unspecified: Secondary | ICD-10-CM | POA: Diagnosis not present

## 2023-07-11 DIAGNOSIS — F4323 Adjustment disorder with mixed anxiety and depressed mood: Secondary | ICD-10-CM | POA: Diagnosis not present

## 2023-07-12 DIAGNOSIS — I70223 Atherosclerosis of native arteries of extremities with rest pain, bilateral legs: Secondary | ICD-10-CM | POA: Diagnosis not present

## 2023-07-15 DIAGNOSIS — I10 Essential (primary) hypertension: Secondary | ICD-10-CM | POA: Diagnosis not present

## 2023-07-17 DIAGNOSIS — I77811 Abdominal aortic ectasia: Secondary | ICD-10-CM | POA: Diagnosis not present

## 2023-07-19 DIAGNOSIS — E119 Type 2 diabetes mellitus without complications: Secondary | ICD-10-CM | POA: Diagnosis not present

## 2023-07-19 DIAGNOSIS — D518 Other vitamin B12 deficiency anemias: Secondary | ICD-10-CM | POA: Diagnosis not present

## 2023-07-19 DIAGNOSIS — D519 Vitamin B12 deficiency anemia, unspecified: Secondary | ICD-10-CM | POA: Diagnosis not present

## 2023-07-19 DIAGNOSIS — E038 Other specified hypothyroidism: Secondary | ICD-10-CM | POA: Diagnosis not present

## 2023-07-19 DIAGNOSIS — I6523 Occlusion and stenosis of bilateral carotid arteries: Secondary | ICD-10-CM | POA: Diagnosis not present

## 2023-07-19 DIAGNOSIS — E782 Mixed hyperlipidemia: Secondary | ICD-10-CM | POA: Diagnosis not present

## 2023-07-19 DIAGNOSIS — I480 Paroxysmal atrial fibrillation: Secondary | ICD-10-CM | POA: Diagnosis not present

## 2023-07-19 DIAGNOSIS — R0989 Other specified symptoms and signs involving the circulatory and respiratory systems: Secondary | ICD-10-CM | POA: Diagnosis not present

## 2023-07-19 DIAGNOSIS — I10 Essential (primary) hypertension: Secondary | ICD-10-CM | POA: Diagnosis not present

## 2023-07-19 DIAGNOSIS — D508 Other iron deficiency anemias: Secondary | ICD-10-CM | POA: Diagnosis not present

## 2023-07-19 DIAGNOSIS — I509 Heart failure, unspecified: Secondary | ICD-10-CM | POA: Diagnosis not present

## 2023-07-19 DIAGNOSIS — E559 Vitamin D deficiency, unspecified: Secondary | ICD-10-CM | POA: Diagnosis not present

## 2023-07-19 DIAGNOSIS — M810 Age-related osteoporosis without current pathological fracture: Secondary | ICD-10-CM | POA: Diagnosis not present

## 2023-07-24 DIAGNOSIS — E559 Vitamin D deficiency, unspecified: Secondary | ICD-10-CM | POA: Diagnosis not present

## 2023-07-24 DIAGNOSIS — R5381 Other malaise: Secondary | ICD-10-CM | POA: Diagnosis not present

## 2023-07-24 DIAGNOSIS — F0390 Unspecified dementia without behavioral disturbance: Secondary | ICD-10-CM | POA: Diagnosis not present

## 2023-07-24 DIAGNOSIS — R600 Localized edema: Secondary | ICD-10-CM | POA: Diagnosis not present

## 2023-07-24 DIAGNOSIS — D649 Anemia, unspecified: Secondary | ICD-10-CM | POA: Diagnosis not present

## 2023-07-24 DIAGNOSIS — J45909 Unspecified asthma, uncomplicated: Secondary | ICD-10-CM | POA: Diagnosis not present

## 2023-07-24 DIAGNOSIS — I509 Heart failure, unspecified: Secondary | ICD-10-CM | POA: Diagnosis not present

## 2023-07-24 DIAGNOSIS — I480 Paroxysmal atrial fibrillation: Secondary | ICD-10-CM | POA: Diagnosis not present

## 2023-07-24 DIAGNOSIS — L308 Other specified dermatitis: Secondary | ICD-10-CM | POA: Diagnosis not present

## 2023-07-24 DIAGNOSIS — R451 Restlessness and agitation: Secondary | ICD-10-CM | POA: Diagnosis not present

## 2023-07-24 DIAGNOSIS — K219 Gastro-esophageal reflux disease without esophagitis: Secondary | ICD-10-CM | POA: Diagnosis not present

## 2023-07-24 DIAGNOSIS — I10 Essential (primary) hypertension: Secondary | ICD-10-CM | POA: Diagnosis not present

## 2023-08-07 DIAGNOSIS — I480 Paroxysmal atrial fibrillation: Secondary | ICD-10-CM | POA: Diagnosis not present

## 2023-08-07 DIAGNOSIS — R451 Restlessness and agitation: Secondary | ICD-10-CM | POA: Diagnosis not present

## 2023-08-07 DIAGNOSIS — E559 Vitamin D deficiency, unspecified: Secondary | ICD-10-CM | POA: Diagnosis not present

## 2023-08-07 DIAGNOSIS — F0390 Unspecified dementia without behavioral disturbance: Secondary | ICD-10-CM | POA: Diagnosis not present

## 2023-08-07 DIAGNOSIS — N189 Chronic kidney disease, unspecified: Secondary | ICD-10-CM | POA: Diagnosis not present

## 2023-08-07 DIAGNOSIS — D649 Anemia, unspecified: Secondary | ICD-10-CM | POA: Diagnosis not present

## 2023-08-07 DIAGNOSIS — R600 Localized edema: Secondary | ICD-10-CM | POA: Diagnosis not present

## 2023-08-07 DIAGNOSIS — K219 Gastro-esophageal reflux disease without esophagitis: Secondary | ICD-10-CM | POA: Diagnosis not present

## 2023-08-07 DIAGNOSIS — L308 Other specified dermatitis: Secondary | ICD-10-CM | POA: Diagnosis not present

## 2023-08-07 DIAGNOSIS — J45909 Unspecified asthma, uncomplicated: Secondary | ICD-10-CM | POA: Diagnosis not present

## 2023-08-07 DIAGNOSIS — I509 Heart failure, unspecified: Secondary | ICD-10-CM | POA: Diagnosis not present

## 2023-08-07 DIAGNOSIS — I10 Essential (primary) hypertension: Secondary | ICD-10-CM | POA: Diagnosis not present

## 2023-08-08 DIAGNOSIS — F4323 Adjustment disorder with mixed anxiety and depressed mood: Secondary | ICD-10-CM | POA: Diagnosis not present

## 2023-08-08 DIAGNOSIS — F419 Anxiety disorder, unspecified: Secondary | ICD-10-CM | POA: Diagnosis not present

## 2023-08-14 DIAGNOSIS — D649 Anemia, unspecified: Secondary | ICD-10-CM | POA: Diagnosis not present

## 2023-08-14 DIAGNOSIS — L308 Other specified dermatitis: Secondary | ICD-10-CM | POA: Diagnosis not present

## 2023-08-14 DIAGNOSIS — K219 Gastro-esophageal reflux disease without esophagitis: Secondary | ICD-10-CM | POA: Diagnosis not present

## 2023-08-14 DIAGNOSIS — E559 Vitamin D deficiency, unspecified: Secondary | ICD-10-CM | POA: Diagnosis not present

## 2023-08-14 DIAGNOSIS — I872 Venous insufficiency (chronic) (peripheral): Secondary | ICD-10-CM | POA: Diagnosis not present

## 2023-08-14 DIAGNOSIS — N189 Chronic kidney disease, unspecified: Secondary | ICD-10-CM | POA: Diagnosis not present

## 2023-08-14 DIAGNOSIS — I480 Paroxysmal atrial fibrillation: Secondary | ICD-10-CM | POA: Diagnosis not present

## 2023-08-14 DIAGNOSIS — R600 Localized edema: Secondary | ICD-10-CM | POA: Diagnosis not present

## 2023-08-14 DIAGNOSIS — I509 Heart failure, unspecified: Secondary | ICD-10-CM | POA: Diagnosis not present

## 2023-08-14 DIAGNOSIS — R197 Diarrhea, unspecified: Secondary | ICD-10-CM | POA: Diagnosis not present

## 2023-08-14 DIAGNOSIS — I10 Essential (primary) hypertension: Secondary | ICD-10-CM | POA: Diagnosis not present

## 2023-08-14 DIAGNOSIS — J45909 Unspecified asthma, uncomplicated: Secondary | ICD-10-CM | POA: Diagnosis not present

## 2023-08-15 DIAGNOSIS — M810 Age-related osteoporosis without current pathological fracture: Secondary | ICD-10-CM | POA: Diagnosis not present

## 2023-08-15 DIAGNOSIS — I10 Essential (primary) hypertension: Secondary | ICD-10-CM | POA: Diagnosis not present

## 2023-08-15 DIAGNOSIS — I509 Heart failure, unspecified: Secondary | ICD-10-CM | POA: Diagnosis not present

## 2023-08-15 DIAGNOSIS — E038 Other specified hypothyroidism: Secondary | ICD-10-CM | POA: Diagnosis not present

## 2023-08-15 DIAGNOSIS — D518 Other vitamin B12 deficiency anemias: Secondary | ICD-10-CM | POA: Diagnosis not present

## 2023-08-15 DIAGNOSIS — E782 Mixed hyperlipidemia: Secondary | ICD-10-CM | POA: Diagnosis not present

## 2023-08-15 DIAGNOSIS — D519 Vitamin B12 deficiency anemia, unspecified: Secondary | ICD-10-CM | POA: Diagnosis not present

## 2023-08-15 DIAGNOSIS — I480 Paroxysmal atrial fibrillation: Secondary | ICD-10-CM | POA: Diagnosis not present

## 2023-08-15 DIAGNOSIS — E559 Vitamin D deficiency, unspecified: Secondary | ICD-10-CM | POA: Diagnosis not present

## 2023-08-15 DIAGNOSIS — E119 Type 2 diabetes mellitus without complications: Secondary | ICD-10-CM | POA: Diagnosis not present

## 2023-08-15 DIAGNOSIS — I70223 Atherosclerosis of native arteries of extremities with rest pain, bilateral legs: Secondary | ICD-10-CM | POA: Diagnosis not present

## 2023-08-15 DIAGNOSIS — D508 Other iron deficiency anemias: Secondary | ICD-10-CM | POA: Diagnosis not present

## 2023-08-17 DIAGNOSIS — I10 Essential (primary) hypertension: Secondary | ICD-10-CM | POA: Diagnosis not present

## 2023-08-27 DIAGNOSIS — R194 Change in bowel habit: Secondary | ICD-10-CM | POA: Diagnosis not present

## 2023-08-27 DIAGNOSIS — R195 Other fecal abnormalities: Secondary | ICD-10-CM | POA: Diagnosis not present

## 2023-08-27 DIAGNOSIS — R1906 Epigastric swelling, mass or lump: Secondary | ICD-10-CM | POA: Diagnosis not present

## 2023-08-27 DIAGNOSIS — R197 Diarrhea, unspecified: Secondary | ICD-10-CM | POA: Diagnosis not present

## 2023-08-28 DIAGNOSIS — F419 Anxiety disorder, unspecified: Secondary | ICD-10-CM | POA: Diagnosis not present

## 2023-08-28 DIAGNOSIS — F4323 Adjustment disorder with mixed anxiety and depressed mood: Secondary | ICD-10-CM | POA: Diagnosis not present

## 2023-10-05 DIAGNOSIS — K567 Ileus, unspecified: Secondary | ICD-10-CM | POA: Diagnosis not present

## 2023-10-09 DIAGNOSIS — I872 Venous insufficiency (chronic) (peripheral): Secondary | ICD-10-CM | POA: Diagnosis not present

## 2023-10-09 DIAGNOSIS — M79672 Pain in left foot: Secondary | ICD-10-CM | POA: Diagnosis not present

## 2023-10-09 DIAGNOSIS — B351 Tinea unguium: Secondary | ICD-10-CM | POA: Diagnosis not present

## 2023-10-09 DIAGNOSIS — L600 Ingrowing nail: Secondary | ICD-10-CM | POA: Diagnosis not present

## 2023-10-09 DIAGNOSIS — I739 Peripheral vascular disease, unspecified: Secondary | ICD-10-CM | POA: Diagnosis not present

## 2023-10-09 DIAGNOSIS — M2040 Other hammer toe(s) (acquired), unspecified foot: Secondary | ICD-10-CM | POA: Diagnosis not present

## 2023-10-09 DIAGNOSIS — M79671 Pain in right foot: Secondary | ICD-10-CM | POA: Diagnosis not present

## 2023-10-09 DIAGNOSIS — I83893 Varicose veins of bilateral lower extremities with other complications: Secondary | ICD-10-CM | POA: Diagnosis not present

## 2023-12-18 ENCOUNTER — Emergency Department

## 2023-12-18 ENCOUNTER — Emergency Department: Admission: EM | Admit: 2023-12-18 | Discharge: 2023-12-18 | Disposition: A | Discharge status: Skilled Nursing Facility

## 2023-12-18 DIAGNOSIS — R600 Localized edema: Secondary | ICD-10-CM | POA: Diagnosis present

## 2023-12-18 DIAGNOSIS — N189 Chronic kidney disease, unspecified: Secondary | ICD-10-CM | POA: Insufficient documentation

## 2023-12-18 DIAGNOSIS — J45909 Unspecified asthma, uncomplicated: Secondary | ICD-10-CM | POA: Insufficient documentation

## 2023-12-18 DIAGNOSIS — T783XXA Angioneurotic edema, initial encounter: Secondary | ICD-10-CM | POA: Diagnosis not present

## 2023-12-18 LAB — CBC
HCT: 30.8 % — ABNORMAL LOW (ref 36.0–46.0)
Hemoglobin: 10.2 g/dL — ABNORMAL LOW (ref 12.0–15.0)
MCH: 31.1 pg (ref 26.0–34.0)
MCHC: 33.1 g/dL (ref 30.0–36.0)
MCV: 93.9 fL (ref 80.0–100.0)
Platelets: 243 10*3/uL (ref 150–400)
RBC: 3.28 MIL/uL — ABNORMAL LOW (ref 3.87–5.11)
RDW: 14.9 % (ref 11.5–15.5)
WBC: 6.2 10*3/uL (ref 4.0–10.5)
nRBC: 0 % (ref 0.0–0.2)

## 2023-12-18 LAB — BASIC METABOLIC PANEL WITH GFR
Anion gap: 10 (ref 5–15)
BUN: 19 mg/dL (ref 8–23)
CO2: 27 mmol/L (ref 22–32)
Calcium: 9.5 mg/dL (ref 8.9–10.3)
Chloride: 99 mmol/L (ref 98–111)
Creatinine, Ser: 1.04 mg/dL — ABNORMAL HIGH (ref 0.44–1.00)
GFR, Estimated: 51 mL/min — ABNORMAL LOW (ref >60)
Glucose, Bld: 81 mg/dL (ref 70–99)
Potassium: 3.7 mmol/L (ref 3.5–5.1)
Sodium: 136 mmol/L (ref 135–145)

## 2023-12-18 LAB — TROPONIN I (HIGH SENSITIVITY)
Troponin I (High Sensitivity): 24 ng/L — ABNORMAL HIGH (ref <18)
Troponin I (High Sensitivity): 22 ng/L — ABNORMAL HIGH (ref <18)

## 2023-12-18 MED ORDER — PREDNISONE 20 MG PO TABS
40.0000 mg | ORAL_TABLET | Freq: Every day | ORAL | 0 refills | Status: AC
Start: 2023-12-19 — End: 2023-12-22

## 2023-12-18 MED ORDER — METHYLPREDNISOLONE SODIUM SUCC 125 MG IJ SOLR
125.0000 mg | Freq: Once | INTRAMUSCULAR | Status: AC
  Administered 2023-12-18: 125 mg via INTRAVENOUS
  Filled 2023-12-18: qty 2

## 2023-12-18 MED ORDER — EPINEPHRINE 0.3 MG/0.3ML IJ SOAJ
0.3000 mg | Freq: Once | INTRAMUSCULAR | Status: AC
  Administered 2023-12-18: 0.3 mg via INTRAMUSCULAR
  Filled 2023-12-18: qty 0.3

## 2023-12-18 MED ORDER — EPINEPHRINE 0.3 MG/0.3ML IJ SOAJ: 0.3000 mg | INTRAMUSCULAR | 0 refills | Status: AC | PRN

## 2023-12-18 NOTE — ED Notes (Signed)
 Pt waiting on ride from [facility].

## 2023-12-18 NOTE — ED Provider Notes (Signed)
 [facility] Provider Note    Event Date/Time   First MD Initiated Contact with Patient 12/18/23 1055     (approximate)   History   Facial Swelling   HPI  Joan Ingram is a 89 y.o. female medical comorbidities including paroxysmal A-fib, asthma, HFpEF, anemia, CKD presents for evaluation of facial swelling -Primarily to upper and lower lips, also has noted some tongue swelling -Denies any difficulty breathing or swallowing -No recent medication changes -Does have a long list of allergies in our chart though no clear recent exposures -No rash, vomiting, shortness of breath  Per chart review, has been noted to have angioedema in the past of unclear etiology.  Responded well to Benadryl and steroids at that time and was discharged home from ED.  Subsequently have Benadryl listed as an allergy.  Collateral later gathered from caregiver from facility bedside.  Physical Exam   Triage Vital Signs: ED Triage Vitals  Encounter Vitals Group     BP 12/18/23 1038 111/64     Systolic BP Percentile --      Diastolic BP Percentile --      Pulse Rate 12/18/23 1038 74     Resp 12/18/23 1038 16     Temp 12/18/23 1041 97.9 F (36.6 C)     Temp Source 12/18/23 1041 Oral     SpO2 12/18/23 1038 100 %     Weight 12/18/23 1038 130 lb (59 kg)     Height 12/18/23 1038 5' (1.524 m)     Head Circumference --      Peak Flow --      Pain Score 12/18/23 1038 6     Pain Loc --      Pain Education --      Exclude from Growth Chart --     Most recent vital signs: Vitals:   12/18/23 1600 12/18/23 1630  BP: (!) 133/90 112/77  Pulse: (!) 106 81  Resp: 20 14  Temp:    SpO2: 99% 96%     General: Awake, no distress.  ENT:   Moderate swelling of upper and lower lips, mild-moderate tongue swelling appreciated.  No neck swelling, lymphadenopathy, pooling of secretions, obvious voice change.  CV:  Good peripheral perfusion. RRR, RP 2+. Resp:  Normal effort. CTAB, no  wheezing Abd:  No distention. No TTP throughout.  Skin:  No rash   ED Results / Procedures / Treatments   Labs (all labs ordered are listed, but only abnormal results are displayed) Labs Reviewed  BASIC METABOLIC PANEL WITH GFR - Abnormal; Notable for the following components:      Result Value   Creatinine, Ser 1.04 (*)    GFR, Estimated 51 (*)    All other components within normal limits  CBC - Abnormal; Notable for the following components:   RBC 3.28 (*)    Hemoglobin 10.2 (*)    HCT 30.8 (*)    All other components within normal limits  TROPONIN I (HIGH SENSITIVITY) - Abnormal; Notable for the following components:   Troponin I (High Sensitivity) 24 (*)    All other components within normal limits  TROPONIN I (HIGH SENSITIVITY) - Abnormal; Notable for the following components:   Troponin I (High Sensitivity) 22 (*)    All other components within normal limits     EKG  Afib, rate 81, no ST elevation or depression, no significant repolarization abnormality, normal axis   RADIOLOGY Chest x-ray interpreted by myself, no acute  pathology appreciated.  Radiology report reviewed.   PROCEDURES:  Critical Care performed: Yes, see critical care procedure note(s)  .Critical Care  Performed by: Marinell Blight, MD Authorized by: Marinell Blight, MD   Critical care provider statement:    Critical care time (minutes):  30   Critical care time was exclusive of:  Separately billable procedures and treating other patients   Critical care was time spent personally by me on the following activities:  Development of treatment plan with patient or surrogate, evaluation of patient's response to treatment, examination of patient, obtaining history from patient or surrogate, ordering and performing treatments and interventions, ordering and review of laboratory studies, ordering and review of radiographic studies, pulse oximetry, re-evaluation of patient's condition and review of old  charts   I assumed direction of critical care for this patient from another provider in my specialty: no      MEDICATIONS ORDERED IN ED: Medications  methylPREDNISolone sodium succinate (SOLU-MEDROL) 125 mg/2 mL injection 125 mg (125 mg Intravenous Given 12/18/23 1139)  EPINEPHrine (EPI-PEN) injection 0.3 mg (0.3 mg Intramuscular Given 12/18/23 1134)     IMPRESSION / MDM / ASSESSMENT AND PLAN / ED COURSE  I reviewed the triage vital signs and the nursing notes.                              Differential diagnosis includes, but is not limited to, anaphylaxis, medication side effect, hereditary angioedema, do not clinically suspect underlying infection at this time.  Patient's presentation is most consistent with acute presentation with potential threat to life or bodily function.  The patient is on the cardiac monitor to evaluate for evidence of arrhythmia and/or significant heart rate changes.  ED course below.  Patient treated empirically with epinephrine as well as IV methylprednisolone given notable angioedema.  Fortunately not clinically decompensating at this time though does have risk of future airway compromise.  No acute distress, having full conversation with me--will monitor closely and evaluate response.  Low threshold for intubation if worsening.  Can also trial TXA, FFP if not responding appropriately.  Avoiding Benadryl given history of allergy.  Patient responded well above treatment, swelling was persistent though notably improved on serial reevaluations.  Monitored patient in emergency department for about 6 hours.  Remains satting well on room air with no respiratory compromise.  Will prescribe a short course of prednisone as well as an EpiPen for use as needed.  No culprit medications identified on this visit.  No clear allergic precipitant.  Will plan for return to facility where she can continue to be monitored.  ED return precautions in place.  Clinical Course as of  12/18/23 1716  Tue Dec 18, 2023  1257 Patient reevaluated, has had interval decrease in swelling though remains persistent.  Satting well on room air.  No wheezing.  Will continue to monitor closely. [MM]  1304 Chest x-ray unremarkable my read.  Formal radiology read below  CXR: IMPRESSION: *No active cardiopulmonary disease. *Redemonstration of moderate sized retrocardiac hiatal hernia.   [MM]  1340 Troponin stable, persistently has mildly elevated troponins, not new today [MM]  1416 Reevaluated, feels her facial swelling has improved.  No recurrent hypoxia, satting well on room air.  Does have caregiver from facility bedside. [MM]  1429 Patient has been laying flat for about 15 minutes, no further desaturations, satting 100% on room air.  Does symptomatically feel improved and swelling is notably  improved on my reeval.  Has had unspecified poor reactions to Benadryl in the past--will continue on a course of steroids. [MM]    Clinical Course User Index [MM] Marinell Blight, MD     FINAL CLINICAL IMPRESSION(S) / ED DIAGNOSES   Final diagnoses:  Angioedema of lips, initial encounter     Rx / DC Orders   ED Discharge Orders          Ordered    predniSONE (DELTASONE) 20 MG tablet  Daily with breakfast        12/18/23 1458    EPINEPHrine 0.3 mg/0.3 mL IJ SOAJ injection  As needed        12/18/23 1458             Note:  This document was prepared using Dragon voice recognition software and may include unintentional dictation errors.   Marinell Blight, MD 12/18/23 [phone]

## 2023-12-18 NOTE — Discharge Instructions (Addendum)
 Evaluation in the emergency department was notable for swelling around your mouth.  This did improve with steroids and an injection of an EpiPen.  I am unsure as to the exact cause of your symptoms, but I do recommend we continue you on steroids for the next 4 days to help prevent any recurrence or worsening of symptoms.  Also continue to take your famotidine as already prescribed.  I will also prescribe an EpiPen for use for any recurrence of your symptoms.  Please do follow-up closely with your doctor, and return to the emergency department with any new or worsening symptoms.

## 2023-12-18 NOTE — ED Triage Notes (Addendum)
 Pt to ED via [facility] from [facility]. Last night staff noticed that pt was having some swelling in face and lips. EMS reports that it has gotten worse since lasst night. Facility also reports pt has been having mild confusion. EMS reports tongue swelling and slurred speech. Pt reporting chest pain since last night on the right side radiating to the back. Pt alert and oriented x4 at this time. Pt denies any difficulty breathing. Pt reports this happening previously due to being allergic to something. No known encounters with any allergies.  Pt is on hospice.   SPO2 99% RA  BP 114/72 RR16  HR 90 bpm

## 2024-01-03 DIAGNOSIS — F419 Anxiety disorder, unspecified: Secondary | ICD-10-CM | POA: Diagnosis not present

## 2024-01-16 ENCOUNTER — Encounter: Payer: Self-pay | Admitting: Oncology

## 2024-01-24 DIAGNOSIS — F419 Anxiety disorder, unspecified: Secondary | ICD-10-CM | POA: Diagnosis not present

## 2024-02-05 ENCOUNTER — Encounter (INDEPENDENT_AMBULATORY_CARE_PROVIDER_SITE_OTHER): Payer: Self-pay

## 2024-02-27 DIAGNOSIS — F419 Anxiety disorder, unspecified: Secondary | ICD-10-CM | POA: Diagnosis not present

## 2024-02-27 DIAGNOSIS — F0394 Unspecified dementia, unspecified severity, with anxiety: Secondary | ICD-10-CM | POA: Diagnosis not present

## 2024-05-05 DIAGNOSIS — F411 Generalized anxiety disorder: Secondary | ICD-10-CM | POA: Diagnosis not present

## 2024-05-05 DIAGNOSIS — F03918 Unspecified dementia, unspecified severity, with other behavioral disturbance: Secondary | ICD-10-CM | POA: Diagnosis not present

## 2024-05-05 DIAGNOSIS — F03911 Unspecified dementia, unspecified severity, with agitation: Secondary | ICD-10-CM | POA: Diagnosis not present

## 2024-05-05 DIAGNOSIS — F0394 Unspecified dementia, unspecified severity, with anxiety: Secondary | ICD-10-CM | POA: Diagnosis not present
